# Patient Record
Sex: Male | Born: 1995 | Race: White | Hispanic: No | Marital: Single | Smoking: Former smoker
Health system: Southern US, Community
[De-identification: ages and names within clinical notes are randomized; demographics above are authoritative.]

## PROBLEM LIST (undated history)

## (undated) DIAGNOSIS — K219 Gastro-esophageal reflux disease without esophagitis: Secondary | ICD-10-CM

## (undated) DIAGNOSIS — F909 Attention-deficit hyperactivity disorder, unspecified type: Secondary | ICD-10-CM

## (undated) DIAGNOSIS — F32A Depression, unspecified: Secondary | ICD-10-CM

## (undated) DIAGNOSIS — F329 Major depressive disorder, single episode, unspecified: Secondary | ICD-10-CM

## (undated) HISTORY — DX: Gastro-esophageal reflux disease without esophagitis: K21.9

## (undated) HISTORY — PX: WISDOM TOOTH EXTRACTION: SHX21

## (undated) HISTORY — DX: Depression, unspecified: F32.A

## (undated) HISTORY — DX: Major depressive disorder, single episode, unspecified: F32.9

---
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
## 2014-02-17 ENCOUNTER — Encounter: Payer: Self-pay | Admitting: Pediatrics

## 2014-05-04 ENCOUNTER — Ambulatory Visit (INDEPENDENT_AMBULATORY_CARE_PROVIDER_SITE_OTHER): Payer: 59 | Admitting: Pediatrics

## 2014-05-04 VITALS — BP 108/70 | Ht 66.0 in | Wt 192.0 lb

## 2014-05-04 DIAGNOSIS — Z0000 Encounter for general adult medical examination without abnormal findings: Secondary | ICD-10-CM

## 2014-05-04 NOTE — Progress Notes (Signed)
   Subjective:    Patient ID: Jordan Christian, male    DOB: [birth year], 18 y.o.   MRN: [id number]  HPI nearly 18 year old male here to have forms filled out for getting his driver's license with history of substance abuse 34 years ago. He no longer is known any substance abuse medications or alcohol. He is followed by psychiatrist and is on ADHD medicine.    Review of Systems noncontributory     Objective:   Physical Exam  General:   alert and active  Skin:   no rash  Oral cavity:   moist mucous membranes, no lesion  Eyes:   sclerae white, no injected conjunctiva  Nose:  no discharge  Ears:   normal bilaterally TM  Neck:   no adenopathy  Lungs:  clear to auscultation bilaterally and no increased work of breathing  Heart:   regular rate and rhythm and no murmur           Neuro:  normal without focal findings            Assessment & Plan:  Exam for driver's license see history of present illness Plan: For spelled out for driver's license concerning physician's approval for any medical problems

## 2016-12-17 ENCOUNTER — Encounter (HOSPITAL_COMMUNITY): Payer: Self-pay | Admitting: Psychiatry

## 2016-12-17 ENCOUNTER — Ambulatory Visit (INDEPENDENT_AMBULATORY_CARE_PROVIDER_SITE_OTHER): Payer: 59 | Admitting: Psychiatry

## 2016-12-17 VITALS — BP 126/74 | HR 72 | Ht 66.0 in | Wt 212.4 lb

## 2016-12-17 DIAGNOSIS — F1290 Cannabis use, unspecified, uncomplicated: Secondary | ICD-10-CM | POA: Diagnosis not present

## 2016-12-17 DIAGNOSIS — Z818 Family history of other mental and behavioral disorders: Secondary | ICD-10-CM | POA: Diagnosis not present

## 2016-12-17 DIAGNOSIS — F39 Unspecified mood [affective] disorder: Secondary | ICD-10-CM

## 2016-12-17 DIAGNOSIS — F1099 Alcohol use, unspecified with unspecified alcohol-induced disorder: Secondary | ICD-10-CM

## 2016-12-17 DIAGNOSIS — Z79899 Other long term (current) drug therapy: Secondary | ICD-10-CM

## 2016-12-17 DIAGNOSIS — Z0000 Encounter for general adult medical examination without abnormal findings: Secondary | ICD-10-CM | POA: Diagnosis not present

## 2016-12-17 DIAGNOSIS — IMO0002 Reserved for concepts with insufficient information to code with codable children: Secondary | ICD-10-CM

## 2016-12-17 DIAGNOSIS — F17210 Nicotine dependence, cigarettes, uncomplicated: Secondary | ICD-10-CM

## 2016-12-17 DIAGNOSIS — Z811 Family history of alcohol abuse and dependence: Secondary | ICD-10-CM

## 2016-12-17 MED ORDER — NALTREXONE HCL 50 MG PO TABS: 25.0000 mg | ORAL_TABLET | Freq: Every day | ORAL | 2 refills | Status: DC

## 2016-12-17 MED ORDER — MIRTAZAPINE 15 MG PO TABS: 15.0000 mg | ORAL_TABLET | Freq: Every day | ORAL | 2 refills | Status: DC

## 2016-12-17 NOTE — Patient Instructions (Signed)
Take Naltrexone to reduce alcohol cravings EVERY MORNING  Take Remeron for sleep and mood EVERY NIGHT  Come back in about 6 weeks  Lets get you set up with a therapist intake assessment  Get the labs done in the next week or so

## 2016-12-17 NOTE — Progress Notes (Signed)
Psychiatric Initial Adult Assessment   Patient Identification: Jordan Christian MRN:  [id number] Date of Evaluation:  12/17/2016 Referral Source: self Chief Complaint:  anxiety, sleep problems Visit Diagnosis:    ICD-9-CM ICD-10-CM   1. Mood disorder (HCC) 296.90 F39 mirtazapine (REMERON) 15 MG tablet     Lipid Profile     Hemoglobin A1c     CBC     Comprehensive Metabolic Panel (CMET)     TSH  2. Alcohol use disorder (HCC) 305.00 F10.99 naltrexone (DEPADE) 50 MG tablet     Lipid Profile     Hemoglobin A1c     CBC     Comprehensive Metabolic Panel (CMET)     TSH  3. Well adult on routine health check V70.0 Z00.00 Ambulatory referral to Family Practice    History of Present Illness:  Jordan Christian is a 21 year old male with a past psychiatric history of ADHD, and multiple psychiatric hospitalizations while he was a child. He presents 40 minutes late for his appointment, and reports that he was waiting for his girlfriend to get out of her school course at [employer] because she would keep him honest.  He reports that he had been adopted at age 43, and struggled with behavioral acting out, cutting, irritability, and impulsive behaviors until he was around 35-19 years old. He had been on Seroquel as a child and gained 100 pounds within a year, so he discontinued it. He was managed on Concerta and clonidine up until about 2 years ago, but its been off of psychiatric medications for the past 2 years, as he felt like he can deal with things like his attention and focus on his own.  He presents today for worsening mood, irritability, sleep difficulties. This is in the context of being recently laid off, and having financial stressors. He lives with his girlfriend of 18 months, and they have 2 dogs, no children. She is present for the assessment, and provides collateral. He reports that after he got laid off, he started drinking heavily for the past 3 months, generally about a sixpack of beer a night. He  reports that he would also smoke marijuana daily. This helped with his sleep and anxiety. He reports that he has been clean for the past 2 weeks and has attended some groups at his church.  He is interested in both individual and group therapies in this clinic for alcohol use treatment and depression and anxiety treatment.    He reports that he continues to struggle with irritability, and being rude to his girlfriend. He sometimes throws things when he gets frustrated, but not at her. She reports that he is generally fine 90% of the time, but then he'll be irritable or cranky when it comes to work her financial stressors. He has trouble going to sleep due to rumination and worry. He is not as interested in the things he used to like such as exercising. He used to be quite physically fit and muscular, but has gained 20-30 pounds with drinking alcohol and sitting around the house more. He has low self worth given that he is not working and on unemployment. His concentration is fair, but he reports that he feels like he doesn't have anything to concentrate on, because he has nothing to do during the day. He denies any suicidal thoughts or attempts. He denies any homicidal thoughts or aggressive thoughts. He has no history of auditory visual hallucinations. Reviewing his psychiatric history, he has no history of mania  or bipolar-like episodes.  He reports that he has a biological family history of bipolar disorder and alcohol use disorder. He is interested in medications for his anxiety and sleep. We discussed the use of Remeron, the risks and benefits, and the expected effects. He agrees to initiate this medication. We also discussed the use of naltrexone for alcohol cessation and reduction of cravings. He reports that he would be very interested in starting this medication as well. We reviewed the risks and benefits of this, and expected side effects. He confirms that he is not taking any opiate her pain  medications.  He denies any marijuana use for the past 2-1/2 weeks. He reports that he does continue to have alcohol cravings over the past 2-3 weeks, and his girlfriend has had to stop him multiple times from starting to drink a beer.  Discussed therapy options in this clinic, and patient agrees to initiate individual therapy intake assessment. He is also interested in groups, but would like to start with individual therapy first. They agree to follow-up with this writer in 6 weeks.  Associated Signs/Symptoms: Depression Symptoms:  depressed mood, anhedonia, insomnia, fatigue, feelings of worthlessness/guilt, difficulty concentrating, anxiety, disturbed sleep, (Hypo) Manic Symptoms:  none Anxiety Symptoms:  Excessive Worry, Psychotic Symptoms:  none PTSD Symptoms: Negative  He denies any childhood sexual or physical trauma. He reports that his adoptive parents were always very kind to him. He reports that the only stressful. She had gone through was when his older sister, who is also adopted, but biologically his sister, attempted to kill herself  Past Psychiatric History: Childhood psychiatric history of hospitalizations and medication management for acting out behaviors and ADHD.  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  alcohol and marijuana  Consequences of Substance Abuse: Family Consequences:  Strife between him and his girlfriend Withdrawal Symptoms:   Anxiety, irritability, poor appetite  Past Medical History:  Past Medical History:  Diagnosis Date  . Depression    History reviewed. No pertinent surgical history.  Family Psychiatric History: Family psychiatric history of alcohol use disorder and bipolar disorder in mom  Family History:  Family History  Problem Relation Age of Onset  . Adopted:  Yes    Social History:   Social History   Social History  . Marital status: Single    Spouse name: N/A  . Number of children: N/A  . Years of education: N/A   Social History Main Topics  . Smoking status: Current Every Day Smoker    Types: E-cigarettes  . Smokeless tobacco: Never Used  . Alcohol use No  . Drug use: Yes    Types: Marijuana     Comment: patient no longer using  . Sexual activity: Yes    Birth control/ protection: Condom   Other Topics Concern  . None   Social History Narrative  . 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 loratadine (CLARITIN) 10 MG tablet Take 10 mg by mouth daily.    Marland Kitchen  mirtazapine (REMERON) 15 MG tablet Take 1 tablet (15 mg total) by mouth at bedtime. 30 tablet 2  . Multiple Vitamin (MULTIVITAMIN WITH MINERALS) TABS tablet Take 1 tablet by mouth daily.    . naltrexone (DEPADE) 50 MG tablet Take 0.5 tablets (25 mg total) by mouth daily. 30 tablet 2   No current facility-administered medications for this visit.     Neurologic: Headache: Negative Seizure: Negative Paresthesias:Negative  Musculoskeletal: Strength & Muscle Tone: within normal limits Gait & Station: normal Patient leans: N/A  Psychiatric Specialty Exam: Review of Systems  Constitutional: Negative.   HENT: Negative.   Respiratory: Negative.   Cardiovascular: Negative.   Gastrointestinal: Negative.   Musculoskeletal: Negative.   Neurological: Negative.  Negative for seizures.  Psychiatric/Behavioral: Positive for depression. The patient is nervous/anxious and has insomnia.     Blood  pressure 126/74, pulse 72, height  (1.676 m), weight 212 lb 6.4 oz (96.3 kg).Body mass index is 34.28 kg/m.  General Appearance: Casual, Fairly Groomed and Big beard  Eye Contact:  Good  Speech:  Clear and Coherent  Volume:  Normal  Mood:  Dysphoric  Affect:  Appropriate and Congruent  Thought Process:  Goal Directed  Orientation:  Full (Time, Place, and Person)  Thought Content:  Logical  Suicidal Thoughts:  No  Homicidal Thoughts:  No  Memory:  Immediate;   Good  Judgement:  Good  Insight:  Fair and Present  Psychomotor Activity:  Normal  Concentration:  Concentration: Fair and Attention Span: Fair  Recall:  NA  Fund of Knowledge:Good  Language: Good  Akathisia:  Negative  Handed:  Right  AIMS (if indicated):  na  Assets:  Communication Skills Desire for Improvement Financial Resources/Insurance Housing Intimacy Social Support Talents/Skills  ADL's:  Intact  Cognition: WNL  Sleep:  4-5 hours, insomnia, tossing and turning    Treatment Plan Summary: Jordan Christian is a 21 year old male with a psychiatric history of ADHD and acting out behaviors in childhood who presents today for psychiatric intake assessment. He has a recent history of alcohol use disorder, in early remission, and cannabis use disorder. In the context of significant financial stressors, recent job layoff, and cessation of alcohol and marijuana, the patient has had some insomnia, irritability, depressed mood. He denies any suicidal thoughts. No signs or symptoms of active withdrawal.  He does not have any history consistent with bipolar disorder, but reports a family history of bipolar disorder and alcohol use disorder and his biological mother.  I believe he would benefit from medication to reduce alcohol cravings, and treatment of his insomnia and mood symptoms as below, in addition to engagement in therapy.  I suspect that he would eventually benefit from initiation of a stimulant medication for ADHD,  when he returns to work. Alternatively we have also discussed potentially starting Wellbutrin for smoking cessation, attention, and mood, but at present insomnia and low mood appeared to be his biggest concerns.  1. Mood disorder (HCC)   2. Alcohol use disorder (HCC)   3. Well adult on routine health check    Initiate Remeron 15 mg nightly for sleep and mood symptoms Initiate naltrexone 25 mg daily, increase to 50 mg daily in 2 weeks as tolerated Referral for family medicine Referral for individual therapy in this clinic Encourage the patient to seek out a local AA groups to attend, and he agrees to do so Given his alcohol use and weight gain, and mood symptoms, we'll proceed with the labs as ordered  Orders Placed This Encounter  Procedures  . Lipid Profile  . Hemoglobin A1c  . CBC  . Comprehensive Metabolic Panel (CMET)  . TSH  . Ambulatory referral to Family Practice    Referral Priority:   Routine    Referral Type:   Consultation    Referral Reason:   Specialty Services Required    Requested Specialty:   Family Medicine    Number of Visits Requested:   1    Burnard Leigh, MD 4/11/20189:46 AM

## 2017-01-07 ENCOUNTER — Ambulatory Visit (HOSPITAL_COMMUNITY): Payer: Self-pay | Admitting: Licensed Clinical Social Worker

## 2017-01-28 ENCOUNTER — Encounter (HOSPITAL_COMMUNITY): Payer: Self-pay | Admitting: Psychiatry

## 2017-01-28 ENCOUNTER — Ambulatory Visit (INDEPENDENT_AMBULATORY_CARE_PROVIDER_SITE_OTHER): Payer: 59 | Admitting: Psychiatry

## 2017-01-28 DIAGNOSIS — IMO0002 Reserved for concepts with insufficient information to code with codable children: Secondary | ICD-10-CM

## 2017-01-28 DIAGNOSIS — F17210 Nicotine dependence, cigarettes, uncomplicated: Secondary | ICD-10-CM

## 2017-01-28 DIAGNOSIS — F39 Unspecified mood [affective] disorder: Secondary | ICD-10-CM

## 2017-01-28 DIAGNOSIS — F1099 Alcohol use, unspecified with unspecified alcohol-induced disorder: Secondary | ICD-10-CM

## 2017-01-28 DIAGNOSIS — F1290 Cannabis use, unspecified, uncomplicated: Secondary | ICD-10-CM | POA: Diagnosis not present

## 2017-01-28 MED ORDER — NALTREXONE HCL 50 MG PO TABS: 50.0000 mg | ORAL_TABLET | Freq: Every day | ORAL | 1 refills | Status: DC

## 2017-01-28 MED ORDER — BUPROPION HCL ER (XL) 150 MG PO TB24: 150.0000 mg | ORAL_TABLET | ORAL | 1 refills | Status: DC

## 2017-01-28 MED ORDER — MIRTAZAPINE 30 MG PO TABS: 30.0000 mg | ORAL_TABLET | Freq: Every day | ORAL | 1 refills | Status: DC

## 2017-01-28 NOTE — Progress Notes (Signed)
BH MD/PA/NP OP Progress Note  01/28/2017 10:14 AM Jordan Christian  MRN:  [id number]  Chief Complaint: adhd, some anxiety  Subjective:  Jordan Christian presents today for psychiatric follow-up. He reports that he has been sleeping much better with the Remeron, which she takes consistently at night. He continues to struggle with some poor frustration tolerance, but has not had any of the anger outbursts or throwing items. He reports that he and his girlfriend continue to bicker, particularly related to stressors of them trying to move to a new apartment or house. 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 He is proud that he is able to maintain steady employment, and his girlfriend has been very supportive of him. He had one episode of drinking 4 weeks ago, but reports that he is remained sober otherwise.  He shares that he has picked up smoking cigarettes, generally about half a pack a day. He reports that his girlfriend is pretty upset about this, and he has picked this habit up generally to deal with stress and poor focus. We spent time discussing Wellbutrin for smoking cessation, and to help with some of his ADHD symptoms. I reviewed the risks and benefits, and confirmed that he has never had any seizures. We also discussed increasing Remeron to 30 mg to further target some mood symptoms and anxiety, and he was agreeable to that. He continues on naltrexone 50 mg daily.  Visit Diagnosis:    ICD-9-CM ICD-10-CM   1. Mood disorder (HCC) 296.90 F39 mirtazapine (REMERON) 30 MG tablet  2. Alcohol use disorder (HCC) 305.00 F10.99 buPROPion (WELLBUTRIN XL) 150 MG 24 hr tablet     naltrexone (DEPADE) 50 MG tablet    Past Psychiatric History: See intake H&P for full details. Reviewed, with no updates at this time.   Past Medical History:  Past Medical History:  Diagnosis Date  . Depression    No past surgical history on  file.  Family Psychiatric History: See intake H&P for full details. Reviewed, with no updates at this time.   Family History:  Family History  Problem Relation Age of Onset  . Adopted: Yes    Social History:  Social History   Social History  . Marital status: Single    Spouse name: N/A  . Number of children: N/A  . Years of education: N/A   Social History Main Topics  . Smoking status: Current Every Day Smoker    Packs/day: 1.00    Years: 2.00    Types: Cigarettes  . Smokeless tobacco: Never Used  . Alcohol use No  . Drug use: Yes    Types: Marijuana     Comment: smokes daily  . Sexual activity: Yes    Partners: Female    Birth control/ protection: None   Other Topics Concern  . None   Social History Narrative  . None    Allergies: Not on File  Metabolic Disorder Labs: No results found for: HGBA1C, MPG No results found for: PROLACTIN No results found for: CHOL, TRIG, HDL, CHOLHDL, VLDL, LDLCALC   Current Medications: Current Outpatient Prescriptions  Medication Sig Dispense Refill  . buPROPion (WELLBUTRIN XL) 150 MG 24 hr tablet Take 1 tablet (150 mg total) by mouth every morning. 90 tablet 1  . loratadine (CLARITIN) 10 MG tablet Take 10 mg by mouth daily.    . mirtazapine (REMERON) 30 MG tablet Take 1 tablet (30 mg  total) by mouth at bedtime. 90 tablet 1  . Multiple Vitamin (MULTIVITAMIN WITH MINERALS) TABS tablet Take 1 tablet by mouth daily.    . naltrexone (DEPADE) 50 MG tablet Take 1 tablet (50 mg total) by mouth daily. 90 tablet 1   No current facility-administered medications for this visit.     Neurologic: Headache: Negative Seizure: Negative Paresthesias: Negative  Musculoskeletal: Strength & Muscle Tone: within normal limits Gait & Station: normal Patient leans: N/A  Psychiatric Specialty Exam: ROS  Blood pressure 114/68, pulse 92, height 5\' 5"  (1.651 m), weight 196 lb 9.6 oz (89.2 kg).Body mass index is 32.72 kg/m.  General  Appearance: Casual, Fairly Groomed and Wearing a shirt for work with multiple grease stains and holes  Eye Contact:  Good  Speech:  Clear and Coherent  Volume:  Normal  Mood:  Euthymic  Affect:  Congruent  Thought Process:  Coherent and Goal Directed  Orientation:  Full (Time, Place, and Person)  Thought Content: Logical   Suicidal Thoughts:  No  Homicidal Thoughts:  No  Memory:  Immediate;   Good  Judgement:  Good  Insight:  Fair  Psychomotor Activity:  Normal  Concentration:  Concentration: Fair  Recall:  Fair  Fund of Knowledge: Fair  Language: Good  Akathisia:  Negative  Handed:  Right  AIMS (if indicated):  0  Assets:  Communication Skills Desire for Improvement Financial Resources/Insurance Housing Intimacy Leisure Time Physical Health Resilience Social Support Talents/Skills Transportation Vocational/Educational  ADL's:  Intact  Cognition: WNL  Sleep:  7-9 hours   Treatment Plan Summary: Jordan FiddlerJoshua D Gibbon is a 21 year old male with a history of ADHD, depression unspecified, and a history of alcohol use disorder in early remission. He presents today for psychiatric medication management follow-up, and is in the process of establishing therapeutic care at this clinic. No acute safety issues at this time, and he appears to be making progress.  1. Mood disorder (HCC)   2. Alcohol use disorder (HCC)    Increase Remeron to 30 mg nightly to further target anxiety and mood symptoms Initiate Wellbutrin 150 mg XL for smoking cessation, ADHD symptoms, mood symptoms Continue naltrexone 50 mg daily for a reduction in alcohol cravings Return to clinic in 8 weeks Patient to schedule therapy intake with Dorann LodgeWes Swan in this office  Burnard LeighAlexander Arya Locklan Canoy, MD 01/28/2017, 10:14 AM

## 2017-02-03 ENCOUNTER — Ambulatory Visit (HOSPITAL_COMMUNITY): Payer: Self-pay | Admitting: Licensed Clinical Social Worker

## 2017-02-25 ENCOUNTER — Encounter (HOSPITAL_COMMUNITY): Payer: Self-pay | Admitting: Licensed Clinical Social Worker

## 2017-02-25 ENCOUNTER — Ambulatory Visit (INDEPENDENT_AMBULATORY_CARE_PROVIDER_SITE_OTHER): Payer: 59 | Admitting: Licensed Clinical Social Worker

## 2017-02-25 DIAGNOSIS — F1290 Cannabis use, unspecified, uncomplicated: Secondary | ICD-10-CM

## 2017-02-25 DIAGNOSIS — F330 Major depressive disorder, recurrent, mild: Secondary | ICD-10-CM

## 2017-02-25 DIAGNOSIS — F39 Unspecified mood [affective] disorder: Secondary | ICD-10-CM

## 2017-02-25 DIAGNOSIS — IMO0002 Reserved for concepts with insufficient information to code with codable children: Secondary | ICD-10-CM

## 2017-02-25 DIAGNOSIS — F1099 Alcohol use, unspecified with unspecified alcohol-induced disorder: Secondary | ICD-10-CM
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
## 2017-03-04 ENCOUNTER — Ambulatory Visit (INDEPENDENT_AMBULATORY_CARE_PROVIDER_SITE_OTHER): Payer: 59 | Admitting: Licensed Clinical Social Worker

## 2017-03-04 DIAGNOSIS — F1099 Alcohol use, unspecified with unspecified alcohol-induced disorder: Secondary | ICD-10-CM | POA: Diagnosis not present

## 2017-03-04 DIAGNOSIS — F330 Major depressive disorder, recurrent, mild: Secondary | ICD-10-CM

## 2017-03-04 DIAGNOSIS — F1290 Cannabis use, unspecified, uncomplicated: Secondary | ICD-10-CM | POA: Diagnosis not present

## 2017-03-04 DIAGNOSIS — IMO0002 Reserved for concepts with insufficient information to code with codable children: Secondary | ICD-10-CM

## 2017-03-05 ENCOUNTER — Encounter (HOSPITAL_COMMUNITY): Payer: Self-pay | Admitting: Licensed Clinical Social Worker

## 2017-03-05 NOTE — Progress Notes (Signed)
   THERAPIST PROGRESS NOTE  Session Time: 4-4:45pm  Participation Level: Active  Behavioral Response: Casual and NeatAlertEuthymic  Type of Therapy: Individual Therapy  Treatment Goals addressed: Diagnosis: AUD, MDD  Interventions: CBT and Motivational Interviewing  Summary: Jordan Christian is a 21 y.o. male who presents with agitation at home, frustration with his gf of 2 years, loss of interest in usual pleasures, and daily marijuana use. Pt reported he worked on his goal of reducing his alcohol consumption the past week to 2-3 beers on one night of the week when they went to a casino to celebrate his GF birthday. Pt reported the beers made him "feel sick to his stomach" and he believes he "won't have a problem not drinking anymore". Pt also reported he limited his marijuana use over the past week and did not use for 5 days in a row, though he plans to smoke tonight. Pt denied any cravings or withdrawal symptoms over the past week. He reported his GF is upset with him bc he talks on the phone w/ a male friend from high school. He admits he used to be romantically interested in the male but "her father wont let me date her". Counselor helped pt practice mindfulness with a [place] singing bowl to learn to focus his attention, gain awareness of his mood, and learn to predict angry and outburst behavior. Pt was open and compliant during session. He stated he was not taking his medications as px and frequently skipped his Welbutrin. Counselor offered feedback for linking medication-taking to another morning activity like showering/coffee. Pt stated he would try it.  Suicidal/Homicidal: Nowithout intent/plan  Therapist Response: Counselor used MI and Mindfulness based CBT to build pt awareness and motivation to sustain future changes. Pt identified strong reasons to change and appears motivated. He does lack insight into his behaviors such as talking a girl other than his GF to get his emotional  needs met. He does not "see it as a problem". Counselor pointed out GF might have reason to be concerned, and pt said "i see what you mean". Pt appears eager to put into practice the coping skills he is acquiring in tx.   Plan: Return again in 2 weeks.  Diagnosis:    ICD-10-CM   1. Alcohol use disorder ([place]) F10.99   2. Marijuana use, continuous F12.90   3. Mild episode of recurrent major depressive disorder [facility]) F33.0       Archie Balboa, LPCA 03/05/2017

## 2017-03-12 ENCOUNTER — Ambulatory Visit (INDEPENDENT_AMBULATORY_CARE_PROVIDER_SITE_OTHER): Payer: 59 | Admitting: Family Medicine

## 2017-03-12 ENCOUNTER — Encounter: Payer: Self-pay | Admitting: Gastroenterology

## 2017-03-12 ENCOUNTER — Encounter: Payer: Self-pay | Admitting: Family Medicine

## 2017-03-12 VITALS — BP 128/80 | HR 85 | Temp 98.7°F | Resp 16 | Ht 65.63 in | Wt 193.0 lb

## 2017-03-12 DIAGNOSIS — F17200 Nicotine dependence, unspecified, uncomplicated: Secondary | ICD-10-CM | POA: Diagnosis not present

## 2017-03-12 DIAGNOSIS — R1114 Bilious vomiting: Secondary | ICD-10-CM

## 2017-03-12 DIAGNOSIS — R1013 Epigastric pain: Secondary | ICD-10-CM | POA: Diagnosis not present

## 2017-03-12 DIAGNOSIS — K5900 Constipation, unspecified: Secondary | ICD-10-CM | POA: Diagnosis not present

## 2017-03-12 DIAGNOSIS — G8929 Other chronic pain: Secondary | ICD-10-CM

## 2017-03-12 NOTE — Patient Instructions (Addendum)
IF you received an x-ray today, you will receive an invoice from [facility]. Please contact [facility] at [phone] with questions or concerns regarding your invoice.   IF you received labwork today, you will receive an invoice from [facility]. Please contact [facility] at [phone] with questions or concerns regarding your invoice.   Our billing staff will not be able to assist you with questions regarding bills from these companies.  You will be contacted with the lab results as soon as they are available. The fastest way to get your results is to activate your My Chart account. Instructions are located on the last page of this paperwork. If you have not heard from [place] regarding the results in 2 weeks, please contact this office.     Helicobacter Pylori Infection Helicobacter pylori infection is an infection in the stomach that is caused by the Helicobacter pylori (H. pylori) bacteria. This type of bacteria often lives in the lining of the stomach. The infection can cause ulcers and irritation (gastritis) in some people. It is the most common cause of ulcers in the stomach (gastric ulcer) and in the upper part of the intestine (duodenal ulcer). Having this infection may also increase the risk of stomach cancer and a type of white blood cell cancer (lymphoma) that affects the stomach. What are the causes? H. pylori is a type of bacteria that is often found in the stomachs of healthy people. The bacteria may be passed from person to person through contact with stool or saliva. It is not known why some people develop ulcers, gastritis, or cancer from the infection. What increases the risk? This condition is more likely to develop in people who:  Have family members with the infection.  Live with many other people, such as in a dormitory.  Are of African, Hispanic, or Asian descent.  What are the signs or symptoms? Most people with this infection do not have  symptoms. If you do have symptoms, they may include:  Heartburn.  Stomach pain.  Nausea.  Vomiting.  Blood-tinged vomit.  Loss of appetite.  Bad breath.  How is this diagnosed? This condition may be diagnosed based on your symptoms, a physical exam, and various tests. Tests may include:  Blood tests or stool tests to check for the proteins (antibodies) that your body may produce in response to the bacteria. These tests are the best way to confirm the diagnosis.  A breath test to check for the type of gas that the H. pylori bacteria release after breaking down a substance called urea. For the test, you are asked to drink urea. This test is often done after treatment in order to find out if the treatment worked.  A procedure in which a thin, flexible tube with a tiny camera at the end is placed into your stomach and upper intestine (upper endoscopy). Your health care provider may also take tissue samples (biopsy) to test for H. pylori and cancer.  How is this treated? Treatment for this condition usually involves taking a combination of medicines (triple therapy) for a couple of weeks. Triple therapy includes one medicine to reduce the acid in your stomach and two types of antibiotic medicines. Many drug combinations have been approved for treatment. Treatment usually kills the H. pylori and reduces your risk of cancer. You may need to be tested for H. pylori again after treatment. In some cases, the treatment may need to be repeated. Follow these instructions at home:  Take over-the-counter  and prescription medicines only as told by your health care provider.  Take your antibiotics as told by your health care provider. Do not stop taking the antibiotics even if you start to feel better.  You can do all your usual activities and eat what you usually do.  Take steps to prevent future infections: ? Wash your hands often. ? Make sure the food you eat has been properly  prepared. ? Drink water only from clean sources.  Keep all follow-up visits as told by your health care provider. This is important. Contact a health care provider if:  Your symptoms do not get better.  Your symptoms return after treatment. This information is not intended to replace advice given to you by your health care provider. Make sure you discuss any questions you have with your health care provider. Document Released: 12/17/2015 Document Revised: 01/31/2016 Document Reviewed: 09/06/2014 Elsevier Interactive Patient Education  2018 [employer]    Constipation, Adult Constipation is when a person has fewer bowel movements in a week than normal, has difficulty having a bowel movement, or has stools that are dry, hard, or larger than normal. Constipation may be caused by an underlying condition. It may become worse with age if a person takes certain medicines and does not take in enough fluids. Follow these instructions at home: Eating and drinking   Eat foods that have a lot of fiber, such as fresh fruits and vegetables, whole grains, and beans.  Limit foods that are high in fat, low in fiber, or overly processed, such as french fries, hamburgers, cookies, candies, and soda.  Drink enough fluid to keep your urine clear or pale yellow. General instructions  Exercise regularly or as told by your health care provider.  Go to the restroom when you have the urge to go. Do not hold it in.  Take over-the-counter and prescription medicines only as told by your health care provider. These include any fiber supplements.  Practice pelvic floor retraining exercises, such as deep breathing while relaxing the lower abdomen and pelvic floor relaxation during bowel movements.  Watch your condition for any changes.  Keep all follow-up visits as told by your health care provider. This is important. Contact a health care provider if:  You have pain that gets worse.  You have a  fever.  You do not have a bowel movement after 4 days.  You vomit.  You are not hungry.  You lose weight.  You are bleeding from the anus.  You have thin, pencil-like stools. Get help right away if:  You have a fever and your symptoms suddenly get worse.  You leak stool or have blood in your stool.  Your abdomen is bloated.  You have severe pain in your abdomen.  You feel dizzy or you faint. This information is not intended to replace advice given to you by your health care provider. Make sure you discuss any questions you have with your health care provider. Document Released: 05/23/2004 Document Revised: 03/14/2016 Document Reviewed: 02/13/2016 Elsevier Interactive Patient Education  2017 [employer]

## 2017-03-12 NOTE — Progress Notes (Signed)
Chief Complaint  Patient presents with  . GI Problem    onset: 2 1/2 years, Feels like gas but doesn't go away, emesis and nausea sometimes in the mornings, emesis three times today, paine is sometimes concentreated in the top of the sotmach and other times it feels like it is at the ottom of the stomach.  Pain level 6/10 and constant ache.  Sometimes no episode for 2-3 weeks but when it comes back it is constant.    HPI   Pt reports that he has a chronic history of GI upset for about 2.5 years He reports that he gets flares of abdominal pain that is painful about 6/10 He reports that it feels like gas but doesn't go away He also gets emesis or dry heaves as soon as he wakes up before he has eaten. He reports that today he had 3 episodes of emesis He tried some antacids overnight He ate a small snack this morning at 8:30am that came down He is having reflux symptoms presently- burning in the epigastric area, bile taste He states that he smokes cigarettes about a pack a day for 3.5 years He also vapes  He smokes marijuana He also had heavy alcohol use until a month ago   Past Medical History:  Diagnosis Date  . Depression     Current Outpatient Prescriptions  Medication Sig Dispense Refill  . buPROPion (WELLBUTRIN XL) 150 MG 24 hr tablet Take 1 tablet (150 mg total) by mouth every morning. 90 tablet 1  . loratadine (CLARITIN) 10 MG tablet Take 10 mg by mouth daily.    . mirtazapine (REMERON) 30 MG tablet Take 1 tablet (30 mg total) by mouth at bedtime. 90 tablet 1  . Multiple Vitamin (MULTIVITAMIN WITH MINERALS) TABS tablet Take 1 tablet by mouth daily.     No current facility-administered medications for this visit.     Allergies: No Known Allergies  History reviewed. No pertinent surgical history.  Social History   Social History  . Marital status: Single    Spouse name: N/A  . Number of children: N/A  . Years of education: N/A   Social History Main Topics  .  Smoking status: Current Every Day Smoker    Packs/day: 1.00    Years: 3.50    Types: Cigarettes  . Smokeless tobacco: Never Used  . Alcohol use 1.8 oz/week    3 Cans of beer per week  . Drug use: Yes    Types: Marijuana     Comment: smokes daily  . Sexual activity: Yes    Partners: Female    Birth control/ protection: None   Other Topics Concern  . None   Social History Narrative  . None    ROS See hpi  Objective: Vitals:   03/12/17 1052  BP: 128/80  Pulse: 85  Resp: 16  Temp: 98.7 F (37.1 C)  TempSrc: Oral  SpO2: 98%  Weight: 193 lb (87.5 kg)  Height: 5' 5.63" (1.667 m)    Physical Exam  Constitutional: He is oriented to person, place, and time. He appears well-developed and well-nourished.  HENT:  Head: Normocephalic and atraumatic.  Nose: Nose normal.  Mouth/Throat: Oropharynx is clear and moist.  Cardiovascular: Normal rate, regular rhythm and normal heart sounds.   Pulmonary/Chest: Effort normal and breath sounds normal. No respiratory distress. He has no wheezes.  Abdominal: Soft. Bowel sounds are normal. He exhibits no distension.    Neurological: He is alert and oriented to person,  place, and time.    Assessment and Plan Jordan Christian was seen today for gi problem.  Diagnoses and all orders for this visit:  Abdominal pain, chronic, epigastric- likely GERD  Multifactorial Vomiting could lead to reflux gerd could cause nausea Smoking can be causing his gerd Marijuana use can cause vomiting -     Cancel: H. pylori breath test -     Cancel: CBC with Differential/Platelet -     Comprehensive metabolic panel; Future -     H. pylori breath test; Future -     CBC with Differential/Platelet; Future  Tobacco use disorder- pt enrolled in smoking cessation classes and is on wellbutrin Follow up with smoking cessation Discussed that vaping is as addictive  Bilious vomiting with nausea- could be due to marijuana use -     Comprehensive metabolic panel;  Future  Constipation, unspecified constipation type -  Discussed ways to improve his diet to decrease constipation Advised metamucil tid before meals and plenty of water after meals Also increase exercise   A total of 30 minutes were spent face-to-face with the patient during this encounter and over half of that time was spent on counseling and coordination of care.  Ellis Mehaffey A Ivett L[place]

## 2017-03-14 ENCOUNTER — Ambulatory Visit (INDEPENDENT_AMBULATORY_CARE_PROVIDER_SITE_OTHER): Payer: 59 | Admitting: Family Medicine

## 2017-03-14 DIAGNOSIS — R1013 Epigastric pain: Secondary | ICD-10-CM

## 2017-03-14 DIAGNOSIS — G8929 Other chronic pain: Secondary | ICD-10-CM

## 2017-03-14 DIAGNOSIS — R1114 Bilious vomiting: Secondary | ICD-10-CM

## 2017-03-14 NOTE — Progress Notes (Signed)
Pt not seen by provider.

## 2017-03-15 LAB — COMPREHENSIVE METABOLIC PANEL
ALT: 12 IU/L (ref 0–44)
AST: 15 IU/L (ref 0–40)
Albumin/Globulin Ratio: 2 (ref 1.2–2.2)
Albumin: 4.7 g/dL (ref 3.5–5.5)
Alkaline Phosphatase: 68 IU/L (ref 39–117)
BUN/Creatinine Ratio: 8 — ABNORMAL LOW (ref 9–20)
BUN: 7 mg/dL (ref 6–20)
Bilirubin Total: 0.2 mg/dL (ref 0.0–1.2)
CALCIUM: 9.3 mg/dL (ref 8.7–10.2)
CHLORIDE: 101 mmol/L (ref 96–106)
CO2: 26 mmol/L (ref 20–29)
Creatinine, Ser: 0.92 mg/dL (ref 0.76–1.27)
GFR, EST AFRICAN AMERICAN: 137 mL/min/{1.73_m2} (ref >59)
GFR, EST NON AFRICAN AMERICAN: 118 mL/min/{1.73_m2} (ref >59)
GLUCOSE: 92 mg/dL (ref 65–99)
Globulin, Total: 2.3 g/dL (ref 1.5–4.5)
Potassium: 5.3 mmol/L — ABNORMAL HIGH (ref 3.5–5.2)
Sodium: 141 mmol/L (ref 134–144)
TOTAL PROTEIN: 7 g/dL (ref 6.0–8.5)

## 2017-03-15 LAB — CBC WITH DIFFERENTIAL/PLATELET
BASOS: 0 %
Basophils Absolute: 0 10*3/uL (ref 0.0–0.2)
EOS (ABSOLUTE): 0.3 10*3/uL (ref 0.0–0.4)
Eos: 5 %
Hematocrit: 43.8 % (ref 37.5–51.0)
Hemoglobin: 14.4 g/dL (ref 13.0–17.7)
IMMATURE GRANS (ABS): 0 10*3/uL (ref 0.0–0.1)
IMMATURE GRANULOCYTES: 0 %
Lymphocytes Absolute: 1.6 10*3/uL (ref 0.7–3.1)
Lymphs: 27 %
MCH: 29.4 pg (ref 26.6–33.0)
MCHC: 32.9 g/dL (ref 31.5–35.7)
MCV: 90 fL (ref 79–97)
Monocytes Absolute: 0.5 10*3/uL (ref 0.1–0.9)
Monocytes: 8 %
NEUTROS PCT: 60 %
Neutrophils Absolute: 3.4 10*3/uL (ref 1.4–7.0)
PLATELETS: 190 10*3/uL (ref 150–379)
RBC: 4.89 x10E6/uL (ref 4.14–5.80)
RDW: 13.7 % (ref 12.3–15.4)
WBC: 5.7 10*3/uL (ref 3.4–10.8)

## 2017-03-16 LAB — H. PYLORI BREATH TEST: H pylori Breath Test: NEGATIVE

## 2017-03-16 MED ORDER — OMEPRAZOLE 20 MG PO CPDR: 20.0000 mg | DELAYED_RELEASE_CAPSULE | Freq: Every day | ORAL | 3 refills | Status: DC

## 2017-03-16 NOTE — Addendum Note (Signed)
Addended by: Collie SiadSTALLINGS, Boris Engelmann A on: 03/16/2017 12:06 PM   Modules accepted: Orders

## 2017-03-18 ENCOUNTER — Ambulatory Visit (INDEPENDENT_AMBULATORY_CARE_PROVIDER_SITE_OTHER): Payer: 59 | Admitting: Licensed Clinical Social Worker

## 2017-03-18 DIAGNOSIS — F330 Major depressive disorder, recurrent, mild: Secondary | ICD-10-CM

## 2017-03-18 DIAGNOSIS — F1099 Alcohol use, unspecified with unspecified alcohol-induced disorder: Secondary | ICD-10-CM | POA: Diagnosis not present

## 2017-03-18 DIAGNOSIS — IMO0002 Reserved for concepts with insufficient information to code with codable children: Secondary | ICD-10-CM

## 2017-03-18 DIAGNOSIS — F1290 Cannabis use, unspecified, uncomplicated: Secondary | ICD-10-CM | POA: Diagnosis not present

## 2017-03-19 ENCOUNTER — Encounter (HOSPITAL_COMMUNITY): Payer: Self-pay | Admitting: Licensed Clinical Social Worker

## 2017-03-19 NOTE — Progress Notes (Signed)
   THERAPIST PROGRESS NOTE  Session Time: 4:10-5pm  Participation Level: Active  Behavioral Response: Casual and dressed in work clothesAlertDepressed  Type of Therapy: Individual Therapy  Treatment Goals addressed: Diagnosis: AUD, MDD  Interventions: CBT and Motivational Interviewing  Summary: Jordan FiddlerJoshua D Christian is a 21 y.o. male who presents seeking help for his excessive drinking, depression, and lack of sleep. He reports he has a new job which requires him to wake up at 4:30am which he hates. He admits he often sleeps through his alarm and then is frustrated for the rest of the day. He reports little things are "setting him off" and causing him frustration. When questioned further he stated, "nearly anything" can put me in a bad mood. Pt appeared tired and somewhat despondent towards counselor. Pt focused on many external stressors that contribute to his anxiety and depression. Pt admitted he "still cannot remember to take his medications daily". Pt and counselor discussed strategies for becoming more flexible with his daily routine as a coping skills for managing unplanned stress.  Pt also discussed having significant stomach problems and reported he plans to have his stomach looked at soon.   Suicidal/Homicidal: Nowithout intent/plan  Therapist Response: Counselor used reflection and validation to help pt gain insight into his recent stressors. Counselor encouraged pt to work on the things he can control such as his use of time, ability to take his medications, feed himself, and take time for himself. Pt and counselor discussed times when pt could relax between drives, meditate by focusing on his breathing for 1-2 min, and by simplifying his dinner prep process so he can get to bed by 9pm daily. Counselor encouraged pt to put his medications in his tool bag so that he will remember to take them daily.   Plan: Return again in 1 weeks.  Diagnosis:    ICD-10-CM   1. Mild episode of recurrent  major depressive disorder (HCC) F33.0   2. Alcohol use disorder (HCC) F10.99   3. Marijuana use, continuous F12.90        Margo CommonWesley E Randee Huston, LPCA 03/19/2017

## 2017-03-25 ENCOUNTER — Ambulatory Visit (INDEPENDENT_AMBULATORY_CARE_PROVIDER_SITE_OTHER): Payer: 59 | Admitting: Licensed Clinical Social Worker

## 2017-03-25 DIAGNOSIS — F1099 Alcohol use, unspecified with unspecified alcohol-induced disorder: Secondary | ICD-10-CM

## 2017-03-25 DIAGNOSIS — F39 Unspecified mood [affective] disorder: Secondary | ICD-10-CM | POA: Diagnosis not present

## 2017-03-25 DIAGNOSIS — F330 Major depressive disorder, recurrent, mild: Secondary | ICD-10-CM | POA: Diagnosis not present

## 2017-03-25 DIAGNOSIS — IMO0002 Reserved for concepts with insufficient information to code with codable children: Secondary | ICD-10-CM

## 2017-03-25 DIAGNOSIS — F1290 Cannabis use, unspecified, uncomplicated: Secondary | ICD-10-CM

## 2017-03-30 ENCOUNTER — Encounter (HOSPITAL_COMMUNITY): Payer: Self-pay | Admitting: Licensed Clinical Social Worker

## 2017-03-30 NOTE — Progress Notes (Signed)
   THERAPIST PROGRESS NOTE  Session Time: 4-4:45  Participation Level: Active  Behavioral Response: Casual and work clothesAlertAnxious  Type of Therapy: Individual Therapy  Treatment Goals addressed: Anxiety and Coping  Interventions: CBT and Other: Mindfulness based CBT  Summary: Jordan FiddlerJoshua D Christian is a 21 y.o. male who presents seeking ongoing help for his anxiety, depression, and substance abuse. Pt reports he was triggered to want to buy a 1/5 of liquor over the weekend but did not do it. Pt states he continues to get into arguments w/ his GF over minor things. He presents today as very anxious, w/ infrequent eye contact, tense facial expression, and sarcastic verbalizations about "his shit day he's having". Pt reports he woke up late for work the past 2 days and has been angry all day since. He admits his GF turns off his alarm in the morning which causes him to be late. Counselor provided feedback on changing his routine in order to get up at the correct time. Pt agreed this was a good idea. Pt was also encouraged to continue taking his medications as px, which he denied he was doing due to "always being rushed out the door".    Suicidal/Homicidal: Nowithout intent/plan  Therapist Response: Counselor used MI and MbCBT to help pt learn coping skills for breathing through anxiety, changing his feelings, and increasing his distress tolerance. Pt was given frank feedback about how counseling could not work w/o pt's genuine attempt at putting interventions into action. Pt agreed and seemed angry and stressed at himself. Counselor reflected that perhaps pt's anger at "the world and his GF" was actually anger towards his own seeming inability to change. Pt appeared curious and said "he'd have to think on that one".   Plan: Return again in 1 week.  Diagnosis:    ICD-10-CM   1. Mild episode of recurrent major depressive disorder (HCC) F33.0   2. Alcohol use disorder (HCC) F10.99   3. Marijuana  use, continuous F12.90   4. Mood disorder [facility](HCC) F39        Jordan Christian, LPCA 03/30/2017

## 2017-04-01 ENCOUNTER — Ambulatory Visit (INDEPENDENT_AMBULATORY_CARE_PROVIDER_SITE_OTHER): Payer: 59 | Admitting: Licensed Clinical Social Worker

## 2017-04-01 ENCOUNTER — Encounter (HOSPITAL_COMMUNITY): Payer: Self-pay | Admitting: Licensed Clinical Social Worker

## 2017-04-01 DIAGNOSIS — F330 Major depressive disorder, recurrent, mild: Secondary | ICD-10-CM

## 2017-04-01 DIAGNOSIS — F1290 Cannabis use, unspecified, uncomplicated: Secondary | ICD-10-CM

## 2017-04-01 DIAGNOSIS — IMO0002 Reserved for concepts with insufficient information to code with codable children: Secondary | ICD-10-CM

## 2017-04-01 DIAGNOSIS — F1099 Alcohol use, unspecified with unspecified alcohol-induced disorder: Secondary | ICD-10-CM

## 2017-04-01 NOTE — Progress Notes (Signed)
   THERAPIST PROGRESS NOTE  Session Time: 4-4:45  Participation Level: Active  Behavioral Response: CasualAlertEuthymic  Type of Therapy: Individual Therapy  Treatment Goals addressed: Anxiety and Diagnosis: MDD  Interventions: CBT and Motivational Interviewing  Summary: Melina FiddlerJoshua D Jutras is a 21 y.o. male who presents seeking help with his stress, anxiety, and worry about his relationship with his GF of 2 yrs and his job as an Personnel officerelectrician. Pt states he has had a good week w/o any significant anger outbursts, anxiety attacks, or crying spells. He states he is taking his medication each morning during the weekdays but forgets about it on weekends. He was happy to report he has not been late for work since our last session. Counselor and pt discuss strategies for improving communication and "fair fighting rules".   Suicidal/Homicidal: Nowithout intent/plan  Therapist Response: Counselor used open questions, genuine positive regard, and reflection. Counselor discussed pt's experience of his anger and stress. Counselor worked to help pt identify his emotions towards his GF and coworkers. Pt was provided handout on communication and "fair fighting rules".   Plan: Return again in 1 weeks.  Diagnosis:    ICD-10-CM   1. Mild episode of recurrent major depressive disorder (HCC) F33.0   2. Alcohol use disorder (HCC) F10.99   3. Marijuana use, continuous F12.90       Margo CommonWesley E Earnest Thalman, LPCA 04/01/2017

## 2017-04-02 ENCOUNTER — Ambulatory Visit (INDEPENDENT_AMBULATORY_CARE_PROVIDER_SITE_OTHER): Payer: 59 | Admitting: Psychiatry

## 2017-04-02 ENCOUNTER — Encounter (HOSPITAL_COMMUNITY): Payer: Self-pay | Admitting: Psychiatry

## 2017-04-02 VITALS — BP 118/70 | HR 74 | Ht 65.5 in | Wt 188.4 lb

## 2017-04-02 DIAGNOSIS — F330 Major depressive disorder, recurrent, mild: Secondary | ICD-10-CM

## 2017-04-02 DIAGNOSIS — F1290 Cannabis use, unspecified, uncomplicated: Secondary | ICD-10-CM

## 2017-04-02 DIAGNOSIS — F39 Unspecified mood [affective] disorder: Secondary | ICD-10-CM | POA: Diagnosis not present

## 2017-04-02 DIAGNOSIS — F1099 Alcohol use, unspecified with unspecified alcohol-induced disorder: Secondary | ICD-10-CM

## 2017-04-02 DIAGNOSIS — IMO0002 Reserved for concepts with insufficient information to code with codable children: Secondary | ICD-10-CM

## 2017-04-02 DIAGNOSIS — F17210 Nicotine dependence, cigarettes, uncomplicated: Secondary | ICD-10-CM

## 2017-04-02 MED ORDER — NALTREXONE HCL 50 MG PO TABS: 50.0000 mg | ORAL_TABLET | Freq: Every day | ORAL | 1 refills | Status: DC

## 2017-04-02 MED ORDER — MIRTAZAPINE 45 MG PO TABS: 45.0000 mg | ORAL_TABLET | Freq: Every day | ORAL | 1 refills | Status: DC

## 2017-04-02 MED ORDER — BUPROPION HCL ER (XL) 300 MG PO TB24: 300.0000 mg | ORAL_TABLET | ORAL | 1 refills | Status: DC

## 2017-04-02 NOTE — Progress Notes (Signed)
BH MD/PA/NP OP Progress Note  04/02/2017 4:02 PM Melina FiddlerJoshua D Albergo  MRN:  [id number]  Chief Complaint: med follow-up Subjective: Melina FiddlerJoshua D Langsam has continued to work with his individual therapist in conflict and anxiety management. He presents today for med management follow-up after we recently started Wellbutrin.  He reports that the Wellbutrin has been well-tolerated, he is learning to remember to take it more consistently. He does not feel like it gives him any nausea, anxiety, upset stomach or tremulousness. Believe that it helps with his focus and energy during the day, and helps his mood. He reports that he is much better taking Remeron 30 mg nightly on a consistent basis. He reports that things with him and his girlfriend have been pretty good, except for some conflict with her part of the family.  He has been able to get along with his colleagues at work fairly well with this new position. The most significant challenge for him has been being able to get up and get to work on time for 6 AM start time. He is working on setting an alarm and being more consistent at not hitting the snooze button.    To maximize his antidepressant dose and effect from Remeron, we agreed to increase to 45 mg nightly. We also agreed to increase Wellbutrin to 300 mg nightly. He reports that he is drinking alcohol about one to 2 nights per week, and it tends to be just 1-2 beers. He reports that if he starts to have cravings due to stress or anxiety, he takes the naltrexone and it helps reduce his cravings. He reports that he is still smoking marijuana approximately every night, and knows that he needs to start working on tapering this down.  Visit Diagnosis:    ICD-10-CM   1. Mild episode of recurrent major depressive disorder (HCC) F33.0   2. Alcohol use disorder (HCC) F10.99 buPROPion (WELLBUTRIN XL) 300 MG 24 hr tablet  3. Marijuana use, continuous F12.90   4. Mood disorder (HCC) F39 mirtazapine (REMERON) 45 MG  tablet    Past Psychiatric History: See intake H&P for full details. Reviewed, with no updates at this time.  Past Medical History:  Past Medical History:  Diagnosis Date  . Depression    History reviewed. No pertinent surgical history.  Family Psychiatric History: See intake H&P for full details. Reviewed, with no updates at this time.   Family History:  Family History  Problem Relation Age of Onset  . Adopted: Yes    Social History:  Social History   Social History  . Marital status: Single    Spouse name: N/A  . Number of children: N/A  . Years of education: N/A   Social History Main Topics  . Smoking status: Current Some Day Smoker    Packs/day: 0.25    Years: 3.50    Types: Cigarettes  . Smokeless tobacco: Never Used  . Alcohol use 0.6 - 1.2 oz/week    1 - 2 Cans of beer per week     Comment: weekly  . Drug use: Yes    Types: Marijuana     Comment: smokes daily  . Sexual activity: Yes    Partners: Female    Birth control/ protection: None   Other Topics Concern  . None   Social History Narrative  . None    Allergies: No Known Allergies  Metabolic Disorder Labs: No results found for: HGBA1C, MPG No results found for: PROLACTIN No results found for: CHOL,  TRIG, HDL, CHOLHDL, VLDL, LDLCALC   Current Medications: Current Outpatient Prescriptions  Medication Sig Dispense Refill  . buPROPion (WELLBUTRIN XL) 300 MG 24 hr tablet Take 1 tablet (300 mg total) by mouth every morning. 90 tablet 1  . loratadine (CLARITIN) 10 MG tablet Take 10 mg by mouth daily.    . mirtazapine (REMERON) 45 MG tablet Take 1 tablet (45 mg total) by mouth at bedtime. 90 tablet 1  . Multiple Vitamin (MULTIVITAMIN WITH MINERALS) TABS tablet Take 1 tablet by mouth daily.    . naltrexone (DEPADE) 50 MG tablet Take 50 mg by mouth daily.  1  . omeprazole (PRILOSEC) 20 MG capsule Take 1 capsule (20 mg total) by mouth daily. 30 capsule 3   No current facility-administered  medications for this visit.     Neurologic: Headache: Negative Seizure: Negative Paresthesias: Negative  Musculoskeletal: Strength & Muscle Tone: within normal limits Gait & Station: normal Patient leans: N/A  Psychiatric Specialty Exam: ROS  Blood pressure 118/70, pulse 74, height 5' 5.5" (1.664 m), weight 188 lb 6.4 oz (85.5 kg).Body mass index is 30.87 kg/m.  General Appearance: Casual and Well Groomed  Eye Contact:  Good  Speech:  Clear and Coherent  Volume:  Normal  Mood:  Euthymic  Affect:  Appropriate and Congruent  Thought Process:  Coherent and Goal Directed  Orientation:  Full (Time, Place, and Person)  Thought Content: Logical   Suicidal Thoughts:  No  Homicidal Thoughts:  No  Memory:  Immediate;   Good  Judgement:  Fair  Insight:  Fair  Psychomotor Activity:  Normal  Concentration:  Attention Span: Good  Recall:  Good  Fund of Knowledge: Good  Language: Good  Akathisia:  Negative  Handed:  Right  AIMS (if indicated):  0  Assets:  Communication Skills Desire for Improvement Financial Resources/Insurance Housing Intimacy Leisure Time Physical Health Resilience Social Support Talents/Skills Transportation Vocational/Educational  ADL's:  Intact  Cognition: WNL  Sleep:  7-8 hours     Treatment Plan Summary: Melina FiddlerJoshua D Halt is a 21 year old male with a history of major depressive disorder, alcohol use disorder, cannabis use disorder, and childhood ADHD who presents today for med management follow-up. He is making progress in significant reduction of alcohol use, and working well with his therapist in this clinic. He continues to have ongoing marijuana use which we continue to discuss. We'll proceed as below and follow up in 3 months.  1. Mild episode of recurrent major depressive disorder (HCC)   2. Alcohol use disorder (HCC)   3. Marijuana use, continuous   4. Mood disorder (HCC)    Increase Wellbutrin to 300 mg XL daily Increase Remeron to 45 mg  nightly Continue to encourage alcohol abstinence and marijuana reduction and abstinence Continue naltrexone 50 mg daily for alcohol cravings Return to clinic in 3 months Continue in therapy with Leontine LocketWes  Keya Wynes Arya Trannie Bardales, MD 04/02/2017, 4:02 PM

## 2017-04-08 ENCOUNTER — Ambulatory Visit: Payer: 59 | Admitting: Family Medicine

## 2017-04-08 ENCOUNTER — Encounter (HOSPITAL_COMMUNITY): Payer: Self-pay | Admitting: Licensed Clinical Social Worker

## 2017-04-08 ENCOUNTER — Ambulatory Visit (INDEPENDENT_AMBULATORY_CARE_PROVIDER_SITE_OTHER): Payer: 59 | Admitting: Licensed Clinical Social Worker

## 2017-04-08 DIAGNOSIS — F1290 Cannabis use, unspecified, uncomplicated: Secondary | ICD-10-CM | POA: Diagnosis not present

## 2017-04-08 DIAGNOSIS — F1099 Alcohol use, unspecified with unspecified alcohol-induced disorder: Secondary | ICD-10-CM

## 2017-04-08 DIAGNOSIS — F330 Major depressive disorder, recurrent, mild: Secondary | ICD-10-CM

## 2017-04-08 DIAGNOSIS — F39 Unspecified mood [affective] disorder: Secondary | ICD-10-CM | POA: Diagnosis not present

## 2017-04-08 DIAGNOSIS — IMO0002 Reserved for concepts with insufficient information to code with codable children: Secondary | ICD-10-CM

## 2017-04-08 NOTE — Progress Notes (Signed)
   THERAPIST PROGRESS NOTE  Session Time: 4-4:45pm  Participation Level: Active  Behavioral Response: Casual and Fairly GroomedAlertEuthymic  Type of Therapy: Individual Therapy  Treatment Goals addressed: Diagnosis: MDD  Interventions: CBT, Strength-based, Supportive and Reframing  Summary: Jordan FiddlerJoshua D Christian is a 21 y.o. male who presents seeking coping skills for his MDD. Pt reports he had a positive week attending a concert, taking time off from work, and having a "family meeting" w/ his GF's family. Pt reported there were stressors such as his refrigerator breaking and the ongoing financial difficulties he has, but overall he as positive and upbeat. He reports he is working on utilizing the CBT based commuincation skills the counselor has been teaching in previous sessions. Pt admitted his anger is much less intense and he is better able to get through anger w/o "blowing up or yelling at his GF". Pt stated his medicines appear to be working well and he is sleeping better.   Suicidal/Homicidal: Nowithout intent/plan  Therapist Response: Pt reported he enjoys session, counselor's style, and the emphasis on processing his feelings and learning new coping skills. Pt was encouraged by counselor to inquire w/ his GF about changes she has noticed in him and future goals they could work together on.   Plan: Return again in 1 weeks.  Diagnosis:    ICD-10-CM   1. Mild episode of recurrent major depressive disorder (HCC) F33.0   2. Alcohol use disorder (HCC) F10.99   3. Marijuana use, continuous F12.90   4. Mood disorder [facility](HCC) F39        Margo CommonWesley E Bj Morlock 04/08/2017

## 2017-04-15 ENCOUNTER — Ambulatory Visit (INDEPENDENT_AMBULATORY_CARE_PROVIDER_SITE_OTHER): Payer: 59 | Admitting: Licensed Clinical Social Worker

## 2017-04-15 DIAGNOSIS — F1290 Cannabis use, unspecified, uncomplicated: Secondary | ICD-10-CM | POA: Diagnosis not present

## 2017-04-15 DIAGNOSIS — F330 Major depressive disorder, recurrent, mild: Secondary | ICD-10-CM

## 2017-04-16 ENCOUNTER — Encounter (HOSPITAL_COMMUNITY): Payer: Self-pay | Admitting: Licensed Clinical Social Worker

## 2017-04-16 NOTE — Progress Notes (Signed)
   THERAPIST PROGRESS NOTE  Session Time: 4-5pm  Participation Level: Active  Behavioral Response: CasualAlertEuthymic  Type of Therapy: Individual Therapy  Treatment Goals addressed: Coping  Interventions: CBT, Strength-based, Supportive and Reframing  Summary: Jordan FiddlerJoshua D Christian is a 21 y.o. male who presents with hx of anger outbursts when he is at home and directed towards his longtime GF. Pt presents as active, engaged, and more soft spoken than previous sessions. He reports his GF has been out of town for the past week and he has asked his brother to come over and been spending time w/ his parents to avoid loneliness. Pt denies drinking any alcohol but he does endorse some marijuana smoking "to help him sleep". Counselor encourages pt to talk about his anger, what reasons he has to be angry, and how his anger makes him feel. Pt and counselor continue to explore pt's anger using EFIT based interventions including reflection, and directing pt towards his bodily experience of emotions. Pt identifies a "pit in his stomach" when he thinks about being adopted and when he thinks about how his sister was removed from the foster home he grew up in due to her bad behavior. Pt and counselor agree that pt will return in 2 week intervals starting next session since he is feeling better. Pt expresses interest in a couples session in the near future.  Suicidal/Homicidal: Nowithout intent/plan  Therapist Response: Counselor encourages pt w/ supportive, open questions which allow pt to explore his inner feelings of anger. Pt is directed to spend 1 min in silence to explore how his anger "feels inside his body". Pt is compliant and becomes markedly more engaged in session after completion of this exercise.   Plan: Return again in 2 weeks.  Diagnosis:    ICD-10-CM   1. Mild episode of recurrent major depressive disorder (HCC) F33.0   2. Marijuana use, continuous F12.90        Margo CommonWesley E  Jullia Mulligan 04/16/2017

## 2017-04-27 ENCOUNTER — Encounter: Payer: Self-pay | Admitting: Gastroenterology

## 2017-04-27 ENCOUNTER — Ambulatory Visit (INDEPENDENT_AMBULATORY_CARE_PROVIDER_SITE_OTHER): Payer: 59 | Admitting: Gastroenterology

## 2017-04-27 VITALS — BP 100/52 | HR 66 | Ht 66.0 in | Wt 185.0 lb

## 2017-04-27 DIAGNOSIS — K219 Gastro-esophageal reflux disease without esophagitis: Secondary | ICD-10-CM | POA: Diagnosis not present

## 2017-04-27 DIAGNOSIS — R112 Nausea with vomiting, unspecified: Secondary | ICD-10-CM

## 2017-04-27 DIAGNOSIS — R1084 Generalized abdominal pain: Secondary | ICD-10-CM | POA: Diagnosis not present

## 2017-04-27 DIAGNOSIS — K5900 Constipation, unspecified: Secondary | ICD-10-CM

## 2017-04-27 MED ORDER — OMEPRAZOLE 40 MG PO CPDR: 40.0000 mg | DELAYED_RELEASE_CAPSULE | Freq: Every day | ORAL | 11 refills | Status: DC

## 2017-04-27 MED ORDER — GLYCOPYRROLATE 1 MG PO TABS: 1.0000 mg | ORAL_TABLET | Freq: Two times a day (BID) | ORAL | 5 refills | Status: DC

## 2017-04-27 NOTE — Patient Instructions (Addendum)
You have been scheduled for an abdominal ultrasound at [facility]iology (1st floor of hospital) on 05-01-17 at 9:30am. Please arrive 15 minutes prior to your appointment for registration. Make certain not to have anything to eat or drink 8 hours prior to your appointment. Should you need to reschedule your appointment, please contact radiology at [phone]. This test typically takes about 30 minutes to perform.  We have sent the following medications to your pharmacy for you to pick up at your convenience: Robinul  Omeprazole  Continue taking Benfiber and Stool softener daily for constipation.  We have given you handouts today regarding Anti Reflux meaures and a low gas diet.  We will call you for a follow up appointment in 1 month.  Thank you for choosing me and [facility].  Venita Lick. Pleas Koch., MD., Clementeen Graham

## 2017-04-27 NOTE — Progress Notes (Signed)
History of Present Illness: This is a 21 year old male referred by Norberto Sorenson, MD for the evaluation of N/V mainly in mornings, constipation, abdominal pain. H. pylori breath test in 03/2017 was negative. CMP, CBC in 03/2017 unremarkable. Pt uses marijuana regularly. Former cigarette smoker, now vapes. Heavy alcohol use until 2 months ago. He related frequent cramp-like abdominal pain that he describes as severe. It is not necessarily related to meals or bowel movements but generally happens several times a week when he is constipation. His bowel pattern alternates between normal and constipated. He does feel more problems with gas when he is constipated. He notes morning nausea and vomiting are much better when he takes an over-the-counter antacid at bedtime. Denies weight loss, diarrhea, change in stool caliber, melena, hematochezia, dysphagia, chest pain.  No Known Allergies Outpatient Medications Prior to Visit  Medication Sig Dispense Refill  . buPROPion (WELLBUTRIN XL) 300 MG 24 hr tablet Take 1 tablet (300 mg total) by mouth every morning. 90 tablet 1  . loratadine (CLARITIN) 10 MG tablet Take 10 mg by mouth daily.    . mirtazapine (REMERON) 45 MG tablet Take 1 tablet (45 mg total) by mouth at bedtime. 90 tablet 1  . Multiple Vitamin (MULTIVITAMIN WITH MINERALS) TABS tablet Take 1 tablet by mouth daily.    . naltrexone (DEPADE) 50 MG tablet Take 1 tablet (50 mg total) by mouth daily. 90 tablet 1  . omeprazole (PRILOSEC) 20 MG capsule Take 1 capsule (20 mg total) by mouth daily. 30 capsule 3   No facility-administered medications prior to visit.    Past Medical History:  Diagnosis Date  . Depression    History reviewed. No pertinent surgical history. Social History   Social History  . Marital status: Single    Spouse name: N/A  . Number of children: N/A  . Years of education: N/A   Occupational History  . Electrician    Social History Main Topics  . Smoking status: Current  Every Day Smoker    Years: 3.50    Types: Cigarettes, E-cigarettes  . Smokeless tobacco: Never Used  . Alcohol use 0.6 - 1.2 oz/week    1 - 2 Cans of beer per week     Comment: weekly  . Drug use: Yes    Types: Marijuana     Comment: smokes daily  . Sexual activity: Yes    Partners: Female    Birth control/ protection: None   Other Topics Concern  . None   Social History Narrative  . None   Family History  Problem Relation Age of Onset  . Adopted: Yes      Review of Systems: Pertinent positive and negative review of systems were noted in the above HPI section. All other review of systems were otherwise negative.   Physical Exam: General: Well developed, well nourished, no acute distress Head: Normocephalic and atraumatic Eyes:  sclerae anicteric, EOMI Ears: Normal auditory acuity Mouth: No deformity or lesions Neck: Supple, no masses or thyromegaly Lungs: Clear throughout to auscultation Heart: Regular rate and rhythm; no murmurs, rubs or bruits Abdomen: Soft, non tender and non distended. No masses, hepatosplenomegaly or hernias noted. Normal Bowel sounds Musculoskeletal: Symmetrical with no gross deformities  Skin: No lesions on visible extremities Pulses:  Normal pulses noted Extremities: No clubbing, cyanosis, edema or deformities noted Neurological: Alert oriented x 4, grossly nonfocal Cervical Nodes:  No significant cervical adenopathy Inguinal Nodes: No significant inguinal adenopathy Psychological:  Alert and  cooperative. Normal mood and affect  Assessment and Recommendations:  1. GERD. Follow standard antireflux measures. Begin omeprazole 40 mg every morning. If symptoms not improved consider EGD.  2. Nausea/vomiting. Possibly related to GERD or chronic marijuana use. Advised to discontinue marijuana. If symptoms not improve consider EGD. REV in about 6 weeks.  3. Abdominal pain, gas. Schedule abd [place]. 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 low gas diet. Gas-X 4 times a day  when necessary. Glycopyrrolate 1 mg by mouth twice a day.  4. Constipation. Stool softener and Benefiber daily. Adequate daily water intake.   cc: Norberto Sorenson, MD

## 2017-04-29 ENCOUNTER — Ambulatory Visit (HOSPITAL_COMMUNITY): Payer: Self-pay | Admitting: Licensed Clinical Social Worker

## 2017-05-01 ENCOUNTER — Ambulatory Visit (HOSPITAL_COMMUNITY): Admission: RE | Admit: 2017-05-01 | Payer: 59 | Source: Ambulatory Visit

## 2017-05-04 ENCOUNTER — Ambulatory Visit (HOSPITAL_COMMUNITY)
Admission: RE | Admit: 2017-05-04 | Discharge: 2017-05-04 | Disposition: A | Discharge status: Home / Self Care | Payer: 59 | Source: Ambulatory Visit | Attending: Gastroenterology | Admitting: Gastroenterology

## 2017-05-04 DIAGNOSIS — K5900 Constipation, unspecified: Secondary | ICD-10-CM | POA: Diagnosis not present

## 2017-05-04 DIAGNOSIS — R112 Nausea with vomiting, unspecified: Secondary | ICD-10-CM | POA: Diagnosis not present

## 2017-05-04 DIAGNOSIS — R1084 Generalized abdominal pain: Secondary | ICD-10-CM | POA: Diagnosis not present

## 2017-05-04 DIAGNOSIS — R161 Splenomegaly, not elsewhere classified: Secondary | ICD-10-CM | POA: Diagnosis not present

## 2017-05-28 ENCOUNTER — Ambulatory Visit (INDEPENDENT_AMBULATORY_CARE_PROVIDER_SITE_OTHER): Payer: 59 | Admitting: Physician Assistant

## 2017-05-28 ENCOUNTER — Encounter (INDEPENDENT_AMBULATORY_CARE_PROVIDER_SITE_OTHER): Payer: Self-pay

## 2017-05-28 ENCOUNTER — Encounter: Payer: Self-pay | Admitting: Physician Assistant

## 2017-05-28 DIAGNOSIS — F32A Depression, unspecified: Secondary | ICD-10-CM

## 2017-05-28 DIAGNOSIS — K219 Gastro-esophageal reflux disease without esophagitis: Secondary | ICD-10-CM

## 2017-05-28 DIAGNOSIS — Z87898 Personal history of other specified conditions: Secondary | ICD-10-CM

## 2017-05-28 DIAGNOSIS — R1084 Generalized abdominal pain: Secondary | ICD-10-CM | POA: Diagnosis not present

## 2017-05-28 DIAGNOSIS — F329 Major depressive disorder, single episode, unspecified: Secondary | ICD-10-CM

## 2017-05-28 DIAGNOSIS — F1011 Alcohol abuse, in remission: Secondary | ICD-10-CM | POA: Insufficient documentation

## 2017-05-28 MED ORDER — GLYCOPYRROLATE 1 MG PO TABS: 1.0000 mg | ORAL_TABLET | Freq: Two times a day (BID) | ORAL | 6 refills | Status: DC

## 2017-05-28 MED ORDER — OMEPRAZOLE 40 MG PO CPDR: DELAYED_RELEASE_CAPSULE | ORAL | 6 refills | Status: DC

## 2017-05-28 NOTE — Patient Instructions (Signed)
We have sent the following medications to your pharmacy for you to pick up at your convenience: [facility]. 1. Omeprazole 40 mg 2. Robinul Forte 2 mg  Take Benefiber daily. Nicolette Bang has the [employer] brand.  Take Gas X as needed.  We have provided you with a Reflux handout.   Follow up with Dr. Russella Dar or Mike Gip PA as needed.

## 2017-05-28 NOTE — Progress Notes (Signed)
Subjective:    Patient ID: Jordan Christian, male    DOB: [birth year], 21 y.o.   MRN: [id number]  HPI Jordan Christian is a 21 year old white male, recently established with Dr. Russella Dar when he was seen on 04/27/2017 with complaints of early morning nausea vomiting and intermittent abdominal pain. Labs in July 2018 were unremarkable with CBC and CMET. H. pylori breath testing negative prior to being referred here. He had been using alcohol heavily until June 2018. He is being actively followed by behavioral health for substance abuse and mood disorder. He is currently on Wellbutrin and Remeron. He was started on omeprazole 40 mg by mouth every morning, Gas-X as needed, low gas diet and glycopyrrolate 1 mg twice daily. He comes in today for follow-up. He states that he is feeling better. He is no longer having any nighttime reflux symptoms and is generally not waking up in the morning with nausea and vomiting. He says that he's never been able to eat during the day for many years and generally just eats in the evenings between 7 and 9 PM. He has maintained his weight. No current complaints of dysphagia Jordan Christian. His bowels are moving fairly well. He says he still having intermittent spasms" in his abdomen which she describes as a crampy knot-like feeling but says overall happening less frequently and not as intense as it was previously. He says is doing much better regarding his alcohol use, and is "not drinking much at all". I did not ask about marijuana usage today. He says initially he felt better with Wellbutrin and Remeron but wants to talk to his psych M.D. regarding changing his meds as he feels like he has been backsliding some recently. He does have an appointment with his counselor next week.  Review of Systems Pertinent positive and negative review of systems were noted in the above HPI section.  All other review of systems was otherwise negative.  Outpatient Encounter Prescriptions as of 05/28/2017    Medication Sig  . buPROPion (WELLBUTRIN XL) 300 MG 24 hr tablet Take 1 tablet (300 mg total) by mouth every morning.  Marland Kitchen glycopyrrolate (ROBINUL) 1 MG tablet Take 1 tablet (1 mg total) by mouth 2 (two) times daily.  Marland Kitchen loratadine (CLARITIN) 10 MG tablet Take 10 mg by mouth daily.  . mirtazapine (REMERON) 45 MG tablet Take 1 tablet (45 mg total) by mouth at bedtime.  . Multiple Vitamin (MULTIVITAMIN WITH MINERALS) TABS tablet Take 1 tablet by mouth daily.  . naltrexone (DEPADE) 50 MG tablet Take 1 tablet (50 mg total) by mouth daily.  Marland Kitchen omeprazole (PRILOSEC) 40 MG capsule Take 1 capsule by mouth every morning.  . [DISCONTINUED] glycopyrrolate (ROBINUL) 1 MG tablet Take 1 tablet (1 mg total) by mouth 2 (two) times daily.  . [DISCONTINUED] omeprazole (PRILOSEC) 40 MG capsule Take 1 capsule (40 mg total) by mouth daily.   No facility-administered encounter medications on file as of 05/28/2017.    No Known Allergies There are no active problems to display for this patient.  Social History   Social History  . Marital status: Single    Spouse name: N/A  . Number of children: N/A  . Years of education: N/A   Occupational History  . Electrician    Social History Main Topics  . Smoking status: Former Smoker    Years: 3.50    Types: Cigarettes, E-cigarettes  . Smokeless tobacco: Never Used  . Alcohol use No     Comment: occ  .  Drug use: Yes    Types: Marijuana     Comment: smokes daily  . Sexual activity: Yes    Partners: Female    Birth control/ protection: None   Other Topics Concern  . Not on file   Social History Narrative  . No narrative on file    Mr. Jordan Christian family history is not on file. He was adopted.      Objective:    Vitals:   05/28/17 1514  BP: 126/76  Pulse: 75    Physical Exam  ;21-year-old young white male in no acute distress, a pressure 126/76 pulse 75, height 5 foot 4, weight 183, BMI 31.4. HEENT ;nontraumatic normocephalic EOMI PERRLA sclera  anicteric, Cardiovascular ;regular rate and rhythm with S1-S2 no murmur or gallop, Pulmonary ;clear bilaterally, Abdomen; soft, nontender nondistended bowel sounds are active there is no palpable mass or hepatosplenomegaly, Rectal; exam not done, Ext; no clubbing cyanosis or edema skin warm and dry, Neuropsych; mood and affect appropriate       Assessment & Plan:   #67 21 year old white male with GERD which had primarily been manifested with nocturnal symptoms-improved on omeprazole 40 mg daily #2 intermittent abdominal cramping-most consistent with functional bowel disease/IBS-improved on glycopyrrolate #3 substance abuse-EtOH and marijuana #4 depression  Plan; Patient is stable from a GI standpoint and improved symptomatically. I don't feel EGD is indicated at this time as not likely to change our management He is to continue an antireflux regimen, also lower gas foods. Use Gas-X as needed Continue omeprazole 40 mg by mouth every morning Continue glycopyrrolate 1 mg by mouth twice a day refills sent for both meds He will follow-up with Dr. Russella Dar or myself on an as-needed basis. He knows to call should he have any worsening or exacerbation of his symptoms.  Jordan Christian S Jordan Perazzo PA-C 05/28/2017   Cc: Shade Flood, MD

## 2017-05-29 NOTE — Progress Notes (Signed)
Reviewed and agree with initial management plan.  Kaylene Dawn T. Benson Porcaro, MD FACG 

## 2017-06-08 ENCOUNTER — Encounter (HOSPITAL_COMMUNITY): Payer: Self-pay | Admitting: Licensed Clinical Social Worker

## 2017-06-08 ENCOUNTER — Ambulatory Visit (INDEPENDENT_AMBULATORY_CARE_PROVIDER_SITE_OTHER): Payer: 59 | Admitting: Licensed Clinical Social Worker

## 2017-06-08 DIAGNOSIS — F330 Major depressive disorder, recurrent, mild: Secondary | ICD-10-CM

## 2017-06-08 DIAGNOSIS — F39 Unspecified mood [affective] disorder: Secondary | ICD-10-CM | POA: Diagnosis not present

## 2017-06-08 DIAGNOSIS — F1290 Cannabis use, unspecified, uncomplicated: Secondary | ICD-10-CM | POA: Diagnosis not present

## 2017-06-09 ENCOUNTER — Encounter (HOSPITAL_COMMUNITY): Payer: Self-pay | Admitting: Licensed Clinical Social Worker

## 2017-06-09 NOTE — Progress Notes (Signed)
   THERAPIST PROGRESS NOTE  Session Time: 9-10  Participation Level: Active  Behavioral Response: Neat and Well GroomedAlertEuthymic  Type of Therapy: Individual Therapy  Treatment Goals addressed: Diagnosis: MDD, Marijuana Abuse  Interventions: CBT, Strength-based, Supportive and Reframing  Summary: Jordan Christian is a 21 y.o. male who presents with disturbance of mood and anger outbursts at s/o. He reports he has been feeling more down lately and has been buying alcohol again for the first time in a few weeks. He states he is continueing to take Naltrexone which makes him feel sick when he does drink. Pt reports he is having "frequent ups and downs" and irritability. Pt expressed interest in possibly changing his medications. Pt was encouraged to cont meds as px for now, until his next meeting w/ Dr. Rene Kocher. Pt was reminded that he has seen an increase in depression and anxiety since he has not come to therapy in 4 weeks.   Counselor explored a significant conflict the pt reported on w/ his future grandfather-in-law, which occurred last week at a family gathering. Pt was mocked, verbally harassed, and told he was "dumb" bc he did not understand the rules to a lawn game. Counselor discussed pt's reaction to event including thought process, triggered feelings, and behavioral response. Pt admitted he felt "upset" and left the gathering.  Pt discussed his "quick to anger" attitude when he is shown "disrepsect". Counselor provided handout on anger management and discussed mindfulness techniques for becoming aware of irritability early before pt is compelled to turn to physical violence.   Suicidal/Homicidal: Nowithout intent/plan  Therapist Response: Counselor used CBT and an ABC model to help pt gain insight into triggering response to emotion. Pt appeared to gain insight into his feeling inferior when he is "disrepected".   Plan: Return again in 1 weeks.  Diagnosis:    ICD-10-CM   1. Mild  episode of recurrent major depressive disorder (HCC) F33.0   2. Marijuana use, continuous F12.90   3. Mood disorder [facility]) F39        Margo Common, LCAS-A 06/09/2017

## 2017-06-19 ENCOUNTER — Ambulatory Visit (HOSPITAL_COMMUNITY): Payer: Self-pay | Admitting: Licensed Clinical Social Worker

## 2017-06-26 ENCOUNTER — Ambulatory Visit (INDEPENDENT_AMBULATORY_CARE_PROVIDER_SITE_OTHER): Payer: 59 | Admitting: Licensed Clinical Social Worker

## 2017-06-26 ENCOUNTER — Encounter (HOSPITAL_COMMUNITY): Payer: Self-pay | Admitting: Licensed Clinical Social Worker

## 2017-06-26 DIAGNOSIS — F39 Unspecified mood [affective] disorder: Secondary | ICD-10-CM | POA: Diagnosis not present

## 2017-06-26 DIAGNOSIS — F330 Major depressive disorder, recurrent, mild: Secondary | ICD-10-CM

## 2017-06-26 DIAGNOSIS — F1290 Cannabis use, unspecified, uncomplicated: Secondary | ICD-10-CM | POA: Diagnosis not present

## 2017-06-26 NOTE — Progress Notes (Signed)
   THERAPIST PROGRESS NOTE  Session Time: 8-9am  Participation Level: Active  Behavioral Response: Casual and Fairly GroomedAlertEuthymic  Type of Therapy: Individual Therapy  Treatment Goals addressed: Anxiety  Interventions: CBT, Strength-based and Supportive  Summary: Jordan Christian is a 21 y.o. male who presents with mild anxiety and spontaneous tearfulness. He is active and engaged in session though somewhat softspoken and mild mannered.  He states his depression has been overall "getting better" though he did recently attend the funeral of his grandfather which is causing him some normal and healthy grief. Pt described feeling "overwhelmed when he feels out of control". 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 Counselor and pt discussed options for pursuing more schooling in his future to become an Art gallery managerengineer.  Pt was happy to report he received a raise of $4/hr and is "starting to look up" for himself and his relationship. Pt denies any issues w/ excessive drinking and reports he is no longer taking naltrexone, unless he anticipates "wanting to drink more than he should". Pt states he may need to change his medicine since he still feels "a little depressed".  Suicidal/Homicidal: Nowithout intent/plan  Therapist Response: COunlseor used open questions and reflection to help pt explore his insights into his own depression and anxiety. Counselor explored pt's spiritiuality and belief system in order to gain more insight into pt's experience of grief. Pt admits he is worried about losing his father unexpected due to his morbid obesity. Counselor and pt discussed pt's sense of responsibility and desire to learn more from his father before his father dies.   Plan: Return again in 2  weeks.  Diagnosis:    ICD-10-CM   1. Mild episode of recurrent major depressive disorder (HCC) F33.0   2. Marijuana use, continuous F12.90   3. Mood disorder [facility](HCC) F39        Jordan Christian, LCAS-A 06/26/2017

## 2017-07-06 ENCOUNTER — Ambulatory Visit (INDEPENDENT_AMBULATORY_CARE_PROVIDER_SITE_OTHER): Payer: 59 | Admitting: Psychiatry

## 2017-07-06 VITALS — BP 140/98 | HR 74 | Ht 65.0 in | Wt 185.0 lb

## 2017-07-06 DIAGNOSIS — R202 Paresthesia of skin: Secondary | ICD-10-CM

## 2017-07-06 DIAGNOSIS — F330 Major depressive disorder, recurrent, mild: Secondary | ICD-10-CM

## 2017-07-06 DIAGNOSIS — J3089 Other allergic rhinitis: Secondary | ICD-10-CM

## 2017-07-06 DIAGNOSIS — Z87891 Personal history of nicotine dependence: Secondary | ICD-10-CM

## 2017-07-06 DIAGNOSIS — F39 Unspecified mood [affective] disorder: Secondary | ICD-10-CM

## 2017-07-06 MED ORDER — CITALOPRAM HYDROBROMIDE 20 MG PO TABS: 20.0000 mg | ORAL_TABLET | Freq: Every day | ORAL | 2 refills | Status: DC

## 2017-07-06 MED ORDER — BUPROPION HCL ER (XL) 150 MG PO TB24: 150.0000 mg | ORAL_TABLET | ORAL | 0 refills | Status: DC

## 2017-07-06 MED ORDER — MIRTAZAPINE 45 MG PO TABS: 45.0000 mg | ORAL_TABLET | Freq: Every day | ORAL | 1 refills | Status: DC

## 2017-07-06 NOTE — Progress Notes (Signed)
BH MD/PA/NP OP Progress Note  07/06/2017 4:19 PM Jordan FiddlerJoshua D Surita  MRN:  [id number]  Chief Complaint: Some irritability HPI: Jordan Christian has multiple medical complaints today, namely difficulty with sleeping at night due to snoring, he is using an Anefrin consistently, and I cautioned him against this, recommended ENT referral, he was agreeable to this.  He reports that he is also been having bilateral tingling in his hands and arms from his elbow down.  This tends to start when he is doing things like driving, or at work.  He works with his hands quite a lot, discussed possible carpel tunnel, b12, DM, agreed to get labs.  Regarding mood symptoms, he reports that he is been a bit more irritable and he tends to feel wired.  He agrees that perhaps the dose of Wellbutrin is too much for him, and the 150 mg dose did not really do anything.  We agreed to taper and discontinue Wellbutrin, and initiate Celexa.  Reviewed the risks and benefits of SSRI, and expected response in the coming 6-10 weeks.  He continues to use naltrexone periodically.  He takes Remeron every night for sleep with good effect.  He reports that he drinks about 3-4 alcoholic beverages per week.  He continues to work with [facility] and individual therapy and reports that this is a significant benefit for his anxiety and mood.  I spent time with the patient sharing my grief for his recent losses, as his father and grandfather had passed away.  He reports that he is doing okay and coping with this, he was not particularly close with either of them, but recognizes that this is still a grief and loss.  Visit Diagnosis:    ICD-10-CM   1. Seasonal allergic rhinitis due to other allergic trigger J30.89 Ambulatory referral to ENT  2. Tingling in extremities R20.2 Ambulatory referral to Neurology  3. Mild episode of recurrent major depressive disorder (HCC) F33.0 buPROPion (WELLBUTRIN XL) 150 MG 24 hr tablet    citalopram (CELEXA) 20 MG  tablet  4. Mood disorder (HCC) F39 mirtazapine (REMERON) 45 MG tablet    Past Psychiatric History: See intake H&P for full details. Reviewed, with no updates at this time.   Past Medical History:  Past Medical History:  Diagnosis Date  . Depression   . GERD (gastroesophageal reflux disease)     Past Surgical History:  Procedure Laterality Date  . WISDOM TOOTH EXTRACTION      Family Psychiatric History: See intake H&P for full details. Reviewed, with no updates at this time.   Family History:  Family History  Problem Relation Age of Onset  . Adopted: Yes    Social History:  Social History   Social History  . Marital status: Single    Spouse name: N/A  . Number of children: N/A  . Years of education: N/A   Occupational History  . Electrician    Social History Main Topics  . Smoking status: Former Smoker    Years: 3.50    Types: Cigarettes, E-cigarettes  . Smokeless tobacco: Never Used  . Alcohol use No     Comment: occ  . Drug use: Yes    Types: Marijuana     Comment: smokes daily  . Sexual activity: Yes    Partners: Female    Birth control/ protection: None   Other Topics Concern  . Not on file   Social History Narrative  . No narrative on file    Allergies: No  Known Allergies  Metabolic Disorder Labs: No results found for: HGBA1C, MPG No results found for: PROLACTIN No results found for: CHOL, TRIG, HDL, CHOLHDL, VLDL, LDLCALC Lab Results  Component Value Date   TSH 2.093 06/08/2010    Therapeutic Level Labs: No results found for: LITHIUM No results found for: VALPROATE No components found for:  CBMZ  Current Medications: Current Outpatient Prescriptions  Medication Sig Dispense Refill  . buPROPion (WELLBUTRIN XL) 150 MG 24 hr tablet Take 1 tablet (150 mg total) by mouth every morning. Take 150 mg daily for 1 month, then stop 30 tablet 0  . citalopram (CELEXA) 20 MG tablet Take 1 tablet (20 mg total) by mouth daily. Take 1/2 tablet for 1  week, the increase to whole tablet 30 tablet 2  . glycopyrrolate (ROBINUL) 1 MG tablet Take 1 tablet (1 mg total) by mouth 2 (two) times daily. 60 tablet 6  . loratadine (CLARITIN) 10 MG tablet Take 10 mg by mouth daily.    . mirtazapine (REMERON) 45 MG tablet Take 1 tablet (45 mg total) by mouth at bedtime. 90 tablet 1  . Multiple Vitamin (MULTIVITAMIN WITH MINERALS) TABS tablet Take 1 tablet by mouth daily.    . naltrexone (DEPADE) 50 MG tablet     . omeprazole (PRILOSEC) 40 MG capsule Take 1 capsule by mouth every morning. 30 capsule 6   No current facility-administered medications for this visit.      Musculoskeletal: Strength & Muscle Tone: within normal limits Gait & Station: normal Patient leans: N/A  Psychiatric Specialty Exam: ROS  Blood pressure (!) 140/98, pulse 74, height 5\' 5"  (1.651 m), weight 185 lb (83.9 kg).Body mass index is 30.79 kg/m.  General Appearance: Casual and Fairly Groomed  Eye Contact:  Fair  Speech:  Clear and Coherent  Volume:  Normal  Mood:  Anxious  Affect:  Appropriate and Congruent  Thought Process:  Goal Directed and Descriptions of Associations: Intact  Orientation:  Full (Time, Place, and Person)  Thought Content: Logical   Suicidal Thoughts:  No  Homicidal Thoughts:  No  Memory:  Recent;   Good  Judgement:  Fair  Insight:  Fair  Psychomotor Activity:  Normal  Concentration:  Concentration: Good  Recall:  Good  Fund of Knowledge: Good  Language: Good  Akathisia:  Negative  Handed:  Right  AIMS (if indicated): not done  Assets:  Communication Skills Desire for Improvement Financial Resources/Insurance Housing Intimacy Social Support Transportation Vocational/Educational  ADL's:  Intact  Cognition: WNL  Sleep:  Good   Screenings: PHQ2-9     Office Visit from 03/12/2017 in [facility] at [facility]  PHQ-2 Total Score  0       Assessment and Plan: Jordan Christian is a 21 year old male with mild alcohol use disorder, currently  continuing to drink approximately 3-4 beers per week, not at a level where it is interfering with his life and relationships.  He struggles with depression, unspecified mood disorder and difficulties with attention, had multiple diagnoses as a child.  He presents today with some increase in irritability , complicated by the increased dose of Wellbutrin, and the recent death of his father and grandfather.  I believe he would benefit from low-dose mild SSRI, and continued engagement in therapy.  No acute safety issues at this time.  I would like to obtain laboratory studies as below and we will make referral for him to ENT and neurology given his physical complaints as discussed above.  1. Seasonal  allergic rhinitis due to other allergic trigger   2. Tingling in extremities   3. Mild episode of recurrent major depressive disorder (HCC)   4. Mood disorder (HCC)     Status of current problems: slight increase in irritability, grief  Labs Ordered: Orders Placed This Encounter  Procedures  . Ambulatory referral to ENT    Referral Priority:   Routine    Referral Type:   Consultation    Referral Reason:   Specialty Services Required    Requested Specialty:   Otolaryngology    Number of Visits Requested:   1  . Ambulatory referral to Neurology    Referral Priority:   Routine    Referral Type:   Consultation    Referral Reason:   Specialty Services Required    Requested Specialty:   Neurology    Number of Visits Requested:   1    Labs Reviewed: Obtaining labs today  Collateral Obtained/Records Reviewed: N/A  Plan:  Taper and discontinue Wellbutrin Initiate Celexa 10 mg, increase to 20 mg in 1 week Continue Remeron 45 mg nightly Continue naltrexone 50 mg daily for alcohol cessation and craving reduction  I spent 30 minutes with the patient in direct face-to-face clinical care.  Greater than 50% of this time was spent in counseling and coordination of care with the  patient.    Burnard Leigh, MD 07/06/2017, 4:19 PM

## 2017-07-07 ENCOUNTER — Encounter: Payer: Self-pay | Admitting: Neurology

## 2017-07-07 LAB — CBC WITH DIFFERENTIAL/PLATELET
BASOS ABS: 0 10*3/uL (ref 0.0–0.2)
BASOS: 0 %
EOS (ABSOLUTE): 0.2 10*3/uL (ref 0.0–0.4)
Eos: 3 %
HEMOGLOBIN: 13.8 g/dL (ref 13.0–17.7)
Hematocrit: 40.3 % (ref 37.5–51.0)
Immature Grans (Abs): 0 10*3/uL (ref 0.0–0.1)
Immature Granulocytes: 0 %
LYMPHS ABS: 1.8 10*3/uL (ref 0.7–3.1)
Lymphs: 34 %
MCH: 30.3 pg (ref 26.6–33.0)
MCHC: 34.2 g/dL (ref 31.5–35.7)
MCV: 88 fL (ref 79–97)
Monocytes Absolute: 0.4 10*3/uL (ref 0.1–0.9)
Monocytes: 8 %
NEUTROS ABS: 2.8 10*3/uL (ref 1.4–7.0)
Neutrophils: 55 %
Platelets: 224 10*3/uL (ref 150–379)
RBC: 4.56 x10E6/uL (ref 4.14–5.80)
RDW: 13.5 % (ref 12.3–15.4)
WBC: 5.2 10*3/uL (ref 3.4–10.8)

## 2017-07-07 LAB — HEMOGLOBIN A1C
Est. average glucose Bld gHb Est-mCnc: 111 mg/dL
HEMOGLOBIN A1C: 5.5 % (ref 4.8–5.6)

## 2017-07-07 LAB — VITAMIN B12: VITAMIN B 12: 606 pg/mL (ref 232–1245)

## 2017-07-21 ENCOUNTER — Encounter (HOSPITAL_COMMUNITY): Payer: Self-pay | Admitting: Licensed Clinical Social Worker

## 2017-07-21 ENCOUNTER — Ambulatory Visit (INDEPENDENT_AMBULATORY_CARE_PROVIDER_SITE_OTHER): Payer: 59 | Admitting: Licensed Clinical Social Worker

## 2017-07-21 DIAGNOSIS — F39 Unspecified mood [affective] disorder: Secondary | ICD-10-CM | POA: Diagnosis not present

## 2017-07-21 DIAGNOSIS — F330 Major depressive disorder, recurrent, mild: Secondary | ICD-10-CM

## 2017-07-21 DIAGNOSIS — F1290 Cannabis use, unspecified, uncomplicated: Secondary | ICD-10-CM

## 2017-07-21 DIAGNOSIS — F1010 Alcohol abuse, uncomplicated: Secondary | ICD-10-CM | POA: Diagnosis not present

## 2017-07-21 NOTE — Progress Notes (Signed)
   THERAPIST PROGRESS NOTE  Session Time: 10-11  Participation Level: Active  Behavioral Response: CasualAlertEuthymic  Type of Therapy: Individual Therapy  Treatment Goals addressed: Anxiety and Diagnosis: MDD, SUD  Interventions: CBT  Summary: Jordan Christian is a 21 y.o. male who presents with Irritability, worry, and frustration with self-admitted alcohol use disorder mild. He states that his weeks have been similar to previous weeks and that his irritability is not improving. Pt states he is in the works to get into tx with an ENT doctor, as per Dr. Rene KocherEksir, concerning his sleep disturbances of waking up periodically bc "he can't breathe".   Pt reports he continues to experience a pattern of "loosing stuff in his house" and becoming frustrated when he cannot find his proper clothes, medications, headphones, etc.... Pt and counselor explore pt's negative cognitions and feelings at work when others are not working "at his standard of excellence". Counselor and pt go over a CBT-based chart showing pt's pattern of Event-Belief-Feeling-Behavior. Pt spends majority of intervention defending his position that "other people should do better".  Pt states in the final minute of therapy that he has been drinking around 2-3 drinks nightly for the past week "bc it helps to relax him". Pt admits he does not want to stop but he wants to monitor this "so it does not become a worse problem". Pt states "mg S/O hasn't mentioned it yet", indicating an external motivation to change this problematic bx, which, in his father, became a severe alcohol use disorder.    Suicidal/Homicidal: Nowithout intent/plan  Therapist Response: Pt appears to be concerned with his secondary symptoms of irritability and frustration and shows a high degree of external locus of control, with many "shoulds" for the people around him. 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 Pt appears to be in denial as to the extent to which his own inability affects his view of self, and self confidence.   Counselor used CBT based chart to introduce idea of "styles of thinking that keep pt stuck in negativity". Pt did not respond to intervention as predicted and instead chose to further confirm his "need for others to change their bx".   Plan: Return again in 2 weeks.  Diagnosis:    ICD-10-CM   1. Mild episode of recurrent major depressive disorder (HCC) F33.0   2. Alcohol use disorder, mild, abuse F10.10   3. Mood disorder (HCC) F39   4. Marijuana use, continuous F12.90       Margo CommonWesley E Betzayda Braxton, LCAS-A 07/21/2017

## 2017-07-23 ENCOUNTER — Ambulatory Visit (INDEPENDENT_AMBULATORY_CARE_PROVIDER_SITE_OTHER): Payer: 59 | Admitting: Otolaryngology

## 2017-07-23 DIAGNOSIS — J310 Chronic rhinitis: Secondary | ICD-10-CM

## 2017-07-23 DIAGNOSIS — J343 Hypertrophy of nasal turbinates: Secondary | ICD-10-CM

## 2017-07-23 DIAGNOSIS — H9041 Sensorineural hearing loss, unilateral, right ear, with unrestricted hearing on the contralateral side: Secondary | ICD-10-CM | POA: Diagnosis not present

## 2017-08-17 ENCOUNTER — Ambulatory Visit (HOSPITAL_COMMUNITY): Payer: Self-pay | Admitting: Licensed Clinical Social Worker

## 2017-08-20 ENCOUNTER — Ambulatory Visit (INDEPENDENT_AMBULATORY_CARE_PROVIDER_SITE_OTHER): Payer: 59 | Admitting: Otolaryngology

## 2017-08-20 DIAGNOSIS — J310 Chronic rhinitis: Secondary | ICD-10-CM | POA: Diagnosis not present

## 2017-08-20 DIAGNOSIS — J342 Deviated nasal septum: Secondary | ICD-10-CM | POA: Diagnosis not present

## 2017-08-20 DIAGNOSIS — J343 Hypertrophy of nasal turbinates: Secondary | ICD-10-CM

## 2017-09-03 ENCOUNTER — Encounter (HOSPITAL_COMMUNITY): Payer: Self-pay | Admitting: Licensed Clinical Social Worker

## 2017-09-03 ENCOUNTER — Telehealth (HOSPITAL_COMMUNITY): Payer: Self-pay | Admitting: Licensed Clinical Social Worker

## 2017-09-03 ENCOUNTER — Ambulatory Visit (INDEPENDENT_AMBULATORY_CARE_PROVIDER_SITE_OTHER): Payer: 59 | Admitting: Licensed Clinical Social Worker

## 2017-09-03 DIAGNOSIS — F330 Major depressive disorder, recurrent, mild: Secondary | ICD-10-CM

## 2017-09-03 NOTE — Progress Notes (Signed)
   THERAPIST PROGRESS NOTE  Session Time: 9-10  Participation Level: Active  Behavioral Response: Casual and Fairly GroomedAlertEuthymic  Type of Therapy: Individual Therapy  Treatment Goals addressed: Coping  Interventions: CBT, Strength-based and Supportive  Summary: Jordan FiddlerJoshua D Christian is a 21 y.o. male who presents with recurrent moderate depressive symptoms. He reports he continues to feel "more positive" about himself and his outlook on life. He is feeling stable in his employment and making more money than he ever has. He is goal-oriented in session. 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 Pt is asked about his most enjoyable days and he responds "the ones where I accomplish the most".   Pt admits his marijuana smoking is near daily and he hopes to cut back on his consumption of THC in 2019. He has support from his gf for this. Pt states he plans to "buy less and only smoke on weekends". Pt states he has recently started keeping a work journal that helps him stay on task, remember his progress, and keep his work informed of the progress he makes on the job site.  Pt states he hopes to develop new hobbies w/ his reduction in smoking marijuana, such as leather or wood working.    Suicidal/Homicidal: Nowithout intent/plan  Therapist Response: Counselor used socratic questioning to help pt identify his goals for 2019. Counselor assessed pt level of functioning and progress towards his goals. Pt is drinking less and now wants to work on smoking less weed. Pt appears interested in tx and active in his application of learned skills, changes in bx, and engagement of tx.  Plan: Return again in 2 weeks.  Diagnosis:    ICD-10-CM   1. Mild episode of recurrent major depressive disorder [facility](HCC) F33.0        Margo CommonWesley E Nellie Pester, LCAS-A 09/03/2017

## 2017-09-04 ENCOUNTER — Other Ambulatory Visit: Payer: Self-pay | Admitting: Otolaryngology

## 2017-09-07 DIAGNOSIS — J342 Deviated nasal septum: Secondary | ICD-10-CM | POA: Diagnosis not present

## 2017-09-14 ENCOUNTER — Telehealth (HOSPITAL_COMMUNITY): Payer: Self-pay | Admitting: Licensed Clinical Social Worker

## 2017-09-14 NOTE — Telephone Encounter (Signed)
Called pt to discuss recent episode of panic and "feeling like I was dying for no reason". Counselor talked pt through what was likely a panic attack. Pt stated it had never been that serious before. Pt was advised to continue medications as px and call if another episode happens before his appointment w/ this writer on Friday 09/18/17.

## 2017-09-14 NOTE — Telephone Encounter (Signed)
PT called to inquire about a recent anxiety attack he had. Requested call back before Thursday appointment.

## 2017-09-18 ENCOUNTER — Ambulatory Visit (INDEPENDENT_AMBULATORY_CARE_PROVIDER_SITE_OTHER): Payer: 59 | Admitting: Licensed Clinical Social Worker

## 2017-09-18 ENCOUNTER — Encounter (HOSPITAL_COMMUNITY): Payer: Self-pay | Admitting: Licensed Clinical Social Worker

## 2017-09-18 DIAGNOSIS — F1290 Cannabis use, unspecified, uncomplicated: Secondary | ICD-10-CM

## 2017-09-18 DIAGNOSIS — F330 Major depressive disorder, recurrent, mild: Secondary | ICD-10-CM | POA: Diagnosis not present

## 2017-09-18 NOTE — Progress Notes (Signed)
   THERAPIST PROGRESS NOTE  Session Time: 9-10  Participation Level: Active  Behavioral Response: Neat and Well GroomedAlertEuthymic  Type of Therapy: Individual Therapy  Treatment Goals addressed: Anxiety and Diagnosis: MDD  Interventions: CBT, Strength-based and Supportive  Summary: Jordan FiddlerJoshua D Speece is a 22 y.o. male who presents with symptoms of panic attack, depression, and stress from unmet expectations of self and world. He reports he had an "episode of feeling like I was going to die" earlier this week. When asked for details, pt revealed he felt sx of disassociation and feeling "not all there". Counselor validated pt's experience of what sounds like panic. Pt admitted he had an episode of this feeling a few years ago but it was not this bad.    Counselor and pt discuss pt's ongoing and excessive use of marijuana near daily. Though pt admits he has reduced his drinking to 3-4 times week/2 drinks, he admits he smokes marijuana before and after work. He is trying to smoke less by buying less often and in smaller quantities. Counselor spends time discussing the researched effects of marijuana on anxiety and sleep disturbances. Pt agrees he wants to reconsider his relationship to marijauna and admits he would be "very hard to quit for 2 weeks" since I would get very irritable.   Suicidal/Homicidal: Nowithout intent/plan  Therapist Response: Counselor used MI to help resolve ambivalence about pt's marijuana abuse. Counselor used person centered listening, evoking pt's change talk, and reflection of desire to get healthy.   Plan: Return again in 2 weeks.  Diagnosis:    ICD-10-CM   1. Mild episode of recurrent major depressive disorder (HCC) F33.0   2. Marijuana use, continuous F12.90        Margo CommonWesley E Asaiah Hunnicutt, LCAS-A 09/18/2017

## 2017-09-28 ENCOUNTER — Encounter (HOSPITAL_BASED_OUTPATIENT_CLINIC_OR_DEPARTMENT_OTHER): Payer: Self-pay

## 2017-09-28 ENCOUNTER — Ambulatory Visit (HOSPITAL_BASED_OUTPATIENT_CLINIC_OR_DEPARTMENT_OTHER): Admit: 2017-09-28 | Payer: 59 | Admitting: Otolaryngology

## 2017-09-28 SURGERY — SEPTOPLASTY, NOSE, WITH NASAL TURBINATE REDUCTION
Anesthesia: General | Laterality: Bilateral

## 2017-09-29 ENCOUNTER — Encounter (HOSPITAL_COMMUNITY): Payer: Self-pay | Admitting: Psychiatry

## 2017-09-29 ENCOUNTER — Ambulatory Visit (HOSPITAL_COMMUNITY): Payer: 59 | Admitting: Psychiatry

## 2017-09-29 DIAGNOSIS — Z79899 Other long term (current) drug therapy: Secondary | ICD-10-CM

## 2017-09-29 DIAGNOSIS — F1290 Cannabis use, unspecified, uncomplicated: Secondary | ICD-10-CM

## 2017-09-29 DIAGNOSIS — F39 Unspecified mood [affective] disorder: Secondary | ICD-10-CM | POA: Diagnosis not present

## 2017-09-29 DIAGNOSIS — F1010 Alcohol abuse, uncomplicated: Secondary | ICD-10-CM | POA: Diagnosis not present

## 2017-09-29 DIAGNOSIS — F330 Major depressive disorder, recurrent, mild: Secondary | ICD-10-CM | POA: Diagnosis not present

## 2017-09-29 DIAGNOSIS — Z87891 Personal history of nicotine dependence: Secondary | ICD-10-CM | POA: Diagnosis not present

## 2017-09-29 MED ORDER — CITALOPRAM HYDROBROMIDE 40 MG PO TABS: 40.0000 mg | ORAL_TABLET | Freq: Every day | ORAL | 1 refills | Status: DC

## 2017-09-29 MED ORDER — MIRTAZAPINE 45 MG PO TABS: 45.0000 mg | ORAL_TABLET | Freq: Every day | ORAL | 1 refills | Status: DC

## 2017-09-29 NOTE — Progress Notes (Signed)
BH MD/PA/NP OP Progress Note  09/29/2017 3:36 PM Jordan Christian  MRN:  [id number]  Chief Complaint: med management  HPI: Jordan Christian reports that his anxiety and mood been less irritable and calmer with Celexa 20 mg, and his girlfriend gave him that feedback.  He still continues to have some periods of irritability and anxiety, particularly at the end of the day.  He has decreased his marijuana use, but continues to drink 1-2 beers per night.  He reports that the beers help him to wind down and get rid of some of the anxiety and worry about the day.  Spent time discussing that this can be quite maladaptive and he was agreeable.  We agreed to increase Celexa to 40 mg for further targeting of anxiety and depressive symptoms.  He continues on Remeron at night.  We discontinued naltrexone and Wellbutrin given no longer using and lack of benefit.  I suggested we reconsider a trial of stimulant after we see how the Celexa does for him over the next couple months.  He was agreeable to this.  We discussed this recent episode of panic, in the context of him being at work, no particular trigger, he will be seeing neurology next week for his tingling in his hands, and discussed his concerns with them regarding possible seizure episode.  My suspicion of seizures quite low and I suspect it was breakthrough anxiety resulting in a panic episode.  Visit Diagnosis:    ICD-10-CM   1. Marijuana use, continuous F12.90   2. Mild episode of recurrent major depressive disorder (HCC) F33.0 citalopram (CELEXA) 40 MG tablet  3. Mood disorder (HCC) F39 mirtazapine (REMERON) 45 MG tablet  4. Alcohol use disorder, mild, abuse F10.10     Past Psychiatric History: See intake H&P for full details. Reviewed, with no updates at this time.   Past Medical History:  Past Medical History:  Diagnosis Date  . Depression   . GERD (gastroesophageal reflux disease)     Past Surgical History:  Procedure Laterality Date  . WISDOM  TOOTH EXTRACTION      Family Psychiatric History: See intake H&P for full details. Reviewed, with no updates at this time.   Family History:  Family History  Adopted: Yes    Social History:  Social History   Socioeconomic History  . Marital status: Single    Spouse name: Not on file  . Number of children: Not on file  . Years of education: Not on file  . Highest education level: Not on file  Social Needs  . Financial resource strain: Not on file  . Food insecurity - worry: Not on file  . Food insecurity - inability: Not on file  . Transportation needs - medical: Not on file  . Transportation needs - non-medical: Not on file  Occupational History  . Occupation: Personnel officerlectrician  Tobacco Use  . Smoking status: Former Smoker    Years: 3.50    Types: Cigarettes, E-cigarettes  . Smokeless tobacco: Never Used  Substance and Sexual Activity  . Alcohol use: No    Alcohol/week: 0.6 - 1.2 oz    Types: 1 - 2 Cans of beer per week    Comment: occ  . Drug use: Yes    Types: Marijuana    Comment: smokes daily  . Sexual activity: Yes    Partners: Female    Birth control/protection: None  Other Topics Concern  . Not on file  Social History Narrative  . Not  on file    Allergies: No Known Allergies  Metabolic Disorder Labs: Lab Results  Component Value Date   HGBA1C 5.5 07/06/2017   No results found for: PROLACTIN No results found for: CHOL, TRIG, HDL, CHOLHDL, VLDL, LDLCALC Lab Results  Component Value Date   TSH 2.093 06/08/2010    Therapeutic Level Labs: No results found for: LITHIUM No results found for: VALPROATE No components found for:  CBMZ  Current Medications: Current Outpatient Medications  Medication Sig Dispense Refill  . citalopram (CELEXA) 40 MG tablet Take 1 tablet (40 mg total) by mouth daily. 90 tablet 1  . glycopyrrolate (ROBINUL) 1 MG tablet Take 1 tablet (1 mg total) by mouth 2 (two) times daily. 60 tablet 6  . loratadine (CLARITIN) 10 MG tablet  Take 10 mg by mouth daily.    . mirtazapine (REMERON) 45 MG tablet Take 1 tablet (45 mg total) by mouth at bedtime. 90 tablet 1  . Multiple Vitamin (MULTIVITAMIN WITH MINERALS) TABS tablet Take 1 tablet by mouth daily.    Marland Kitchen omeprazole (PRILOSEC) 40 MG capsule Take 1 capsule by mouth every morning. 30 capsule 6   No current facility-administered medications for this visit.      Musculoskeletal: Strength & Muscle Tone: within normal limits Gait & Station: normal Patient leans: N/A  Psychiatric Specialty Exam: ROS  There were no vitals taken for this visit.There is no height or weight on file to calculate BMI.  General Appearance: Casual and Fairly Groomed  Eye Contact:  Fair  Speech:  Clear and Coherent  Volume:  Normal  Mood:  Euthymic  Affect:  Appropriate and Congruent  Thought Process:  Goal Directed and Descriptions of Associations: Intact  Orientation:  Full (Time, Place, and Person)  Thought Content: Logical   Suicidal Thoughts:  No  Homicidal Thoughts:  No  Memory:  Immediate;   Good  Judgement:  Good  Insight:  Good  Psychomotor Activity:  Normal  Concentration:  Concentration: Good  Recall:  Good  Fund of Knowledge: Good  Language: Good  Akathisia:  Negative  Handed:  Right  AIMS (if indicated): not done  Assets:  Communication Skills Desire for Improvement Financial Resources/Insurance Housing Intimacy Social Support Transportation Vocational/Educational  ADL's:  Intact  Cognition: WNL  Sleep:  Good   Screenings: PHQ2-9     [facility] Visit from 03/12/2017 in [facility] at [facility]  PHQ-2 Total Score  0       Assessment and Plan:  Jordan Christian reports that Celexa has been well tolerated and has provided some benefit in anxiety and irritability.  He continues to struggle with some breakthrough mood symptoms, and had an episode of panic approximately 3 weeks ago.  We agreed to increase Celexa as below, and continue to emphasize the importance of  reduction in cannabis and alcohol use.  He continues to work with his individual therapist, and has made steps to substantially reduce his cannabis use on an evening basis.  No acute safety issues, we will follow-up in 8-10 weeks.  1. Marijuana use, continuous   2. Mild episode of recurrent major depressive disorder (HCC)   3. Mood disorder (HCC)   4. Alcohol use disorder, mild, abuse     Status of current problems: stable  Labs Ordered: No orders of the defined types were placed in this encounter.   Labs Reviewed: Reviewed prior labs, CBC, CMP, B12, folate  Collateral Obtained/Records Reviewed: N/A  Plan:  Increase Celexa to 40 mg Continue Remeron  45 mg nightly Patient has neurology consultation scheduled for paresthesia and panic episode with aura   I spent 20 minutes with the patient in direct face-to-face clinical care.  Greater than 50% of this time was spent in counseling and coordination of care with the patient.    Burnard Leigh, MD 09/29/2017, 3:36 PM

## 2017-10-02 ENCOUNTER — Ambulatory Visit (HOSPITAL_COMMUNITY): Payer: 59 | Admitting: Licensed Clinical Social Worker

## 2017-10-02 DIAGNOSIS — F1290 Cannabis use, unspecified, uncomplicated: Secondary | ICD-10-CM | POA: Diagnosis not present

## 2017-10-02 DIAGNOSIS — F330 Major depressive disorder, recurrent, mild: Secondary | ICD-10-CM

## 2017-10-02 DIAGNOSIS — F1010 Alcohol abuse, uncomplicated: Secondary | ICD-10-CM | POA: Diagnosis not present

## 2017-10-05 ENCOUNTER — Encounter (HOSPITAL_COMMUNITY): Payer: Self-pay | Admitting: Licensed Clinical Social Worker

## 2017-10-05 NOTE — Progress Notes (Signed)
   THERAPIST PROGRESS NOTE  Session Time: 9:30-10  Participation Level: Active  Behavioral Response: Casual and NeatAlertEuthymic  Type of Therapy: Individual Therapy  Treatment Goals addressed: Anxiety and Diagnosis: SUD  Interventions: CBT  Summary: Jordan FiddlerJoshua D Christian is a 22 y.o. male who presents with excessive alcohol use, depression, and irritability. Pt arrives late to session and apologizes for "needing more sleep" since he has recently been fighting off a cold. Pt reports he is feeling secure at work and w/ his pay but his GF, Yvonna AlanisKaitlyn is "expressing concern again about his near daily drinking of beer and occasionally liquor". Pt states he believes he "is an alcoholic" but that "it's not gotten that bad yet".  Pt was asked why he keeps alcohol in the house if he wants to limit his drinking to which pt responded "he needs a little something every now and then". Pt reports he has tried to buy less and is only purchasing a 12pack of beer weekly. Pt was encouraged to continue this pattern. Counselor and pt discussed ways that pt can learn to "blow off steam, relax, or just have fun" w/o using substances. Pt continues to smoke weed but states his intake has been cut by 1/2 since Jan 2019. Pt discussed wanting to get into woodworking, Administratorleather working, and building a deck.  Suicidal/Homicidal: Nowithout intent/plan  Therapist Response: Pt was open and honest and appeared to express guilt for "not talking enough about alcohol in these sessions, since that is why he is here". Pt states his alcohol intake is a problem and he wants to limit himself and stick to not drinking daily. Counselor encouraged pt to continue limiting his buying of beer and talk openly w/ his GF about when he is feeling stressed or "in need of more drinks".  Plan: Return again in 2 weeks.  Diagnosis:    ICD-10-CM   1. Mild episode of recurrent major depressive disorder (HCC) F33.0   2. Marijuana use, continuous F12.90   3.  Alcohol use disorder, mild, abuse F10.10       Margo CommonWesley E Adeana Grilliot, LCAS-A 10/05/2017

## 2017-10-09 ENCOUNTER — Ambulatory Visit: Payer: Self-pay | Admitting: Neurology

## 2017-10-09 NOTE — Progress Notes (Deleted)
[facility] Clinic Note - Initial Visit   Date: 10/09/17  Jordan Christian MRN: [id number] DOB: [birth year]   Dear Dr Marland KitchenNeva Seat, Asencion Partridge, MD:  Thank you for your kind referral of Melina Fiddler for consultation of ***. Although *** history is well known to you, please allow [place] to reiterate it for the purpose of our medical record. The patient was accompanied to the clinic by *** who also provides collateral information.     History of Present Illness: Jordan Christian is a 22 y.o. ***-handed Caucasian/*** male with severe depression, *** presenting for evaluation of generalized tingling of the arms and legs.    Out-side paper records, electronic medical record, and images have been reviewed where available and summarized as: *** Lab Results  Component Value Date   TSH 2.093 06/08/2010   Lab Results  Component Value Date   VITAMINB12 606 07/06/2017   Lab Results  Component Value Date   HGBA1C 5.5 07/06/2017     Past Medical History:  Diagnosis Date  . Depression   . GERD (gastroesophageal reflux disease)     Past Surgical History:  Procedure Laterality Date  . WISDOM TOOTH EXTRACTION       Medications:  Outpatient Encounter Medications as of 10/09/2017  Medication Sig Note  . citalopram (CELEXA) 40 MG tablet Take 1 tablet (40 mg total) by mouth daily.   Marland Kitchen glycopyrrolate (ROBINUL) 1 MG tablet Take 1 tablet (1 mg total) by mouth 2 (two) times daily.   Marland Kitchen loratadine (CLARITIN) 10 MG tablet Take 10 mg by mouth daily. 03/12/2017: As needed.  . mirtazapine (REMERON) 45 MG tablet Take 1 tablet (45 mg total) by mouth at bedtime.   . Multiple Vitamin (MULTIVITAMIN WITH MINERALS) TABS tablet Take 1 tablet by mouth daily.   Marland Kitchen omeprazole (PRILOSEC) 40 MG capsule Take 1 capsule by mouth every morning.    No facility-administered encounter medications on file as of 10/09/2017.      Allergies: No Known Allergies  Family History: Family History  Adopted:  Yes    Social History: Social History   Tobacco Use  . Smoking status: Former Smoker    Years: 3.50    Types: Cigarettes, E-cigarettes  . Smokeless tobacco: Never Used  Substance Use Topics  . Alcohol use: No    Alcohol/week: 0.6 - 1.2 oz    Types: 1 - 2 Cans of beer per week    Comment: occ  . Drug use: Yes    Types: Marijuana    Comment: smokes daily   Social History   Social History Narrative  . Not on file    Review of Systems:  CONSTITUTIONAL: No fevers, chills, night sweats, or weight loss.  *** EYES: No visual changes or eye pain ENT: No hearing changes.  No history of nose bleeds.   RESPIRATORY: No cough, wheezing and shortness of breath.   CARDIOVASCULAR: Negative for chest pain, and palpitations.   GI: Negative for abdominal discomfort, blood in stools or black stools.  No recent change in bowel habits.   GU:  No history of incontinence.   MUSCLOSKELETAL: No history of joint pain or swelling.  No myalgias.   SKIN: Negative for lesions, rash, and itching.   HEMATOLOGY/ONCOLOGY: Negative for prolonged bleeding, bruising easily, and swollen nodes.  No history of cancer.   ENDOCRINE: Negative for cold or heat intolerance, polydipsia or goiter.   PSYCH:  ***depression or anxiety symptoms.   NEURO: As Above.  Vital Signs:  There were no vitals taken for this visit. Pain Scale: *** on a scale of 0-10   General Medical Exam:  *** General:  Well appearing, comfortable.   Eyes/ENT: see cranial nerve examination.   Neck: No masses appreciated.  Full range of motion without tenderness.  No carotid bruits. Respiratory:  Clear to auscultation, good air entry bilaterally.   Cardiac:  Regular rate and rhythm, no murmur.   Extremities:  No deformities, edema, or skin discoloration.  Skin:  No rashes or lesions.  Neurological Exam: MENTAL STATUS including orientation to time, place, person, recent and remote memory, attention span and concentration, language, and fund  of knowledge is ***normal.  Speech is not dysarthric.  CRANIAL NERVES: II:  No visual field defects.  Unremarkable fundi.   III-IV-VI: Pupils equal round and reactive to light.  Normal conjugate, extra-ocular eye movements in all directions of gaze.  No nystagmus.  No ptosis***.   V:  Normal facial sensation.  Jaw jerk is ***.   VII:  Normal facial symmetry and movements.  No pathologic facial reflexes.  VIII:  Normal hearing and vestibular function.   IX-X:  Normal palatal movement.   XI:  Normal shoulder shrug and head rotation.   XII:  Normal tongue strength and range of motion, no deviation or fasciculation.  MOTOR:  No atrophy, fasciculations or abnormal movements.  No pronator drift.  Tone is normal.    Right Upper Extremity:    Left Upper Extremity:    Deltoid  5/5   Deltoid  5/5   Biceps  5/5   Biceps  5/5   Triceps  5/5   Triceps  5/5   Wrist extensors  5/5   Wrist extensors  5/5   Wrist flexors  5/5   Wrist flexors  5/5   Finger extensors  5/5   Finger extensors  5/5   Finger flexors  5/5   Finger flexors  5/5   Dorsal interossei  5/5   Dorsal interossei  5/5   Abductor pollicis  5/5   Abductor pollicis  5/5   Tone (Ashworth scale)  0  Tone (Ashworth scale)  0   Right Lower Extremity:    Left Lower Extremity:    Hip flexors  5/5   Hip flexors  5/5   Hip extensors  5/5   Hip extensors  5/5   Knee flexors  5/5   Knee flexors  5/5   Knee extensors  5/5   Knee extensors  5/5   Dorsiflexors  5/5   Dorsiflexors  5/5   Plantarflexors  5/5   Plantarflexors  5/5   Toe extensors  5/5   Toe extensors  5/5   Toe flexors  5/5   Toe flexors  5/5   Tone (Ashworth scale)  0  Tone (Ashworth scale)  0   MSRs:  Right                                                                 Left brachioradialis 2+  brachioradialis 2+  biceps 2+  biceps 2+  triceps 2+  triceps 2+  patellar 2+  patellar 2+  ankle jerk 2+  ankle jerk 2+  Hoffman no  Hoffman no  plantar response  down  plantar  response down   SENSORY:  Normal and symmetric perception of light touch, pinprick, vibration, and proprioception.  Romberg's sign absent.   COORDINATION/GAIT: Normal finger-to- nose-finger and heel-to-shin.  Intact rapid alternating movements bilaterally.  Able to rise from a chair without using arms.  Gait narrow based and stable. Tandem and stressed gait intact.    IMPRESSION: ***  PLAN/RECOMMENDATIONS:  *** Return to clinic in *** months.   The duration of this appointment visit was *** minutes of face-to-face time with the patient.  Greater than 50% of this time was spent in counseling, explanation of diagnosis, planning of further management, and coordination of care.   Thank you for allowing me to participate in patient's care.  If I can answer any additional questions, I would be pleased to do so.    Sincerely,    Assad Harbeson K. Allena KatzPatel, DO

## 2017-10-16 ENCOUNTER — Ambulatory Visit (HOSPITAL_COMMUNITY): Payer: Self-pay | Admitting: Licensed Clinical Social Worker

## 2017-10-30 ENCOUNTER — Ambulatory Visit (INDEPENDENT_AMBULATORY_CARE_PROVIDER_SITE_OTHER): Payer: 59 | Admitting: Licensed Clinical Social Worker

## 2017-10-30 DIAGNOSIS — F330 Major depressive disorder, recurrent, mild: Secondary | ICD-10-CM

## 2017-10-30 DIAGNOSIS — F1290 Cannabis use, unspecified, uncomplicated: Secondary | ICD-10-CM

## 2017-10-30 DIAGNOSIS — F1010 Alcohol abuse, uncomplicated: Secondary | ICD-10-CM | POA: Diagnosis not present

## 2017-11-03 ENCOUNTER — Encounter (HOSPITAL_COMMUNITY): Payer: Self-pay | Admitting: Licensed Clinical Social Worker

## 2017-11-03 NOTE — Progress Notes (Signed)
   THERAPIST PROGRESS NOTE  Session Time: 9-10  Participation Level: Active  Behavioral Response: Casual and NeatAlertEuthymic  Type of Therapy: Individual Therapy  Treatment Goals addressed: Diagnosis: MDD, SUD  Interventions: CBT, Motivational Interviewing and Supportive  Summary: Jordan FiddlerJoshua D Coiner is a 22 y.o. male who presents with Alcohol Use Disorder, Mild, and Moderate Major Depression. He reports his past 2 weeks have been "ok". He continues to feel "overwhelmed at work" and reports he has implemented some steps in reducing his alcohol intake. He is now purchasing less alcohol per week and denies sneak drinking (as he has done in the past). Counselor and pt discuss anger, distress tolerance, and managing negative emotion states.   Counselor introduces "anger/mood swing thermometer" worksheet and does brief psychoed on gaining insight into the variability of anger "from 0-10". Pt is instructed to complete portion for his hw exercise w/ help of his girlfriend.  Suicidal/Homicidal: Nowithout intent/plan  Therapist Response: Counselor used CBT, information gathering, motivation enhancement techniques, MI, and challenging negative thinking. Counselor used brief psychoeducation to teach pt importance of "noticing small variations in emotions". Counselor will work to increase mind/body awareness techniques in further sessions.   Plan: Return again in 2 weeks.  Diagnosis:    ICD-10-CM   1. Mild episode of recurrent major depressive disorder (HCC) F33.0   2. Marijuana use, continuous F12.90   3. Alcohol use disorder, mild, abuse F10.10        Margo CommonWesley E Karalynn Cottone, LCAS-A 11/03/2017

## 2017-11-04 ENCOUNTER — Telehealth: Payer: Self-pay

## 2017-11-04 DIAGNOSIS — R161 Splenomegaly, not elsewhere classified: Secondary | ICD-10-CM

## 2017-11-04 NOTE — Telephone Encounter (Signed)
[facility] am.  He is asked to arrive at 6:45 and be NPO after midnight

## 2017-11-04 NOTE — Telephone Encounter (Signed)
-----   Message from Annett FabianSheri L Jones, RN sent at 05/04/2017  4:37 PM EDT ----- Needs US for abnormal spleen- stark

## 2017-11-13 ENCOUNTER — Ambulatory Visit (HOSPITAL_COMMUNITY): Payer: 59 | Attending: Gastroenterology

## 2017-11-13 ENCOUNTER — Ambulatory Visit (HOSPITAL_COMMUNITY): Payer: Self-pay | Admitting: Licensed Clinical Social Worker

## 2017-11-27 ENCOUNTER — Ambulatory Visit (INDEPENDENT_AMBULATORY_CARE_PROVIDER_SITE_OTHER): Payer: 59 | Admitting: Licensed Clinical Social Worker

## 2017-11-27 ENCOUNTER — Encounter (HOSPITAL_COMMUNITY): Payer: Self-pay | Admitting: Licensed Clinical Social Worker

## 2017-11-27 DIAGNOSIS — F1290 Cannabis use, unspecified, uncomplicated: Secondary | ICD-10-CM

## 2017-11-27 DIAGNOSIS — F330 Major depressive disorder, recurrent, mild: Secondary | ICD-10-CM | POA: Diagnosis not present

## 2017-11-27 NOTE — Progress Notes (Signed)
   THERAPIST PROGRESS NOTE  Session Time: 10-11  Participation Level: Active  Behavioral Response: CasualAlertEuthymic  Type of Therapy: Individual Therapy  Treatment Goals addressed: Diagnosis: MDD  Interventions: CBT and Motivational Interviewing  Summary: Jordan FiddlerJoshua D Christian is a 22 y.o. male who presents with depression and marijuana abuse mild. He states not much has changed. He continues to have mood swings which he describes as "getting frustrated at himself when he makes a mistake". He continues to drink alcohol and states he feels it is managed and he never drinks more than 12 standard drinks in a week. Smoking weed daily but about half of what he was doing, "spending around $60/week on weed w/ his gf". 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  "I struggle to find balance w/ my work and leisure".   "Things wouldn't be so hard for me if I had some friends to hang out w/". "[place] hanging out w/ old friends who were bad for me".   "I really don't have any friends or hobbies other than smoking and relaxing at home".   Discussed opportunities for getting hobbies such as Administratorleather work, joining a Librarian, academiccoworking space such as "[employer]". Pt plans on doing a small vegetable garden this summer.  Counselor spent time emphasizing importance of maintaining work/life balance, leisure opportunities, and nurturing adult forms of fun. Pt states he feels he is "getting better but needs to work on "doing enjoyable things for himself on a weekly basis."  Suicidal/Homicidal: Nowithout intent/plan  Therapist Response: Counselor used open questions and conjecture to imply that "pt was probably frustrated w/ counseling" since things are not really changing. Counselor reframed much of pt's pessimism into highlighting how pt is changing since he reported many ways he is changing. Counselor asked pt to set agenda for session and come up w/ goals for  between sessions.  Plan: Return again in 2  weeks.  Diagnosis:    ICD-10-CM   1. Mild episode of recurrent major depressive disorder (HCC) F33.0   2. Marijuana use, continuous F12.90      Margo CommonWesley E Matasha Smigelski, LCAS-A 11/27/2017

## 2017-12-07 DIAGNOSIS — S61002A Unspecified open wound of left thumb without damage to nail, initial encounter: Secondary | ICD-10-CM | POA: Diagnosis not present

## 2017-12-09 ENCOUNTER — Encounter (HOSPITAL_COMMUNITY): Payer: Self-pay | Admitting: Psychiatry

## 2017-12-09 ENCOUNTER — Ambulatory Visit (INDEPENDENT_AMBULATORY_CARE_PROVIDER_SITE_OTHER): Payer: 59 | Admitting: Psychiatry

## 2017-12-09 DIAGNOSIS — F39 Unspecified mood [affective] disorder: Secondary | ICD-10-CM

## 2017-12-09 DIAGNOSIS — F330 Major depressive disorder, recurrent, mild: Secondary | ICD-10-CM

## 2017-12-09 DIAGNOSIS — Z87891 Personal history of nicotine dependence: Secondary | ICD-10-CM | POA: Diagnosis not present

## 2017-12-09 DIAGNOSIS — F1290 Cannabis use, unspecified, uncomplicated: Secondary | ICD-10-CM | POA: Diagnosis not present

## 2017-12-09 DIAGNOSIS — F902 Attention-deficit hyperactivity disorder, combined type: Secondary | ICD-10-CM

## 2017-12-09 MED ORDER — METHYLPHENIDATE HCL 10 MG PO TABS: 10.0000 mg | ORAL_TABLET | Freq: Two times a day (BID) | ORAL | 0 refills | Status: DC

## 2017-12-09 MED ORDER — QUETIAPINE FUMARATE 25 MG PO TABS: ORAL_TABLET | ORAL | 0 refills | Status: DC

## 2017-12-09 MED ORDER — MIRTAZAPINE 45 MG PO TABS: 45.0000 mg | ORAL_TABLET | Freq: Every day | ORAL | 1 refills | Status: DC

## 2017-12-09 MED ORDER — CITALOPRAM HYDROBROMIDE 40 MG PO TABS: 40.0000 mg | ORAL_TABLET | Freq: Every day | ORAL | 1 refills | Status: DC

## 2017-12-09 NOTE — Progress Notes (Signed)
BH MD/PA/NP OP Progress Note  12/09/2017 3:29 PM Jordan Christian  MRN:  [id number]  Chief Complaint: attention, focus issues  HPI: XADRIAN CRAIGHEAD reports that he has decreased his marijuana use by 50%.  He is pretty proud of this, and does feel like his anxiety and irritability have been better controlled by Celexa.  He reports that his girlfriend told him to let me know that his attention and ability to get tasks done at home continues to be a problem.  Part of this is that he is quite exhausted by a long days work.  He continues to have some difficulty with attention at work and wants to make sure his focus is at a good place.  I spent time with him expressing concern about ongoing marijuana affecting his attention.  He would like to work on a plan to decrease and discontinue marijuana, but reports that when he does this after 2-3 days of not smoking, he has anxiety and nausea and irritability.  I suggested we use Seroquel for any breakthrough symptoms of marijuana withdrawal, and he was agreeable to this.  We agreed to continue Celexa and Remeron at the current doses.  And we also agreed to initiate Ritalin immediate release twice a day to help with his attention, focus, and some impulsive behaviors.   He reports that he is trying to grow up and get rid of some of his bad habits.  He drinks alcohol about 3 times a week and reports that he makes sure to keep track of his drinking so that he does not go over 12 drinks per week, and he is usually drinking less than that.  He continues to follow up with Wes, and makes a comment "probably could be doing a better job of seeing Wes more often."  I spent time expressing how impressed I am that he is thinking of things in such a mature manner, and I want to continue to support him in this.  Visit Diagnosis:    ICD-10-CM   1. Attention deficit hyperactivity disorder (ADHD), combined type F90.2 methylphenidate (RITALIN) 10 MG tablet    methylphenidate  (RITALIN) 10 MG tablet  2. Mild episode of recurrent major depressive disorder (HCC) F33.0 citalopram (CELEXA) 40 MG tablet    QUEtiapine (SEROQUEL) 25 MG tablet  3. Mood disorder (HCC) F39 mirtazapine (REMERON) 45 MG tablet    QUEtiapine (SEROQUEL) 25 MG tablet  4. Marijuana use, continuous F12.90 QUEtiapine (SEROQUEL) 25 MG tablet    Past Psychiatric History: See intake H&P for full details. Reviewed, with no updates at this time.   Past Medical History:  Past Medical History:  Diagnosis Date  . Depression   . GERD (gastroesophageal reflux disease)     Past Surgical History:  Procedure Laterality Date  . WISDOM TOOTH EXTRACTION      Family Psychiatric History: See intake H&P for full details. Reviewed, with no updates at this time.   Family History:  Family History  Adopted: Yes    Social History:  Social History   Socioeconomic History  . Marital status: Single    Spouse name: Not on file  . Number of children: Not on file  . Years of education: Not on file  . Highest education level: Not on file  Occupational History  . Occupation: Engineer, site  . Financial resource strain: Not on file  . Food insecurity:    Worry: Not on file    Inability: Not on file  .  Transportation needs:    Medical: Not on file    Non-medical: Not on file  Tobacco Use  . Smoking status: Former Smoker    Years: 3.50    Types: Cigarettes, E-cigarettes  . Smokeless tobacco: Never Used  Substance and Sexual Activity  . Alcohol use: No    Alcohol/week: 0.6 - 1.2 oz    Types: 1 - 2 Cans of beer per week    Comment: occ  . Drug use: Yes    Types: Marijuana    Comment: smokes daily  . Sexual activity: Yes    Partners: Female    Birth control/protection: None  Lifestyle  . Physical activity:    Days per week: Not on file    Minutes per session: Not on file  . Stress: Not on file  Relationships  . Social connections:    Talks on phone: Not on file    Gets together:  Not on file    Attends religious service: Not on file    Active member of club or organization: Not on file    Attends meetings of clubs or organizations: Not on file    Relationship status: Not on file  Other Topics Concern  . Not on file  Social History Narrative  . Not on file    Allergies: No Known Allergies  Metabolic Disorder Labs: Lab Results  Component Value Date   HGBA1C 5.5 07/06/2017   No results found for: PROLACTIN No results found for: CHOL, TRIG, HDL, CHOLHDL, VLDL, LDLCALC Lab Results  Component Value Date   TSH 2.093 06/08/2010    Therapeutic Level Labs: No results found for: LITHIUM No results found for: VALPROATE No components found for:  CBMZ  Current Medications: Current Outpatient Medications  Medication Sig Dispense Refill  . citalopram (CELEXA) 40 MG tablet Take 1 tablet (40 mg total) by mouth daily. 90 tablet 1  . glycopyrrolate (ROBINUL) 1 MG tablet Take 1 tablet (1 mg total) by mouth 2 (two) times daily. 60 tablet 6  . loratadine (CLARITIN) 10 MG tablet Take 10 mg by mouth daily.    . methylphenidate (RITALIN) 10 MG tablet Take 1 tablet (10 mg total) by mouth 2 (two) times daily with breakfast and lunch. 60 tablet 0  . [START ON 01/08/2018] methylphenidate (RITALIN) 10 MG tablet Take 1 tablet (10 mg total) by mouth 2 (two) times daily with breakfast and lunch. 60 tablet 0  . mirtazapine (REMERON) 45 MG tablet Take 1 tablet (45 mg total) by mouth at bedtime. 90 tablet 1  . Multiple Vitamin (MULTIVITAMIN WITH MINERALS) TABS tablet Take 1 tablet by mouth daily.    Marland Kitchen. omeprazole (PRILOSEC) 40 MG capsule Take 1 capsule by mouth every morning. 30 capsule 6  . QUEtiapine (SEROQUEL) 25 MG tablet Take up to 3 tablets daily for breakthrough anxiety, nausea 90 tablet 0   No current facility-administered medications for this visit.      Musculoskeletal: Strength & Muscle Tone: within normal limits Gait & Station: normal Patient leans: N/A  Psychiatric  Specialty Exam: ROS  There were no vitals taken for this visit.There is no height or weight on file to calculate BMI.  General Appearance: Casual and Fairly Groomed  Eye Contact:  Good  Speech:  Clear and Coherent and Normal Rate  Volume:  Normal  Mood:  Euthymic  Affect:  Appropriate and Congruent  Thought Process:  Coherent, Goal Directed and Descriptions of Associations: Intact  Orientation:  Full (Time, Place, and Person)  Thought Content: Logical   Suicidal Thoughts:  No  Homicidal Thoughts:  No  Memory:  Immediate;   Good  Judgement:  Good  Insight:  Good  Psychomotor Activity:  Normal  Concentration:  Concentration: Poor  Recall:  Good  Fund of Knowledge: Good  Language: Good  Akathisia:  Negative  Handed:  Right  AIMS (if indicated): not done  Assets:  Communication Skills Desire for Improvement Financial Resources/Insurance Housing Intimacy Social Support Transportation Vocational/Educational  ADL's:  Intact  Cognition: WNL  Sleep:  Good   Screenings: GAD-7     Counselor from 09/18/2017 in [facility]PAT[facility]  Total GAD-7 Score  16    PHQ2-9     Counselor from 09/18/2017 in BEHAVIORAL HEALTH OUTPATIENT THERAPY  Office Visit from 03/12/2017 in [facility] at [facility] Total Score  2  0  PHQ-9 Total Score  11  -       Assessment and Plan:  KRAVEN CALK presents for medication management follow-up.  He has made some strides in terms of his anger and irritability, and Celexa appears to have provided substantial benefit in improving his mood symptoms.  He continues on Remeron for sleep and appetite support.  We agreed to continue the current regimen with the addition of Ritalin 10 mg twice daily for attention.  He has previously been on Concerta with good results, but was frustrated that his appetite would become quite suppressed throughout the day.  The immediate release formulation will allow him to at least have some  reconstitution of his appetite in the middle of the day so he eats lunch.  He is trying to quit smoking marijuana altogether, but has had some difficulty when he is 2-3 days out from his last use, and he tends to struggle with anxiety, nausea, irritability.  We discussed the use of Seroquel as an augmenting agent over the next few months to help with those symptoms as he works towards quitting marijuana.  Continues in individual therapy and does not present any acute safety issues at this time.  1. Attention deficit hyperactivity disorder (ADHD), combined type   2. Mild episode of recurrent major depressive disorder (HCC)   3. Mood disorder (HCC)   4. Marijuana use, continuous     Status of current problems: gradually improving  Labs Ordered: No orders of the defined types were placed in this encounter.   Labs Reviewed: N/A  Collateral Obtained/Records Reviewed: N/A  Plan:  Continue Remeron 45 mg and Celexa 40 mg Initiate Ritalin 10 mg twice a day for long-standing ADHD Continue to encourage cannabis reduction and cessation Seroquel 25 mg 3 times daily as needed for breakthrough anxiety and irritability in the context of reducing marijuana use  I spent 15 minutes with the patient in direct face-to-face clinical care.  Greater than 50% of this time was spent in counseling and coordination of care with the patient.    Burnard Leigh, MD 12/09/2017, 3:29 PM

## 2017-12-16 ENCOUNTER — Ambulatory Visit (INDEPENDENT_AMBULATORY_CARE_PROVIDER_SITE_OTHER): Payer: 59 | Admitting: Licensed Clinical Social Worker

## 2017-12-16 DIAGNOSIS — F330 Major depressive disorder, recurrent, mild: Secondary | ICD-10-CM | POA: Diagnosis not present

## 2017-12-16 DIAGNOSIS — F1220 Cannabis dependence, uncomplicated: Secondary | ICD-10-CM

## 2017-12-16 DIAGNOSIS — F902 Attention-deficit hyperactivity disorder, combined type: Secondary | ICD-10-CM

## 2017-12-24 ENCOUNTER — Encounter (HOSPITAL_COMMUNITY): Payer: Self-pay | Admitting: Licensed Clinical Social Worker

## 2017-12-24 NOTE — Progress Notes (Signed)
   THERAPIST PROGRESS NOTE  Session Time: 2-3  Participation Level: Active  Behavioral Response: Fairly GroomedAlertEuthymic  Type of Therapy: Family Therapy  Treatment Goals addressed: Diagnosis: SUD  Interventions: CBT and Motivational Interviewing  Summary: Jordan FiddlerJoshua D Christian is a 22 y.o. male who presents with anxiety and cannabis use disorder, moderate. He appears sweaty, and tired. He is present w/ his longtime girlfriend in session. He is active and engaged in session and reports that he has been "feeling good and lots of energy lately since starting stimulant for ADHD". Pt then states he recently got pulled over for a minor traffic offense and the police officer smelled marijuana and searched the car. This resulted in pt getting charged w/ multiple misdemeanor possession charges since he had cannabis and paraphernalia in the car. Pt reports he is angry since he knows that "in another state this would not have been an issue". Counselor spends time pointing out all the consequences of pt's ongoing substance use. Pt states he has been let go of 2 jobs for failing UDS screenings. Pt reports he worries about getting into more accidents at work and being drug tested which causes him stress and anxiety. Pt reports his father was an alcoholic and pt verbalizes understanding that he has the genetic predisposition for chemical addiction. Pt states he and his girlfriend "do very little besides smoke weed in the morning and at night". Girlfriend confirms this dynamic and states she smokes often too and has considered trying to get a higher paying job but fears being drug tested and chooses to stay in her current position. Counselor spends time recommending sobriety, attending NA meetings, and meeting more people who have problems w/ substances. Counselor points out that harm reduction is not working since he is replacing his alcohol consumption w/ more weed consumption. Pt continues to drink alcohol near  daily and recently started stimulant therapy for his ADHD.   Pt states he believes "sobriety would be too hard" and is interested in limiting his mariajuana intake to "just on weekends". Counselor reflected that this is unlikely to achieve the desired results given pt's hx. Pt verbalizes understanding and chooses to pursue more harm reduction rather than sobriety.  Pt agrees to not smoke in the mornings before work and pt and girlfriend will attempt to do more meal prepping throughout the week to save money.  Suicidal/Homicidal: Nowithout intent/plan  Therapist Response: Counselor used open questions, MI to evoke change talk, emphasized pt's motivations to change his addictive bx, pointing out ways that his substance use has cause clinically significant problems. Counselor asked pt's girlfriend for her input on "possible changes she thinks they could make to their daily schedule". Counselor assessed pt's level of insight and attempted to work w/ pt at the "contemplation" level of "stages of change". Counselor had pt set limits and expectations for his continued substance use to review in f/u session.  Plan: Return again in 2 weeks.  Diagnosis:    ICD-10-CM   1. Mild episode of recurrent major depressive disorder (HCC) F33.0   2. Cannabis use disorder, moderate, dependence (HCC) F12.20   3. Attention deficit hyperactivity disorder (ADHD), combined type F90.2       Jordan Christian, LCAS-A 12/24/2017

## 2017-12-31 ENCOUNTER — Ambulatory Visit (HOSPITAL_COMMUNITY): Payer: Self-pay | Admitting: Licensed Clinical Social Worker

## 2018-01-01 ENCOUNTER — Ambulatory Visit (HOSPITAL_COMMUNITY): Payer: Self-pay | Admitting: Licensed Clinical Social Worker

## 2018-01-15 ENCOUNTER — Ambulatory Visit (HOSPITAL_COMMUNITY): Payer: Self-pay | Admitting: Licensed Clinical Social Worker

## 2018-01-18 ENCOUNTER — Ambulatory Visit (INDEPENDENT_AMBULATORY_CARE_PROVIDER_SITE_OTHER): Payer: 59 | Admitting: Licensed Clinical Social Worker

## 2018-01-18 DIAGNOSIS — F1220 Cannabis dependence, uncomplicated: Secondary | ICD-10-CM

## 2018-01-18 DIAGNOSIS — F330 Major depressive disorder, recurrent, mild: Secondary | ICD-10-CM

## 2018-01-21 ENCOUNTER — Encounter (HOSPITAL_COMMUNITY): Payer: Self-pay | Admitting: Licensed Clinical Social Worker

## 2018-01-21 NOTE — Progress Notes (Signed)
   THERAPIST PROGRESS NOTE  Session Time: 9-10  Participation Level: Active  Behavioral Response: Casual and Fairly GroomedAlertAnxious and Dysphoric  Type of Therapy: Individual Therapy  Treatment Goals addressed: Anxiety and Diagnosis: SUD  Interventions: CBT and Motivational Interviewing  Summary: Jordan Christian is a 22 y.o. male who presents with marijuana use disorder and MDD. He is active and engaged in session. Pt reports his mood is more stable and he is getting less irritated at work and at home. Pt continues to report use of increasing his frustration tolerance and viewing his anger "on a spectrum" while keeping anger from growing into rage. Pt reported he is noticing more when he feels "overwhelmed by noises and stress". Counselor discussed methods for grounding and introducing mindfulness into daily life. Counselor used MI OARS skills to work to resolve ambivalence towards stopping his daily marijuana use.  Suicidal/Homicidal: Nowithout intent/plan  Therapist Response: Counselor used open questions, validation, MI, challenging of negative thinking, supportive feedback, pointing out consequences of continued drug use.  Plan: Return again in 2 weeks.  Diagnosis:    ICD-10-CM   1. Cannabis use disorder, moderate, dependence (HCC) F12.20   2. Mild episode of recurrent major depressive disorder [facility]) F33.0      Margo Common, LCAS-A 01/21/2018

## 2018-02-04 ENCOUNTER — Ambulatory Visit (INDEPENDENT_AMBULATORY_CARE_PROVIDER_SITE_OTHER): Payer: 59 | Admitting: Licensed Clinical Social Worker

## 2018-02-04 DIAGNOSIS — F1220 Cannabis dependence, uncomplicated: Secondary | ICD-10-CM

## 2018-02-11 ENCOUNTER — Encounter (HOSPITAL_COMMUNITY): Payer: Self-pay | Admitting: Licensed Clinical Social Worker

## 2018-02-11 NOTE — Progress Notes (Signed)
   THERAPIST PROGRESS NOTE  Session Time: 11-12  Participation Level: Active  Behavioral Response: Casual and Well GroomedAlertEuthymic  Type of Therapy: Individual Therapy  Treatment Goals addressed: Diagnosis: Cannabis Use Disorder, Moderate  Interventions: CBT and Motivational Interviewing  Summary: Jordan FiddlerJoshua D Bann is a 22 y.o. male who presents with long hx of daily cannabis abuse, low mood, low frustration tolerance, and irritability. He states he has decided to stop smoking weed totally and is no longer buying weed and neither is his girlfriend. Pt states he now realizes he is addicted to it and it causes him to have low motivation, loss of interest, and keeps him from working towards a promotion for fear of being drug tested. I spent time congratulating pt on making a difficult decision and praising him for recognizing his desire to change his bx. I recommended pt discuss relapse prevention skills. Pt agreed this was a good idea. I provided psychoed on "anatomy of relapse" by describing the "trigger-though-craving-use" pattern common to relapse prevention. Pt appeared open and interested by conversation. He verbalized understanding that a "thought to use is a natural part of recovery". Counselor encouraged pt to identify at least 2 replacement bxs for smoking in the evenings.  Suicidal/Homicidal: Nowithout intent/plan  Therapist Response: Counselor used open questions, encouragement, and validation. Counselor used MI to highlight change talk while minimizing sustain talk. Counselor provided brief psychoed on relapse prevention w/ handout.  Plan: Return again in 2 weeks.  Diagnosis:    ICD-10-CM   1. Cannabis use disorder, moderate, dependence (HCC) F12.20        Margo CommonWesley E Alon Mazor, LCAS-A 02/11/2018

## 2018-02-17 ENCOUNTER — Other Ambulatory Visit (HOSPITAL_COMMUNITY): Payer: Self-pay | Admitting: Psychiatry

## 2018-02-17 ENCOUNTER — Encounter (HOSPITAL_COMMUNITY): Payer: Self-pay | Admitting: Psychiatry

## 2018-02-17 ENCOUNTER — Ambulatory Visit (INDEPENDENT_AMBULATORY_CARE_PROVIDER_SITE_OTHER): Payer: 59 | Admitting: Psychiatry

## 2018-02-17 DIAGNOSIS — F330 Major depressive disorder, recurrent, mild: Secondary | ICD-10-CM

## 2018-02-17 DIAGNOSIS — F902 Attention-deficit hyperactivity disorder, combined type: Secondary | ICD-10-CM

## 2018-02-17 DIAGNOSIS — F39 Unspecified mood [affective] disorder: Secondary | ICD-10-CM | POA: Diagnosis not present

## 2018-02-17 MED ORDER — METHYLPHENIDATE HCL 20 MG PO TABS: 20.0000 mg | ORAL_TABLET | Freq: Every day | ORAL | 0 refills | Status: DC

## 2018-02-17 MED ORDER — MIRTAZAPINE 45 MG PO TABS: 45.0000 mg | ORAL_TABLET | Freq: Every day | ORAL | 1 refills | Status: DC

## 2018-02-17 MED ORDER — CITALOPRAM HYDROBROMIDE 40 MG PO TABS: 40.0000 mg | ORAL_TABLET | Freq: Every day | ORAL | 1 refills | Status: DC

## 2018-02-17 MED ORDER — GUANFACINE HCL 1 MG PO TABS: 1.0000 mg | ORAL_TABLET | Freq: Two times a day (BID) | ORAL | 0 refills | Status: DC

## 2018-02-17 NOTE — Progress Notes (Signed)
Received error message, resending scripts

## 2018-02-17 NOTE — Progress Notes (Signed)
BH MD/PA/NP OP Progress Note  02/17/2018 9:22 AM Jordan Christian  MRN:  [id number]  Chief Complaint: med management  HPI: Jordan Christian reports he has had some legal issues as he was caught with possession of marijuana.  He reports that he was caught with possession of marijuana twice in a row, and reports that he has been having to work with a Clinical research associate regarding these misdemeanor charges.  He is now on 6 months unsupervised probation.  He reports that his marijuana use is still substantially less than it had been in the past, and during our last visit he reports that his marijuana use has not increased from our prior visit.  He still continues to use a few times per week, and he had been caught once with possession after he was pulled over for having expired license plates, and once while he and his colleagues were smoking marijuana after work and a group of them got in trouble for this.  He reports that his mood is holding up and he has been able to handle things okay, no acute safety concerns.  Reports that his sleep is been okay, but he is realizing more and more that he needs to put these behaviors behind him.  We discussed introducing Tenex 1 mg twice a day to help with anxiety and restlessness, given that he tends to use marijuana for this effect.  He tried the Seroquel but felt like it did the opposite of of the Ritalin, so it made the stimulant sort of useless.  We agreed to continue Remeron and Celexa as prescribed and follow-up in 8-10 weeks.  Disclosed to patient that this Clinical research associate is leaving this practice at the end of August 2019, and patients always has the right to choose their provider. Reassured patient that office will work to provide smooth transition of care whether they wish to remain at this office, or to continue with this provider, or seek alternative care options in community.  They expressed understanding.   Visit Diagnosis:    ICD-10-CM   1. Attention deficit hyperactivity  disorder (ADHD), combined type F90.2 methylphenidate (RITALIN) 20 MG tablet    methylphenidate (RITALIN) 20 MG tablet    methylphenidate (RITALIN) 20 MG tablet    guanFACINE (TENEX) 1 MG tablet    DISCONTINUED: guanFACINE (TENEX) 1 MG tablet    DISCONTINUED: methylphenidate (RITALIN) 20 MG tablet    DISCONTINUED: methylphenidate (RITALIN) 20 MG tablet    DISCONTINUED: methylphenidate (RITALIN) 20 MG tablet  2. Mood disorder (HCC) F39 mirtazapine (REMERON) 45 MG tablet    DISCONTINUED: mirtazapine (REMERON) 45 MG tablet  3. Mild episode of recurrent major depressive disorder (HCC) F33.0 citalopram (CELEXA) 40 MG tablet    DISCONTINUED: citalopram (CELEXA) 40 MG tablet    Past Psychiatric History: See intake H&P for full details. Reviewed, with no updates at this time.   Past Medical History:  Past Medical History:  Diagnosis Date  . Depression   . GERD (gastroesophageal reflux disease)     Past Surgical History:  Procedure Laterality Date  . WISDOM TOOTH EXTRACTION      Family Psychiatric History: See intake H&P for full details. Reviewed, with no updates at this time.   Family History:  Family History  Adopted: Yes    Social History:  Social History   Socioeconomic History  . Marital status: Single    Spouse name: Not on file  . Number of children: Not on file  . Years of education: Not  on file  . Highest education level: Not on file  Occupational History  . Occupation: Engineer, site  . Financial resource strain: Not on file  . Food insecurity:    Worry: Not on file    Inability: Not on file  . Transportation needs:    Medical: Not on file    Non-medical: Not on file  Tobacco Use  . Smoking status: Former Smoker    Years: 3.50    Types: Cigarettes, E-cigarettes  . Smokeless tobacco: Never Used  Substance and Sexual Activity  . Alcohol use: No    Alcohol/week: 0.6 - 1.2 oz    Types: 1 - 2 Cans of beer per week    Comment: occ  . Drug use:  Yes    Types: Marijuana    Comment: smokes daily  . Sexual activity: Yes    Partners: Female    Birth control/protection: None  Lifestyle  . Physical activity:    Days per week: Not on file    Minutes per session: Not on file  . Stress: Not on file  Relationships  . Social connections:    Talks on phone: Not on file    Gets together: Not on file    Attends religious service: Not on file    Active member of club or organization: Not on file    Attends meetings of clubs or organizations: Not on file    Relationship status: Not on file  Other Topics Concern  . Not on file  Social History Narrative  . Not on file    Allergies: No Known Allergies  Metabolic Disorder Labs: Lab Results  Component Value Date   HGBA1C 5.5 07/06/2017   No results found for: PROLACTIN No results found for: CHOL, TRIG, HDL, CHOLHDL, VLDL, LDLCALC Lab Results  Component Value Date   TSH 2.093 06/08/2010    Therapeutic Level Labs: No results found for: LITHIUM No results found for: VALPROATE No components found for:  CBMZ  Current Medications: Current Outpatient Medications  Medication Sig Dispense Refill  . citalopram (CELEXA) 40 MG tablet Take 1 tablet (40 mg total) by mouth daily. 90 tablet 1  . glycopyrrolate (ROBINUL) 1 MG tablet Take 1 tablet (1 mg total) by mouth 2 (two) times daily. 60 tablet 6  . guanFACINE (TENEX) 1 MG tablet Take 1 tablet (1 mg total) by mouth 2 (two) times daily. 180 tablet 0  . loratadine (CLARITIN) 10 MG tablet Take 10 mg by mouth daily.    . methylphenidate (RITALIN) 20 MG tablet Take 1 tablet (20 mg total) by mouth daily before breakfast. 30 tablet 0  . [START ON 04/20/2018] methylphenidate (RITALIN) 20 MG tablet Take 1 tablet (20 mg total) by mouth daily before breakfast. 30 tablet 0  . [START ON 03/20/2018] methylphenidate (RITALIN) 20 MG tablet Take 1 tablet (20 mg total) by mouth daily before breakfast. 30 tablet 0  . mirtazapine (REMERON) 45 MG tablet Take 1  tablet (45 mg total) by mouth at bedtime. 90 tablet 1  . Multiple Vitamin (MULTIVITAMIN WITH MINERALS) TABS tablet Take 1 tablet by mouth daily.    Marland Kitchen omeprazole (PRILOSEC) 40 MG capsule Take 1 capsule by mouth every morning. 30 capsule 6   No current facility-administered medications for this visit.      Musculoskeletal: Strength & Muscle Tone: within normal limits Gait & Station: normal Patient leans: N/A  Psychiatric Specialty Exam: ROS  There were no vitals taken for this visit.There is no  height or weight on file to calculate BMI.  General Appearance: Casual and Fairly Groomed  Eye Contact:  Good  Speech:  Clear and Coherent and Normal Rate  Volume:  Normal  Mood:  Euthymic  Affect:  Appropriate and Congruent  Thought Process:  Coherent, Goal Directed and Descriptions of Associations: Intact  Orientation:  Full (Time, Place, and Person)  Thought Content: Logical   Suicidal Thoughts:  No  Homicidal Thoughts:  No  Memory:  Immediate;   Good  Judgement:  Good  Insight:  Good  Psychomotor Activity:  Normal  Concentration:  Concentration: Poor  Recall:  Good  Fund of Knowledge: Good  Language: Good  Akathisia:  Negative  Handed:  Right  AIMS (if indicated): not done  Assets:  Communication Skills Desire for Improvement Financial Resources/Insurance Housing Intimacy Social Support Transportation Vocational/Educational  ADL's:  Intact  Cognition: WNL  Sleep:  Good   Screenings: GAD-7     Counselor from 09/18/2017 in BE[facility]PAT[facility]ERAP[facility] [place]  Total GAD-7 Score  16    PHQ2-9     Counselor from 09/18/2017 in BEHAVIORAL HEALTH OUTPATIENT THERAPY [place] Office Visit from 03/12/2017 in Primary Care at [place]  PHQ-2 Total Score  2  0  PHQ-9 Total Score  11  -       Assessment and Plan:  Jordan FiddlerJoshua D Christian presents with relatively stable mood and anxiety, particularly given that he has had 2 encounters with the law for possession of marijuana,  and is now having to face court dates and working with a Clinical research associatelawyer on this.  He reports that his marijuana use is substantially less than it has been over the past few years, so he feels frustrated in a way, but also recognizes that marijuana is illegal and it was inevitable that he might get caught with this.  His goal is to use marijuana perhaps 2-3 times per year on rare occasion rather than a few times per week like he is using now.  We agreed to initiate Tenex 1 mg twice a day to augment the Ritalin and provide some calming benefit, with the goal of him being able to better reduce his habit of using marijuana to calm his nervousness.  He has tolerated Ritalin well, with only a mild complaint of sexual side effects.  He has not had any negative palpitations, anxiety or agitation with the Ritalin.  Continue Celexa and Remeron as prescribed.  1. Attention deficit hyperactivity disorder (ADHD), combined type   2. Mood disorder (HCC)   3. Mild episode of recurrent major depressive disorder (HCC)     Status of current problems: gradually improving  Labs Ordered: No orders of the defined types were placed in this encounter.   Labs Reviewed: N/A  Collateral Obtained/Records Reviewed: N/A  Plan:  Continue Remeron 45 mg and Celexa 40 mg Ritalin 20 mg in the morning for ADHD Tenex 1 mg twice a day Continue to encourage cannabis reduction and cessation Patient will follow-up with writer in 8 weeks and transfer to another provider in office thereafter   Burnard LeighAlexander Arya Rogan Wigley, MD 02/17/2018, 9:22 AM

## 2018-02-17 NOTE — Addendum Note (Signed)
Addended by: Angus PalmsEKSIR, Angelee Bahr A on: 02/17/2018 09:28 AM   Modules accepted: Orders

## 2018-02-19 ENCOUNTER — Ambulatory Visit (INDEPENDENT_AMBULATORY_CARE_PROVIDER_SITE_OTHER): Payer: 59 | Admitting: Licensed Clinical Social Worker

## 2018-02-19 DIAGNOSIS — F1220 Cannabis dependence, uncomplicated: Secondary | ICD-10-CM

## 2018-02-19 DIAGNOSIS — F330 Major depressive disorder, recurrent, mild: Secondary | ICD-10-CM

## 2018-02-19 DIAGNOSIS — F1010 Alcohol abuse, uncomplicated: Secondary | ICD-10-CM

## 2018-02-19 NOTE — Progress Notes (Signed)
Pt did not show for session.  Dorann LodgeWes Swan, LPCA

## 2018-03-01 ENCOUNTER — Ambulatory Visit (INDEPENDENT_AMBULATORY_CARE_PROVIDER_SITE_OTHER): Payer: Self-pay | Admitting: Otolaryngology

## 2018-03-05 ENCOUNTER — Ambulatory Visit (INDEPENDENT_AMBULATORY_CARE_PROVIDER_SITE_OTHER): Payer: 59 | Admitting: Licensed Clinical Social Worker

## 2018-03-05 DIAGNOSIS — F330 Major depressive disorder, recurrent, mild: Secondary | ICD-10-CM

## 2018-03-05 DIAGNOSIS — F1220 Cannabis dependence, uncomplicated: Secondary | ICD-10-CM

## 2018-03-09 NOTE — Progress Notes (Signed)
Pt did not show for appointment.  Jordan Christian. LPCA LCASA 

## 2018-03-19 ENCOUNTER — Ambulatory Visit (INDEPENDENT_AMBULATORY_CARE_PROVIDER_SITE_OTHER): Payer: 59 | Admitting: Licensed Clinical Social Worker

## 2018-03-19 DIAGNOSIS — F330 Major depressive disorder, recurrent, mild: Secondary | ICD-10-CM

## 2018-03-19 DIAGNOSIS — F1220 Cannabis dependence, uncomplicated: Secondary | ICD-10-CM

## 2018-03-23 NOTE — Progress Notes (Signed)
Pt did not show for appointment. Counselor called and received VM. Counselor left message asking for pt to contact him w/ further instructions for continued service. Pt's future appointments will be removed since no contact has been made in over 2 weeks and pt no showed 2/2 last appointments.

## 2018-04-02 ENCOUNTER — Ambulatory Visit (HOSPITAL_COMMUNITY): Payer: Self-pay | Admitting: Licensed Clinical Social Worker

## 2018-04-08 ENCOUNTER — Encounter (HOSPITAL_COMMUNITY): Payer: Self-pay | Admitting: Licensed Clinical Social Worker

## 2018-04-08 ENCOUNTER — Telehealth (HOSPITAL_COMMUNITY): Payer: Self-pay | Admitting: Licensed Clinical Social Worker

## 2018-04-08 NOTE — Telephone Encounter (Signed)
Mailed discharge letter..tg

## 2018-04-15 ENCOUNTER — Ambulatory Visit (HOSPITAL_COMMUNITY): Payer: Self-pay | Admitting: Licensed Clinical Social Worker

## 2018-04-15 ENCOUNTER — Emergency Department (HOSPITAL_COMMUNITY): Admission: EM | Admit: 2018-04-15 | Discharge: 2018-04-15 | Discharge status: Absent without leave | Payer: Self-pay

## 2018-04-19 ENCOUNTER — Other Ambulatory Visit (HOSPITAL_COMMUNITY): Payer: Self-pay | Admitting: Psychiatry

## 2018-04-19 DIAGNOSIS — F330 Major depressive disorder, recurrent, mild: Secondary | ICD-10-CM

## 2018-04-19 DIAGNOSIS — F39 Unspecified mood [affective] disorder: Secondary | ICD-10-CM

## 2018-04-19 DIAGNOSIS — F1290 Cannabis use, unspecified, uncomplicated: Secondary | ICD-10-CM

## 2018-04-21 ENCOUNTER — Encounter (HOSPITAL_COMMUNITY): Payer: Self-pay | Admitting: Psychiatry

## 2018-04-21 ENCOUNTER — Ambulatory Visit (HOSPITAL_COMMUNITY): Payer: 59 | Admitting: Psychiatry

## 2018-04-21 DIAGNOSIS — F39 Unspecified mood [affective] disorder: Secondary | ICD-10-CM

## 2018-04-21 DIAGNOSIS — F330 Major depressive disorder, recurrent, mild: Secondary | ICD-10-CM

## 2018-04-21 DIAGNOSIS — F902 Attention-deficit hyperactivity disorder, combined type: Secondary | ICD-10-CM | POA: Diagnosis not present

## 2018-04-21 MED ORDER — MIRTAZAPINE 45 MG PO TABS: 45.0000 mg | ORAL_TABLET | Freq: Every day | ORAL | 0 refills | Status: DC

## 2018-04-21 MED ORDER — NALTREXONE HCL 50 MG PO TABS: 25.0000 mg | ORAL_TABLET | Freq: Every day | ORAL | 0 refills | Status: DC

## 2018-04-21 MED ORDER — GUANFACINE HCL 1 MG PO TABS: 1.0000 mg | ORAL_TABLET | Freq: Two times a day (BID) | ORAL | 0 refills | Status: DC

## 2018-04-21 MED ORDER — CITALOPRAM HYDROBROMIDE 40 MG PO TABS: 40.0000 mg | ORAL_TABLET | Freq: Every day | ORAL | 0 refills | Status: DC

## 2018-04-21 NOTE — Progress Notes (Signed)
BH MD/PA/NP OP Progress Note  04/21/2018 8:59 AM Jordan FiddlerJoshua D Christian  MRN:  [id number]  Chief Complaint: ready to change  HPI: Jordan Christian presents with his wife for a follow-up visit.  He reports that he got a DUI a couple weeks ago, he was taking Xanax.  He reports that he realizes that substances are running his life and he needs to stop.  He is going to be [facility], and his wife is supportive of this.  He denies any intentions or thoughts to harm himself, and wife agrees she is not worried about any acute safety issues.  He reports that this has been a wake-up call for him and he realizes he needs to grow up, and stop using any substances whatsoever.  We agreed to reintroduce naltrexone 25 mg to help with cravings which has been helpful for him in the past.  I also suggested that he discontinue the Ritalin given that his substance use has increased recently, and he was agreeable to this.  We agreed to continue Celexa, Remeron, and Intuniv and he will call this Clinical research associatewriter when he is discharged [facility] so that he can schedule follow-up visit.  I spent time with him discussing some of the role of grief and substance treatment, and being able to change some of his habits.  He reports that he is artery started to realize some of this and shares some of the habit replacements he has been working on.  He has not used any substances in the past week.  I offered him referral to the [facility], but he reports that he would [facility].  Visit Diagnosis:    ICD-10-CM   1. Attention deficit hyperactivity disorder (ADHD), combined type F90.2 guanFACINE (TENEX) 1 MG tablet    naltrexone (DEPADE) 50 MG tablet  2. Mild episode of recurrent major depressive disorder (HCC) F33.0 citalopram (CELEXA) 40 MG tablet    naltrexone (DEPADE) 50 MG tablet  3. Mood disorder (HCC) F39 mirtazapine (REMERON) 45 MG tablet    naltrexone  (DEPADE) 50 MG tablet    Past Psychiatric History: See intake H&P for full details. Reviewed, with no updates at this time.   Past Medical History:  Past Medical History:  Diagnosis Date  . Depression   . GERD (gastroesophageal reflux disease)     Past Surgical History:  Procedure Laterality Date  . WISDOM TOOTH EXTRACTION      Family Psychiatric History: See intake H&P for full details. Reviewed, with no updates at this time.   Family History:  Family History  Adopted: Yes    Social History:  Social History   Socioeconomic History  . Marital status: Single    Spouse name: Not on file  . Number of children: Not on file  . Years of education: Not on file  . Highest education level: Not on file  Occupational History  . Occupation: Engineer, sitelectrician  Social Needs  . Financial resource strain: Not on file  . Food insecurity:    Worry: Not on file    Inability: Not on file  . Transportation needs:    Medical: Not on file    Non-medical: Not on file  Tobacco Use  . Smoking status: Former Smoker    Years: 3.50    Types: Cigarettes, E-cigarettes  . Smokeless tobacco: Never Used  Substance and Sexual Activity  . Alcohol use: No    Alcohol/week: 1.0 -  2.0 standard drinks    Types: 1 - 2 Cans of beer per week    Comment: occ  . Drug use: Yes    Types: Marijuana    Comment: smokes daily  . Sexual activity: Yes    Partners: Female    Birth control/protection: None  Lifestyle  . Physical activity:    Days per week: Not on file    Minutes per session: Not on file  . Stress: Not on file  Relationships  . Social connections:    Talks on phone: Not on file    Gets together: Not on file    Attends religious service: Not on file    Active member of club or organization: Not on file    Attends meetings of clubs or organizations: Not on file    Relationship status: Not on file  Other Topics Concern  . Not on file  Social History Narrative  . Not on file     Allergies: No Known Allergies  Metabolic Disorder Labs: Lab Results  Component Value Date   HGBA1C 5.5 07/06/2017   No results found for: PROLACTIN No results found for: CHOL, TRIG, HDL, CHOLHDL, VLDL, LDLCALC Lab Results  Component Value Date   TSH 2.093 06/08/2010    Therapeutic Level Labs: No results found for: LITHIUM No results found for: VALPROATE No components found for:  CBMZ  Current Medications: Current Outpatient Medications  Medication Sig Dispense Refill  . citalopram (CELEXA) 40 MG tablet Take 1 tablet (40 mg total) by mouth daily. 90 tablet 0  . glycopyrrolate (ROBINUL) 1 MG tablet Take 1 tablet (1 mg total) by mouth 2 (two) times daily. 60 tablet 6  . guanFACINE (TENEX) 1 MG tablet Take 1 tablet (1 mg total) by mouth 2 (two) times daily. 180 tablet 0  . loratadine (CLARITIN) 10 MG tablet Take 10 mg by mouth daily.    . methylphenidate (RITALIN) 20 MG tablet Take 1 tablet (20 mg total) by mouth daily before breakfast. 30 tablet 0  . methylphenidate (RITALIN) 20 MG tablet Take 1 tablet (20 mg total) by mouth daily before breakfast. 30 tablet 0  . methylphenidate (RITALIN) 20 MG tablet Take 1 tablet (20 mg total) by mouth daily before breakfast. 30 tablet 0  . mirtazapine (REMERON) 45 MG tablet Take 1 tablet (45 mg total) by mouth at bedtime. 90 tablet 0  . Multiple Vitamin (MULTIVITAMIN WITH MINERALS) TABS tablet Take 1 tablet by mouth daily.    . naltrexone (DEPADE) 50 MG tablet Take 0.5 tablets (25 mg total) by mouth daily. 90 tablet 0  . omeprazole (PRILOSEC) 40 MG capsule Take 1 capsule by mouth every morning. 30 capsule 6   No current facility-administered medications for this visit.      Musculoskeletal: Strength & Muscle Tone: within normal limits Gait & Station: normal Patient leans: N/A  Psychiatric Specialty Exam: ROS  There were no vitals taken for this visit.There is no height or weight on file to calculate BMI.  General Appearance: Casual  and Fairly Groomed  Eye Contact:  Good  Speech:  Clear and Coherent and Normal Rate  Volume:  Normal  Mood:  Euthymic  Affect:  Appropriate and Congruent  Thought Process:  Coherent, Goal Directed and Descriptions of Associations: Intact  Orientation:  Full (Time, Place, and Person)  Thought Content: Logical   Suicidal Thoughts:  No  Homicidal Thoughts:  No  Memory:  Immediate;   Good  Judgement:  Good  Insight:  Good  Psychomotor Activity:  Normal  Concentration:  Concentration: Poor  Recall:  Good  Fund of Knowledge: Good  Language: Good  Akathisia:  Negative  Handed:  Right  AIMS (if indicated): not done  Assets:  Communication Skills Desire for Improvement Financial Resources/Insurance Housing Intimacy Social Support Transportation Vocational/Educational  ADL's:  Intact  Cognition: WNL  Sleep:  Good   Screenings: GAD-7     Counselor from 09/18/2017 in BEHAVIORAL HEALTH OUTPATIENT THERAPY [place]  Total GAD-7 Score  16    PHQ2-9     Counselor from 09/18/2017 in BEHAVIORAL HEALTH OUTPATIENT THERAPY [place] Office Visit from 03/12/2017 in Primary Care at [facility] Total Score  2  0  PHQ-9 Total Score  11  -       Assessment and Plan:  Jordan Christian presents with relatively stable mood and anxiety, particularly given the multiple legal issues he has had in the context of substance abuse.  He has been able to continue thriving at work and feels like the stimulant helps with his attention and focus, and he has never engaged in any abuse of stimulant medications.  Unfortunately given his ongoing alcohol and benzodiazepine abuse, we have agreed to discontinue the stimulant and he is going to be applying for admission to [facility] for voluntary rehab.  He recently got a DWI and is working with a Clinical research associate.  He expresses grief and shame, and wants to turn his behaviors around so that he is no longer getting into trouble, so that he can have a consistent job and be  there for his wife.  1. Attention deficit hyperactivity disorder (ADHD), combined type   2. Mild episode of recurrent major depressive disorder (HCC)   3. Mood disorder (HCC)     Status of current problems: gradually improving  Labs Ordered: No orders of the defined types were placed in this encounter.   Labs Reviewed: N/A  Collateral Obtained/Records Reviewed: wife present and provides collateral as above  Plan:  Continue Remeron 45 mg and Celexa 40 mg Tenex 1 mg twice a day Patient is applying for rehab admission to [facility] and will follow-up with this Clinical research associate after discharge   Burnard Leigh, MD 04/21/2018, 8:59 AM

## 2018-04-21 NOTE — Patient Instructions (Signed)
[email or web address] [address], [address] [place], [place] Phone: [phone] Fax: [phone]

## 2018-04-29 ENCOUNTER — Ambulatory Visit (HOSPITAL_COMMUNITY): Payer: Self-pay | Admitting: Licensed Clinical Social Worker

## 2018-05-06 ENCOUNTER — Telehealth (HOSPITAL_COMMUNITY): Payer: Self-pay | Admitting: Licensed Clinical Social Worker

## 2018-05-06 NOTE — Telephone Encounter (Signed)
Discharge Letter mailed due to repeated No Shows for Jordan Christian.

## 2018-09-19 ENCOUNTER — Other Ambulatory Visit: Payer: Self-pay | Admitting: Physician Assistant

## 2020-03-15 ENCOUNTER — Ambulatory Visit
Admission: EM | Admit: 2020-03-15 | Discharge: 2020-03-15 | Disposition: A | Discharge status: Home / Self Care | Payer: BC Managed Care – PPO | Attending: Emergency Medicine | Admitting: Emergency Medicine

## 2020-03-15 ENCOUNTER — Other Ambulatory Visit: Payer: Self-pay

## 2020-03-15 ENCOUNTER — Encounter: Payer: Self-pay | Admitting: Emergency Medicine

## 2020-03-15 DIAGNOSIS — H60392 Other infective otitis externa, left ear: Secondary | ICD-10-CM | POA: Diagnosis not present

## 2020-03-15 HISTORY — DX: Attention-deficit hyperactivity disorder, unspecified type: F90.9

## 2020-03-15 MED ORDER — CIPROFLOXACIN-DEXAMETHASONE 0.3-0.1 % OT SUSP: 4.0000 [drp] | Freq: Two times a day (BID) | OTIC | 0 refills | Status: DC

## 2020-03-15 MED ORDER — FLUTICASONE PROPIONATE 50 MCG/ACT NA SUSP: 1.0000 | Freq: Every day | NASAL | 0 refills | Status: DC

## 2020-03-15 NOTE — ED Provider Notes (Signed)
[facility]   [id number] 03/15/20 Arrival Time: 1914  Chief Complaint  Patient presents with   Otalgia     SUBJECTIVE: History from: patient.  Jordan Christian is a 24 y.o. male who presented to the urgent care with a complaint of left ear pain for the past 1 week.  Denies a precipitating event, such as swimming or wearing ear plugs.  Patient states the pain is constant and achy in character.  Patient has tried OTC medication without relief.  Symptoms are made worse with lying down. Denies similar symptoms in the past.  Denies fever, chills, fatigue, sinus pain, rhinorrhea, ear discharge, sore throat, SOB, wheezing, chest pain, nausea, changes in bowel or bladder habits.    ROS: As per HPI.  All other pertinent ROS negative.     Past Medical History:  Diagnosis Date   ADHD    Depression    GERD (gastroesophageal reflux disease)    Past Surgical History:  Procedure Laterality Date   WISDOM TOOTH EXTRACTION     No Known Allergies No current facility-administered medications on file prior to encounter.   Current Outpatient Medications on File Prior to Encounter  Medication Sig Dispense Refill   citalopram (CELEXA) 40 MG tablet Take 1 tablet (40 mg total) by mouth daily. 90 tablet 0   glycopyrrolate (ROBINUL) 1 MG tablet Take 1 tablet (1 mg total) by mouth 2 (two) times daily. 60 tablet 6   guanFACINE (TENEX) 1 MG tablet Take 1 tablet (1 mg total) by mouth 2 (two) times daily. 180 tablet 0   loratadine (CLARITIN) 10 MG tablet Take 10 mg by mouth daily.     methylphenidate (RITALIN) 20 MG tablet Take 1 tablet (20 mg total) by mouth daily before breakfast. 30 tablet 0   methylphenidate (RITALIN) 20 MG tablet Take 1 tablet (20 mg total) by mouth daily before breakfast. 30 tablet 0   methylphenidate (RITALIN) 20 MG tablet Take 1 tablet (20 mg total) by mouth daily before breakfast. 30 tablet 0   mirtazapine (REMERON) 45 MG tablet Take 1 tablet (45 mg total) by  mouth at bedtime. 90 tablet 0   Multiple Vitamin (MULTIVITAMIN WITH MINERALS) TABS tablet Take 1 tablet by mouth daily.     naltrexone (DEPADE) 50 MG tablet Take 0.5 tablets (25 mg total) by mouth daily. 90 tablet 0   omeprazole (PRILOSEC) 40 MG capsule TAKE 1 CAPSULE BY MOUTH ONCE DAILY IN THE MORNING 30 capsule 1   Social History   Socioeconomic History   Marital status: Single    Spouse name: Not on file   Number of children: Not on file   Years of education: Not on file   Highest education level: Not on file  Occupational History   Occupation: Personnel officer  Tobacco Use   Smoking status: Former Smoker    Years: 3.50    Types: Cigarettes, E-cigarettes   Smokeless tobacco: Never Used  Building services engineer Use: Every day  Substance and Sexual Activity   Alcohol use: No    Alcohol/week: 1.0 - 2.0 standard drink    Types: 1 - 2 Cans of beer per week    Comment: occ   Drug use: Yes    Types: Marijuana    Comment: occ   Sexual activity: Yes    Partners: Female    Birth control/protection: None  Other Topics Concern   Not on file  Social History Narrative   Not on file   Social Determinants  of Health   Financial Resource Strain:    Difficulty of Paying Living Expenses:   Food Insecurity:    Worried About Programme researcher, broadcasting/film/video in the Last Year:    Barista in the Last Year:   Transportation Needs:    Freight forwarder (Medical):    Lack of Transportation (Non-Medical):   Physical Activity:    Days of Exercise per Week:    Minutes of Exercise per Session:   Stress:    Feeling of Stress :   Social Connections:    Frequency of Communication with Friends and Family:    Frequency of Social Gatherings with Friends and Family:    Attends Religious Services:    Active Member of Clubs or Organizations:    Attends Engineer, structural:    Marital Status:   Intimate Partner Violence:    Fear of Current or Ex-Partner:     Emotionally Abused:    Physically Abused:    Sexually Abused:    Family History  Adopted: Yes    OBJECTIVE:  Vitals:   03/15/20 1925 03/15/20 1927  BP:  128/75  Pulse:  96  Resp:  18  Temp:  98.6 F (37 C)  TempSrc:  Oral  SpO2:  96%  Weight: 195 lb (88.5 kg)   Height: 5\' 5"  (1.651 m)      Physical Exam Vitals and nursing note reviewed.  Constitutional:      General: He is not in acute distress.    Appearance: Normal appearance. He is normal weight. He is not ill-appearing, toxic-appearing or diaphoretic.  HENT:     Head: Normocephalic.     Right Ear: Tympanic membrane, ear canal and external ear normal. There is no impacted cerumen.     Left Ear: Tympanic membrane, ear canal and external ear normal. Tenderness present. There is no impacted cerumen.     Ears:     Comments: Left ear :swelling ear canal    Nose: Nose normal. No congestion.     Mouth/Throat:     Mouth: Mucous membranes are moist.     Pharynx: No oropharyngeal exudate.  Cardiovascular:     Rate and Rhythm: Normal rate and regular rhythm.     Pulses: Normal pulses.     Heart sounds: Normal heart sounds. No murmur heard.  No friction rub. No gallop.   Pulmonary:     Effort: Pulmonary effort is normal. No respiratory distress.     Breath sounds: Normal breath sounds. No stridor. No wheezing, rhonchi or rales.  Chest:     Chest wall: No tenderness.  Neurological:     Mental Status: He is alert.      Imaging: Abdomen Complete  Result Date: 05/04/2017 CLINICAL DATA:  Generalized abdominal pain. EXAM: ABDOMEN ULTRASOUND COMPLETE COMPARISON:  No prior. FINDINGS: Gallbladder: No gallstones or wall thickening visualized. No sonographic Murphy sign noted by sonographer. Common bile duct: Diameter: 3 mm Liver: No focal lesion identified. Within normal limits in parenchymal echogenicity. Portal vein is patent on color Doppler imaging with normal direction of blood flow towards the liver. IVC: No  abnormality visualized. Pancreas: Visualized portion unremarkable. Spleen: Spleen is slightly prominent 9.4 cm Slightly heterogeneous parenchymal pattern. No focal abnormality identified. Right Kidney: Length: 11.7 cm. Echogenicity within normal limits. No mass or hydronephrosis visualized. Left Kidney: Length: 11.4 cm. Echogenicity within normal limits. No mass or hydronephrosis visualized. Abdominal aorta: No aneurysm visualized. Other findings: None. IMPRESSION: 1. Spleen is  slightly prominent at 9.4 cm. The splenic echotexture is slightly heterogeneous. No focal splenic mass lesion noted. Follow-up exam can be obtained to demonstrate stability and/or resolution to exclude infiltrating splenic process . 2. Exam otherwise unremarkable. No gallstones or biliary distention. Electronically Signed   By: Maisie Fus  Register   On: 05/04/2017 07:59     ASSESSMENT & PLAN:  1. Infective otitis externa of left ear     Meds ordered this encounter  Medications   ciprofloxacin-dexamethasone (CIPRODEX) OTIC suspension    Sig: Place 4 drops into the left ear 2 (two) times daily.    Dispense:  7.5 mL    Refill:  0   fluticasone (FLONASE) 50 MCG/ACT nasal spray    Sig: Place 1 spray into both nostrils daily for 14 days.    Dispense:  16 g    Refill:  0   Discharge instructions Rest and drink plenty of fluids Prescribed Flonase Prescribed ciprofloxacin ear drops Take medications as directed and to completion Continue to use OTC ibuprofen and/ or tylenol as needed for pain control Follow up with PCP if symptoms persists Return here or go to the ER if you have any new or worsening symptoms   Reviewed expectations re: course of current medical issues. Questions answered. Outlined signs and symptoms indicating need for more acute intervention. Patient verbalized understanding. After Visit Summary given.      Note: This document was prepared using Dragon voice recognition software and may include  unintentional dictation errors.    Durward Parcel, FNP 03/15/20 1946

## 2020-03-15 NOTE — Discharge Instructions (Signed)
Rest and drink plenty of fluids Prescribed Flonase Prescribed ciprofloxacin ear drops Take medications as directed and to completion Continue to use OTC ibuprofen and/ or tylenol as needed for pain control Follow up with PCP if symptoms persists Return here or go to the ER if you have any new or worsening symptom

## 2020-03-15 NOTE — ED Triage Notes (Signed)
LT ear has felt like water was in his ear x 1 week, today it has started throbbing.

## 2020-05-19 ENCOUNTER — Encounter: Payer: Self-pay | Admitting: Emergency Medicine

## 2020-05-19 ENCOUNTER — Ambulatory Visit
Admission: EM | Admit: 2020-05-19 | Discharge: 2020-05-19 | Disposition: A | Discharge status: Home / Self Care | Payer: BC Managed Care – PPO | Attending: Emergency Medicine | Admitting: Emergency Medicine

## 2020-05-19 ENCOUNTER — Other Ambulatory Visit: Payer: Self-pay

## 2020-05-19 DIAGNOSIS — J069 Acute upper respiratory infection, unspecified: Secondary | ICD-10-CM

## 2020-05-19 DIAGNOSIS — Z1152 Encounter for screening for COVID-19: Secondary | ICD-10-CM

## 2020-05-19 MED ORDER — CETIRIZINE HCL 10 MG PO TABS: 10.0000 mg | ORAL_TABLET | Freq: Every day | ORAL | 0 refills | Status: AC

## 2020-05-19 MED ORDER — BENZONATATE 100 MG PO CAPS: 100.0000 mg | ORAL_CAPSULE | Freq: Three times a day (TID) | ORAL | 0 refills | Status: DC

## 2020-05-19 MED ORDER — PREDNISONE 10 MG PO TABS: 20.0000 mg | ORAL_TABLET | Freq: Every day | ORAL | 0 refills | Status: DC

## 2020-05-19 MED ORDER — FLUTICASONE PROPIONATE 50 MCG/ACT NA SUSP: 1.0000 | Freq: Every day | NASAL | 0 refills | Status: AC

## 2020-05-19 NOTE — ED Provider Notes (Signed)
[facility]   [id number] 05/19/20 Arrival Time: 1445   CC: COVID symptoms  SUBJECTIVE: History from: patient.  Jordan Christian is a 24 y.o. male who presents to the urgent care for complaint of sore throat, cough, nasal congestion and runny nose for the past 2 to 3 days.  Denies sick exposure to COVID, flu or strep.  Denies recent travel.  Has tried OTC medication without relief.  Denies aggravating factor.  Denies previous symptoms in the past.   Denies fever, chills, fatigue, sinus pain, rhinorrhea, sore throat, SOB, wheezing, chest pain, nausea, changes in bowel or bladder habits.     ROS: As per HPI.  All other pertinent ROS negative.     Past Medical History:  Diagnosis Date  . ADHD   . Depression   . GERD (gastroesophageal reflux disease)    Past Surgical History:  Procedure Laterality Date  . WISDOM TOOTH EXTRACTION     No Known Allergies No current facility-administered medications on file prior to encounter.   Current Outpatient Medications on File Prior to Encounter  Medication Sig Dispense Refill  . ciprofloxacin-dexamethasone (CIPRODEX) OTIC suspension Place 4 drops into the left ear 2 (two) times daily. 7.5 mL 0  . citalopram (CELEXA) 40 MG tablet Take 1 tablet (40 mg total) by mouth daily. 90 tablet 0  . glycopyrrolate (ROBINUL) 1 MG tablet Take 1 tablet (1 mg total) by mouth 2 (two) times daily. 60 tablet 6  . guanFACINE (TENEX) 1 MG tablet Take 1 tablet (1 mg total) by mouth 2 (two) times daily. 180 tablet 0  . loratadine (CLARITIN) 10 MG tablet Take 10 mg by mouth daily.    . methylphenidate (RITALIN) 20 MG tablet Take 1 tablet (20 mg total) by mouth daily before breakfast. 30 tablet 0  . methylphenidate (RITALIN) 20 MG tablet Take 1 tablet (20 mg total) by mouth daily before breakfast. 30 tablet 0  . methylphenidate (RITALIN) 20 MG tablet Take 1 tablet (20 mg total) by mouth daily before breakfast. 30 tablet 0  . mirtazapine (REMERON) 45 MG tablet  Take 1 tablet (45 mg total) by mouth at bedtime. 90 tablet 0  . Multiple Vitamin (MULTIVITAMIN WITH MINERALS) TABS tablet Take 1 tablet by mouth daily.    . naltrexone (DEPADE) 50 MG tablet Take 0.5 tablets (25 mg total) by mouth daily. 90 tablet 0  . omeprazole (PRILOSEC) 40 MG capsule TAKE 1 CAPSULE BY MOUTH ONCE DAILY IN THE MORNING 30 capsule 1   Social History   Socioeconomic History  . Marital status: Single    Spouse name: Not on file  . Number of children: Not on file  . Years of education: Not on file  . Highest education level: Not on file  Occupational History  . Occupation: Personnel officer  Tobacco Use  . Smoking status: Former Smoker    Years: 3.50    Types: Cigarettes, E-cigarettes  . Smokeless tobacco: Never Used  Vaping Use  . Vaping Use: Every day  Substance and Sexual Activity  . Alcohol use: No    Alcohol/week: 1.0 - 2.0 standard drink    Types: 1 - 2 Cans of beer per week    Comment: occ  . Drug use: Yes    Types: Marijuana    Comment: occ  . Sexual activity: Yes    Partners: Female    Birth control/protection: None  Other Topics Concern  . Not on file  Social History Narrative  . Not on file  Social Determinants of Health   Financial Resource Strain:   . Difficulty of Paying Living Expenses: Not on file  Food Insecurity:   . Worried About Programme researcher, broadcasting/film/video in the Last Year: Not on file  . Ran Out of Food in the Last Year: Not on file  Transportation Needs:   . Lack of Transportation (Medical): Not on file  . Lack of Transportation (Non-Medical): Not on file  Physical Activity:   . Days of Exercise per Week: Not on file  . Minutes of Exercise per Session: Not on file  Stress:   . Feeling of Stress : Not on file  Social Connections:   . Frequency of Communication with Friends and Family: Not on file  . Frequency of Social Gatherings with Friends and Family: Not on file  . Attends Religious Services: Not on file  . Active Member of Clubs or  Organizations: Not on file  . Attends Banker Meetings: Not on file  . Marital Status: Not on file  Intimate Partner Violence:   . Fear of Current or Ex-Partner: Not on file  . Emotionally Abused: Not on file  . Physically Abused: Not on file  . Sexually Abused: Not on file   Family History  Adopted: Yes    OBJECTIVE:  Vitals:   05/19/20 1453  BP: (!) 143/74  Pulse: (!) 105  Resp: 16  Temp: 99.3 F (37.4 C)  TempSrc: Oral  SpO2: 95%     General appearance: alert; appears fatigued, but nontoxic; speaking in full sentences and tolerating own secretions HEENT: NCAT; Ears: EACs clear, TMs pearly gray; Eyes: PERRL.  EOM grossly intact. Sinuses: nontender; Nose: nares patent without rhinorrhea, Throat: oropharynx clear, tonsils non erythematous or enlarged, uvula midline  Neck: supple without LAD Lungs: unlabored respirations, symmetrical air entry; cough: moderate; no respiratory distress; CTAB Heart: regular rate and rhythm.  Radial pulses 2+ symmetrical bilaterally Skin: warm and dry Psychological: alert and cooperative; normal mood and affect  LABS:  No results found for this or any previous visit (from the past 24 hour(s)).   ASSESSMENT & PLAN:  1. URI with cough and congestion   2. Encounter for screening for COVID-19     Meds ordered this encounter  Medications  . cetirizine (ZYRTEC ALLERGY) 10 MG tablet    Sig: Take 1 tablet (10 mg total) by mouth daily.    Dispense:  30 tablet    Refill:  0  . fluticasone (FLONASE) 50 MCG/ACT nasal spray    Sig: Place 1 spray into both nostrils daily for 14 days.    Dispense:  16 g    Refill:  0  . benzonatate (TESSALON) 100 MG capsule    Sig: Take 1 capsule (100 mg total) by mouth every 8 (eight) hours.    Dispense:  30 capsule    Refill:  0  . predniSONE (DELTASONE) 10 MG tablet    Sig: Take 2 tablets (20 mg total) by mouth daily.    Dispense:  15 tablet    Refill:  0    Discharge Instructions  COVID  testing ordered.  It will take between 2-7 days for test results.  Someone will contact you regarding abnormal results.    In the meantime: You should remain isolated in your home for 10 days from symptom onset AND greater than 24 hours after symptoms resolution (absence of fever without the use of fever-reducing medication and improvement in respiratory symptoms), whichever is longer Get  plenty of rest and push fluids Tessalon Perles prescribed for cough Zyrtec for nasal congestion, runny nose, and/or sore throat Flonase for nasal congestion and runny nose Prednisone was prescribed Use medications daily for symptom relief Use OTC medications like ibuprofen or tylenol as needed fever or pain Call or go to the ED if you have any new or worsening symptoms such as fever, worsening cough, shortness of breath, chest tightness, chest pain, turning blue, changes in mental status, etc...   Reviewed expectations re: course of current medical issues. Questions answered. Outlined signs and symptoms indicating need for more acute intervention. Patient verbalized understanding. After Visit Summary given.      Note: This document was prepared using Dragon voice recognition software and may include unintentional dictation errors.    Durward Parcel, FNP 05/19/20 1537

## 2020-05-19 NOTE — ED Triage Notes (Signed)
sore throat, runny nose, sinus congestion (head pressure) x 2-3 days

## 2020-05-19 NOTE — Discharge Instructions (Signed)
COVID testing ordered.  It will take between 2-7 days for test results.  Someone will contact you regarding abnormal results.    In the meantime: You should remain isolated in your home for 10 days from symptom onset AND greater than 24  hours after symptoms resolution (absence of fever without the use of fever-reducing medication and improvement in respiratory symptoms), whichever is longer Get plenty of rest and push fluids Tessalon Perles prescribed for cough Zyrtec for nasal congestion, runny nose, and/or sore throat Flonase for nasal congestion and runny nose Prednisone was prescribed Use medications daily for symptom relief Use OTC medications like ibuprofen or tylenol as needed fever or pain Call or go to the ED if you have any new or worsening symptoms such as fever, worsening cough, shortness of breath, chest tightness, chest pain, turning blue, changes in mental status, etc...  

## 2020-06-11 ENCOUNTER — Encounter: Payer: Self-pay | Admitting: Adult Health

## 2020-06-11 ENCOUNTER — Ambulatory Visit (INDEPENDENT_AMBULATORY_CARE_PROVIDER_SITE_OTHER): Payer: BC Managed Care – PPO | Admitting: Adult Health

## 2020-06-11 ENCOUNTER — Other Ambulatory Visit: Payer: Self-pay

## 2020-06-11 VITALS — BP 132/74 | HR 62 | Ht 65.5 in | Wt 178.0 lb

## 2020-06-11 DIAGNOSIS — G4700 Insomnia, unspecified: Secondary | ICD-10-CM

## 2020-06-11 DIAGNOSIS — F319 Bipolar disorder, unspecified: Secondary | ICD-10-CM | POA: Diagnosis not present

## 2020-06-11 DIAGNOSIS — F411 Generalized anxiety disorder: Secondary | ICD-10-CM | POA: Diagnosis not present

## 2020-06-11 DIAGNOSIS — F909 Attention-deficit hyperactivity disorder, unspecified type: Secondary | ICD-10-CM

## 2020-06-11 MED ORDER — RISPERIDONE 1 MG PO TABS: ORAL_TABLET | ORAL | 2 refills | Status: DC

## 2020-06-11 NOTE — Progress Notes (Signed)
[facility] MD/PA/NP Initial Note  06/11/2020 9:39 AM ETAN VASUDEVAN  MRN:  [id number]  Chief Complaint:   HPI:   Accompanied by fiance.  Describes mood today as "so-so". Pleasant. Mood symptoms - reports depression at times, anxiety a lot of times, and irritability most of the time. Having panic attacks sometimes. Feels "unmotivated and not wanting to do things". Recently stayed at home for 2 weeks and didn't do anything but stay in bed. Did not want to do anything in the outside world. Says and does things he doesn't want to do. Stating "if someone pushes my buttons, I will reach out and touch them". Feels angry "most of the time". Having mood swings daily - can feel happy and mad - 3 to 7 times a day. Stating "I need to get the ups and downs more regulated". Has typically worked a lot of overtime but has decided to work no more than 40 hours. Stating "I can't just work and not devote time to me and my fiance". Stating "I have to change a lot of things I'm doing". Stable interest and motivation. Not taking medications at this time, but is willing to start.  Energy levels "really high and really low. Does not have a regular exercise routine. Walks 5 to 8 miles a day at work. Enjoys some usual interests and activities. 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 Engaged. Getting married in November on their 5th anniversary together. Has 4 cats and 1 dog. Her parents live in [place]. He lives close to family - grandparents 2 doors down. Spending time with family. Appetite decreased. Weight loss 40 pounds - 178 pounds. Sleeps well most nights. Averages 6 hours. Taking a while to get to sleep. Wakes up multiple times during the night. Can't turn mind off at night.  Focus and concentration "on and off throughout his life". Completing tasks. Managing aspects of household. Works full-time  Denies SI or HI.  Denies AH or VH. Smoking marijuana daily 3 bowls. Plans to decrease use.  Dinking one alcoholic beverage a  week. Has a history of over drinking. History of DUI.  Previous medication trials: Celexa, Remeron, Concerta, Intuniv  Visit Diagnosis:    ICD-10-CM   1. Generalized anxiety disorder  F41.1   2. Bipolar I disorder (HCC)  F31.9 risperiDONE (RISPERDAL) 1 MG tablet  3. Insomnia, unspecified type  G47.00   4. Attention deficit hyperactivity disorder (ADHD), unspecified ADHD type  F90.9     Past Psychiatric History: Admitted in teens for substance abuse.  Past Medical History:  Past Medical History:  Diagnosis Date  . ADHD   . Depression   . GERD (gastroesophageal reflux disease)     Past Surgical History:  Procedure Laterality Date  . WISDOM TOOTH EXTRACTION      Family Psychiatric History: Adopted - mother and sister bipolar. Father had substance abuse issues.  Family History:  Family History  Adopted: Yes    Social History:  Social History   Socioeconomic History  . Marital status: Single    Spouse name: Not on file  . Number of children: Not on file  . Years of education: Not on file  . Highest education level: Not on file  Occupational History  . Occupation: Personnel officer  Tobacco Use  . Smoking status: Former Smoker    Years: 3.50    Types: Cigarettes, E-cigarettes  . Smokeless tobacco: Never Used  Vaping Use  . Vaping Use: Every day  Substance and Sexual Activity  .  Alcohol use: No    Alcohol/week: 1.0 - 2.0 standard drink    Types: 1 - 2 Cans of beer per week    Comment: occ  . Drug use: Yes    Types: Marijuana    Comment: occ  . Sexual activity: Yes    Partners: Female    Birth control/protection: None  Other Topics Concern  . Not on file  Social History Narrative  . Not on file   Social Determinants of Health   Financial Resource Strain:   . Difficulty of Paying Living Expenses: Not on file  Food Insecurity:   . Worried About Programme researcher, broadcasting/film/video in the Last Year: Not on file  . Ran Out of Food in the Last Year: Not on file  Transportation  Needs:   . Lack of Transportation (Medical): Not on file  . Lack of Transportation (Non-Medical): Not on file  Physical Activity:   . Days of Exercise per Week: Not on file  . Minutes of Exercise per Session: Not on file  Stress:   . Feeling of Stress : Not on file  Social Connections:   . Frequency of Communication with Friends and Family: Not on file  . Frequency of Social Gatherings with Friends and Family: Not on file  . Attends Religious Services: Not on file  . Active Member of Clubs or Organizations: Not on file  . Attends Banker Meetings: Not on file  . Marital Status: Not on file    Allergies: No Known Allergies  Metabolic Disorder Labs: Lab Results  Component Value Date   HGBA1C 5.5 07/06/2017   No results found for: PROLACTIN No results found for: CHOL, TRIG, HDL, CHOLHDL, VLDL, LDLCALC Lab Results  Component Value Date   TSH 2.093 06/08/2010    Therapeutic Level Labs: No results found for: LITHIUM No results found for: VALPROATE No components found for:  CBMZ  Current Medications: Current Outpatient Medications  Medication Sig Dispense Refill  . benzonatate (TESSALON) 100 MG capsule Take 1 capsule (100 mg total) by mouth every 8 (eight) hours. 30 capsule 0  . cetirizine (ZYRTEC ALLERGY) 10 MG tablet Take 1 tablet (10 mg total) by mouth daily. 30 tablet 0  . fluticasone (FLONASE) 50 MCG/ACT nasal spray Place 1 spray into both nostrils daily for 14 days. 16 g 0  . glycopyrrolate (ROBINUL) 1 MG tablet Take 1 tablet (1 mg total) by mouth 2 (two) times daily. 60 tablet 6  . loratadine (CLARITIN) 10 MG tablet Take 10 mg by mouth daily.    . Multiple Vitamin (MULTIVITAMIN WITH MINERALS) TABS tablet Take 1 tablet by mouth daily.    Marland Kitchen omeprazole (PRILOSEC) 40 MG capsule TAKE 1 CAPSULE BY MOUTH ONCE DAILY IN THE MORNING 30 capsule 1  . predniSONE (DELTASONE) 10 MG tablet Take 2 tablets (20 mg total) by mouth daily. 15 tablet 0  . risperiDONE (RISPERDAL)  1 MG tablet Take 1/2 tablet at bedtime for 7 nights, then one tablet at bedtime. 30 tablet 2   No current facility-administered medications for this visit.    Medication Side Effects: none  Orders placed this visit:  No orders of the defined types were placed in this encounter.   Psychiatric Specialty Exam:  Review of Systems  Blood pressure 132/74, pulse 62, height 5' 5.5" (1.664 m), weight 178 lb (80.7 kg).Body mass index is 29.17 kg/m.  General Appearance: Casual and Neat  Eye Contact:  Good  Speech:  Clear and Coherent and  Normal Rate  Volume:  Normal  Mood:  Anxious and Irritable  Affect:  Appropriate and Congruent  Thought Process:  Coherent and Descriptions of Associations: Intact  Orientation:  Full (Time, Place, and Person)  Thought Content: Logical   Suicidal Thoughts:  No  Homicidal Thoughts:  No  Memory:  WNL  Judgement:  Good  Insight:  Good  Psychomotor Activity:  Normal  Concentration:  Concentration: Good  Recall:  Good  Fund of Knowledge: Good  Language: Good  Assets:  Communication Skills Desire for Improvement Financial Resources/Insurance Housing Intimacy Leisure Time Physical Health Resilience Social Support Talents/Skills Transportation Vocational/Educational  ADL's:  Intact  Cognition: WNL  Prognosis:  Good   Screenings:  GAD-7     Counselor from 09/18/2017 in BEHAVIORAL HEALTH OUTPATIENT THERAPY [place]  Total GAD-7 Score 16    PHQ2-9     Counselor from 09/18/2017 in BEHAVIORAL HEALTH OUTPATIENT THERAPY [place] Office Visit from 03/12/2017 in Primary Care at [facility] Total Score 2 0  PHQ-9 Total Score 11 --      Receiving Psychotherapy: No   Treatment Plan/Recommendations:   Plan:  PDMP reviewed  1. Add Risperdal 1mg  - take 1/2 tablet at hs x 7 days, then one tablet daily.  Discussed discontinuation of marijuana.  Will not prescribe any controlled substances at this time.   RTC 4 weeks  Patient advised to  contact office with any questions, adverse effects, or acute worsening in signs and symptoms.  Discussed potential metabolic side effects associated with atypical antipsychotics, as well as potential risk for movement side effects. Advised pt to contact office if movement side effects occur.     , NP

## 2020-06-13 ENCOUNTER — Other Ambulatory Visit: Payer: Self-pay

## 2020-06-13 ENCOUNTER — Ambulatory Visit (INDEPENDENT_AMBULATORY_CARE_PROVIDER_SITE_OTHER): Payer: BC Managed Care – PPO | Admitting: Psychiatry

## 2020-06-13 ENCOUNTER — Encounter: Payer: Self-pay | Admitting: Psychiatry

## 2020-06-13 DIAGNOSIS — F319 Bipolar disorder, unspecified: Secondary | ICD-10-CM | POA: Diagnosis not present
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
## 2020-06-18 ENCOUNTER — Telehealth: Payer: Self-pay | Admitting: Psychiatry

## 2020-06-18 NOTE — Telephone Encounter (Signed)
Error

## 2020-07-06 ENCOUNTER — Ambulatory Visit: Payer: BC Managed Care – PPO | Admitting: Psychiatry

## 2020-07-09 ENCOUNTER — Telehealth: Payer: Self-pay | Admitting: Adult Health

## 2020-07-09 ENCOUNTER — Other Ambulatory Visit: Payer: Self-pay

## 2020-07-09 ENCOUNTER — Encounter: Payer: Self-pay | Admitting: Adult Health

## 2020-07-09 ENCOUNTER — Ambulatory Visit (INDEPENDENT_AMBULATORY_CARE_PROVIDER_SITE_OTHER): Payer: BC Managed Care – PPO | Admitting: Adult Health

## 2020-07-09 DIAGNOSIS — F319 Bipolar disorder, unspecified: Secondary | ICD-10-CM

## 2020-07-09 DIAGNOSIS — F411 Generalized anxiety disorder: Secondary | ICD-10-CM

## 2020-07-09 DIAGNOSIS — G4700 Insomnia, unspecified: Secondary | ICD-10-CM

## 2020-07-09 DIAGNOSIS — F909 Attention-deficit hyperactivity disorder, unspecified type: Secondary | ICD-10-CM

## 2020-07-09 MED ORDER — RISPERIDONE 2 MG PO TABS: ORAL_TABLET | ORAL | 2 refills | Status: DC

## 2020-07-09 NOTE — Progress Notes (Signed)
SANTONIO SPEAKMAN [id number] Jan 11, 1996 24 y.o.  Subjective:   Patient ID:  Jordan Christian is a 24 y.o. (DOB 09/01/1996) male.  Chief Complaint: No chief complaint on file.   HPI Jordan Christian presents to the office today for follow-up of MDD, GAD, ADHD, insomnia.  Describes mood today as "improved". Pleasant. Mood symptoms - reports depression, anxiety and irritability. Stating "it all shines through at times. Denies panic attacks - feels more stressed at times. Mood swings are better. Decreased anger - not all the time. Working with therapist - writing down thoughts and feelings. Feels like addition of Risperdal was more helpful in the first few weeks. Increased stress with upcoming wedding. Recent trip to [place] to visit mother in Social worker. Stable interest and motivation. Taking Risperdal as prescribed. Energy levels "normal". Does not have a regular exercise routine.  Enjoys some usual interests and activities. Engaged. Getting married on November 20th. Has 4 cats and 1 dog. Her parents live in [place]. He lives close to family - grandparents 2 doors down. Spending time with family. Appetite decreased. Weight loss 7 pounds - 167 pounds. Sleeps well most nights. Averages 6 hours - more restful. Not waking up as much during the night.  Focus and concentration difficulties. Completing tasks. Managing aspects of household. Works full-time  Denies SI or HI.  Denies AH or VH. Smoking marijuana daily - quite a bit. Plans to decrease use.  Alcohol use.  Previous medication trials: Celexa, Remeron, Concerta, Intuniv  GAD-7     Counselor from 09/18/2017 in BEHAVIORAL HEALTH OUTPATIENT THERAPY [place]  Total GAD-7 Score 16    PHQ2-9     Counselor from 09/18/2017 in BEHAVIORAL HEALTH OUTPATIENT THERAPY [place] Office Visit from 03/12/2017 in Primary Care at [facility] Total Score 2 0  PHQ-9 Total Score 11 --       Review of Systems:  Review of Systems  Musculoskeletal: Negative for  gait problem.  Neurological: Negative for tremors.  Psychiatric/Behavioral:       Please refer to HPI    Medications: I have reviewed the patient's current medications.  Current Outpatient Medications  Medication Sig Dispense Refill   benzonatate (TESSALON) 100 MG capsule Take 1 capsule (100 mg total) by mouth every 8 (eight) hours. 30 capsule 0   cetirizine (ZYRTEC ALLERGY) 10 MG tablet Take 1 tablet (10 mg total) by mouth daily. 30 tablet 0   fluticasone (FLONASE) 50 MCG/ACT nasal spray Place 1 spray into both nostrils daily for 14 days. 16 g 0   glycopyrrolate (ROBINUL) 1 MG tablet Take 1 tablet (1 mg total) by mouth 2 (two) times daily. 60 tablet 6   loratadine (CLARITIN) 10 MG tablet Take 10 mg by mouth daily.     Multiple Vitamin (MULTIVITAMIN WITH MINERALS) TABS tablet Take 1 tablet by mouth daily.     omeprazole (PRILOSEC) 40 MG capsule TAKE 1 CAPSULE BY MOUTH ONCE DAILY IN THE MORNING 30 capsule 1   predniSONE (DELTASONE) 10 MG tablet Take 2 tablets (20 mg total) by mouth daily. 15 tablet 0   risperiDONE (RISPERDAL) 2 MG tablet Take 1/2 tablet at bedtime for 7 nights, then one tablet at bedtime. 30 tablet 2   No current facility-administered medications for this visit.    Medication Side Effects: None  Allergies: No Known Allergies  Past Medical History:  Diagnosis Date   ADHD    Depression    GERD (gastroesophageal reflux disease)     Family History  Adopted: Yes  Problem Relation Age of Onset   Bipolar disorder Mother    Bipolar disorder Sister    Alcohol abuse Father     Social History   Socioeconomic History   Marital status: Single    Spouse name: Not on file   Number of children: Not on file   Years of education: Not on file   Highest education level: Not on file  Occupational History   Occupation: Personnel officer  Tobacco Use   Smoking status: Former Smoker    Years: 3.50    Types: Cigarettes, E-cigarettes   Smokeless tobacco:  Never Used  Building services engineer Use: Every day  Substance and Sexual Activity   Alcohol use: No    Alcohol/week: 1.0 - 2.0 standard drink    Types: 1 - 2 Cans of beer per week    Comment: occ   Drug use: Yes    Types: Marijuana    Comment: occ   Sexual activity: Yes    Partners: Female    Birth control/protection: None  Other Topics Concern   Not on file  Social History Narrative   Not on file   Social Determinants of Health   Financial Resource Strain:    Difficulty of Paying Living Expenses: Not on file  Food Insecurity:    Worried About Programme researcher, broadcasting/film/video in the Last Year: Not on file   [employer] of Food in the Last Year: Not on file  Transportation Needs:    Lack of Transportation (Medical): Not on file   Lack of Transportation (Non-Medical): Not on file  Physical Activity:    Days of Exercise per Week: Not on file   Minutes of Exercise per Session: Not on file  Stress:    Feeling of Stress : Not on file  Social Connections:    Frequency of Communication with Friends and Family: Not on file   Frequency of Social Gatherings with Friends and Family: Not on file   Attends Religious Services: Not on file   Active Member of Clubs or Organizations: Not on file   Attends Banker Meetings: Not on file   Marital Status: Not on file  Intimate Partner Violence:    Fear of Current or Ex-Partner: Not on file   Emotionally Abused: Not on file   Physically Abused: Not on file   Sexually Abused: Not on file    Past Medical History, Surgical history, Social history, and Family history were reviewed and updated as appropriate.   Please see review of systems for further details on the patient's review from today.   Objective:   Physical Exam:  There were no vitals taken for this visit.  Physical Exam Constitutional:      General: He is not in acute distress. Musculoskeletal:        General: No deformity.  Neurological:     Mental  Status: He is alert and oriented to person, place, and time.     Coordination: Coordination normal.  Psychiatric:        Attention and Perception: Attention and perception normal. He does not perceive auditory or visual hallucinations.        Mood and Affect: Mood normal. Mood is not anxious or depressed. Affect is not labile, blunt, angry or inappropriate.        Speech: Speech normal.        Behavior: Behavior normal.        Thought Content: Thought content normal. Thought  content is not paranoid or delusional. Thought content does not include homicidal or suicidal ideation. Thought content does not include homicidal or suicidal plan.        Cognition and Memory: Cognition and memory normal.        Judgment: Judgment normal.     Comments: Insight intact     Lab Review:     Component Value Date/Time   NA 141 03/14/2017 1350   K 5.3 (H) 03/14/2017 1350   CL 101 03/14/2017 1350   CO2 26 03/14/2017 1350   GLUCOSE 92 03/14/2017 1350   GLUCOSE 149 (H) 11/30/2010 2143   BUN 7 03/14/2017 1350   CREATININE 0.92 03/14/2017 1350   CALCIUM 9.3 03/14/2017 1350   PROT 7.0 03/14/2017 1350   ALBUMIN 4.7 03/14/2017 1350   AST 15 03/14/2017 1350   ALT 12 03/14/2017 1350   ALKPHOS 68 03/14/2017 1350   BILITOT 0.2 03/14/2017 1350   GFRNONAA 118 03/14/2017 1350   GFRAA 137 03/14/2017 1350       Component Value Date/Time   WBC 5.2 07/06/2017 1615   WBC 5.6 11/30/2010 2143   RBC 4.56 07/06/2017 1615   RBC 4.87 11/30/2010 2143   HGB 13.8 07/06/2017 1615   HCT 40.3 07/06/2017 1615   PLT 224 07/06/2017 1615   MCV 88 07/06/2017 1615   MCH 30.3 07/06/2017 1615   MCH 29.4 11/30/2010 2143   MCHC 34.2 07/06/2017 1615   MCHC 33.8 11/30/2010 2143   RDW 13.5 07/06/2017 1615   LYMPHSABS 1.8 07/06/2017 1615   MONOABS 0.4 11/30/2010 2143   EOSABS 0.2 07/06/2017 1615   BASOSABS 0.0 07/06/2017 1615    No results found for: POCLITH, LITHIUM   No results found for: PHENYTOIN, PHENOBARB,  VALPROATE, CBMZ   .res Assessment: Plan:    Plan:  PDMP reviewed  1. Risperdal 1mg  to 2mg  - one tablet daily. Will take 1.5mg  at hs x 4 nights, then increase to 2mg .  Discussed discontinuation of marijuana.  Will not prescribe any controlled substances at this time.   RTC 4 weeks  Patient advised to contact office with any questions, adverse effects, or acute worsening in signs and symptoms.  Discussed potential metabolic side effects associated with atypical antipsychotics, as well as potential risk for movement side effects. Advised pt to contact office if movement side effects occur.    Diagnoses and all orders for this visit:  Bipolar I disorder (HCC) -     risperiDONE (RISPERDAL) 2 MG tablet; Take 1/2 tablet at bedtime for 7 nights, then one tablet at bedtime.  Generalized anxiety disorder  Insomnia, unspecified type  Attention deficit hyperactivity disorder (ADHD), unspecified ADHD type     Please see After Visit Summary for patient specific instructions.  Future Appointments  Date Time Provider Department Center  07/12/2020  8:00 AM , [facility] CP-[facility] None  07/18/2020  5:00 PM Stevphen Meuse, [facility] CP-CP None  08/01/2020  5:00 PM Stevphen Meuse, [facility] CP-CP None    No orders of the defined types were placed in this encounter.   -------------------------------

## 2020-07-09 NOTE — Telephone Encounter (Signed)
Increased Risperdal today from 1mg  to 2mg  at hs.

## 2020-07-09 NOTE — Telephone Encounter (Signed)
Pt was in today to see gina and he called to say that he forgot to tell gina that he has no motivation and feels helpless. He goes to work but it is hard and some days are harder than others, He is getting married in 20 days and he needs some type of medicine to help him get all that he needs to fget done before the wedding. [facility] pharmacy is [facility] [place]. If you have any questions [phone]

## 2020-07-12 ENCOUNTER — Other Ambulatory Visit: Payer: Self-pay

## 2020-07-12 ENCOUNTER — Ambulatory Visit (INDEPENDENT_AMBULATORY_CARE_PROVIDER_SITE_OTHER): Payer: BC Managed Care – PPO | Admitting: Psychiatry

## 2020-07-12 DIAGNOSIS — F319 Bipolar disorder, unspecified: Secondary | ICD-10-CM

## 2020-07-12 NOTE — Progress Notes (Signed)
      [facility] Counselor/Therapist Progress Note  Patient ID: Jordan Christian, MRN: [id number],    Date: 07/12/2020  Time Spent: 48 minutes start time 8:12 AM end time 9 AM  Treatment Type: Individual Therapy  Reported Symptoms: sadness, anxiety, anger, frustration, sleep issues, racing thoughts, fatigue, low motivation, difficulty completing tasks  Mental Status Exam:  Appearance:   Casual and Neat     Behavior:  Appropriate  Motor:  Normal  Speech/Language:   Normal Rate  Affect:  Appropriate  Mood:  depressed  Thought process:  normal  Thought content:    WNL  Sensory/Perceptual disturbances:    WNL  Orientation:  oriented to person, place, time/date and situation  Attention:  Good  Concentration:  Good  Memory:  WNL  Fund of knowledge:   Good  Insight:    Fair  Judgment:   Fair  Impulse Control:  Fair   Risk Assessment: Danger to Self:  No Self-injurious Behavior: No Danger to Others: No Duty to Warn:no Physical Aggression / Violence:No  Access to Firearms a concern: No  Gang Involvement:No   Subjective: Patient was present for session.  He reported that he is struggling because he isn't working currently.  He shared that he is having a hard time keeping a job due to not having the vaccination.   Discussed different possibilities with patient concerning his skills and being able to find some work even though it may be difficult.  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  Encouraged him to focus on studying for his test and doing things that he can rather than focusing on what is not happening currently.  Patient developed treatment plan and goals in session did not sign due to coronavirus.  Patient reported he feels that fear and anxiety is what leads to his depression so his anxiety is what he wanted to address in treatment.  Taught patient grounded and 5 exercise and how to use it as emotions that are negative surface.   Also discussed ways to talk himself through the not wanting to exercise and get out of bed to do the things that he knows make him feel better.  Patient reported feeling better at the end of session and agreed to continue working on his writing exercising and using his self talk to get him to accomplish at least a few goals each day.  Patient is also to work on removing himself when he starts feeling his body get very tense before he gets to the explosion point.  Interventions: Cognitive Behavioral Therapy and Solution-Oriented/Positive Psychology  Diagnosis:   ICD-10-CM   1. Bipolar I disorder (HCC)  F31.9     Plan: Patient is to use CBT and coping skills to decrease anxiety and depression symptoms.  Patient is to work on exercising and journaling regularly to release emotions appropriately.  Patient is going to use self talk to keep focused on accomplishing daily goals.  Patient is to remove himself as soon as he feels emotions surfacing in his body. Long-term goal: Enhance ability to handle effectively the full variety of life's anxieties Short-term goal: Identify the major life complex in the past and present the form the basis for present anxiety  Stevphen Meuse, [facility]

## 2020-07-18 ENCOUNTER — Other Ambulatory Visit: Payer: Self-pay

## 2020-07-18 ENCOUNTER — Ambulatory Visit (INDEPENDENT_AMBULATORY_CARE_PROVIDER_SITE_OTHER): Payer: BC Managed Care – PPO | Admitting: Psychiatry

## 2020-07-18 DIAGNOSIS — F319 Bipolar disorder, unspecified: Secondary | ICD-10-CM | POA: Diagnosis not present

## 2020-07-18 NOTE — Progress Notes (Signed)
      [facility] Counselor/Therapist Progress Note  Patient ID: Jordan Christian, MRN: [id number],    Date: 07/18/2020  Time Spent: 50 minutes start time 5:07 PM end time 5:57 PM  Treatment Type: Individual Therapy  Reported Symptoms: mood swings, anxiety, irritability, sadness, sleep issues  Mental Status Exam:  Appearance:   Casual     Behavior:  Appropriate  Motor:  Normal  Speech/Language:   Normal Rate  Affect:  Appropriate  Mood:  anxious  Thought process:  normal  Thought content:    WNL  Sensory/Perceptual disturbances:    WNL  Orientation:  oriented to person, place, time/date and situation  Attention:  Good  Concentration:  Good  Memory:  WNL  Fund of knowledge:   Good  Insight:    Good  Judgment:   Good  Impulse Control:  Good   Risk Assessment: Danger to Self:  No Self-injurious Behavior: No Danger to Others: No Duty to Warn:no Physical Aggression / Violence:No  Access to Firearms a concern: No  Gang Involvement:No   Subjective: Patient was present for session.  He shared that he is working again which is good.  He shared that he is having lots of mood swings and sleep issues.  Discussed the fact that his fiance family is angry with him and haven't talked with him since the incident with him.  Encouraged him to realize that is probably why he is struggling.  Patient did EMDR set on them not communicating with him, suds level 7, negative cognition "I am a problem" felt anxiety in his chest.  Patient was able to reduce suds level to 3.  Discussed the importance of working on his self talk to keep himself at a good place for his wedding.  Also discussed different ways that he can try and approach the issue prior to the wedding so that he can feel a sense of relief for at least have a plan on how to handle the situation if they are still not talking to him at the wedding.  Patient was able to come up with some plans that he felt positive about and agreed to follow  through on.  Interventions: Solution-Oriented/Positive Psychology and Eye Movement Desensitization and Reprocessing (EMDR)  Diagnosis:   ICD-10-CM   1. Bipolar I disorder (HCC)  F31.9     Plan: Patient is to use coping skills to decrease anxiety symptoms.  Patient is to follow plans from session to manage the situation at the wedding with his fianc's family appropriately.  Patient is to continue working and exercising to release anxiety appropriately. Long-term goal: Enhance ability to handle effectively the full variety of life's anxieties Short-term goal: Identify the major life complex in the past and present the form the basis for present anxiety  Stevphen Meuse, [facility]

## 2020-08-01 ENCOUNTER — Ambulatory Visit: Payer: BC Managed Care – PPO | Admitting: Psychiatry

## 2020-08-08 ENCOUNTER — Other Ambulatory Visit: Payer: Self-pay

## 2020-08-08 ENCOUNTER — Encounter: Payer: Self-pay | Admitting: Adult Health

## 2020-08-08 ENCOUNTER — Ambulatory Visit (INDEPENDENT_AMBULATORY_CARE_PROVIDER_SITE_OTHER): Payer: BC Managed Care – PPO | Admitting: Adult Health

## 2020-08-08 VITALS — BP 138/83 | HR 74

## 2020-08-08 DIAGNOSIS — F411 Generalized anxiety disorder: Secondary | ICD-10-CM

## 2020-08-08 DIAGNOSIS — F909 Attention-deficit hyperactivity disorder, unspecified type: Secondary | ICD-10-CM | POA: Diagnosis not present

## 2020-08-08 DIAGNOSIS — F319 Bipolar disorder, unspecified: Secondary | ICD-10-CM

## 2020-08-08 DIAGNOSIS — G4700 Insomnia, unspecified: Secondary | ICD-10-CM | POA: Diagnosis not present

## 2020-08-08 MED ORDER — METHYLPHENIDATE HCL ER 36 MG PO TB24: 36.0000 mg | ORAL_TABLET | Freq: Every day | ORAL | 0 refills | Status: DC

## 2020-08-08 NOTE — Progress Notes (Signed)
Jordan Christian [id number] 03-Jul-1996 24 y.o.  Subjective:   Patient ID:  Jordan Christian is a 24 y.o. (DOB 1996/07/06) male.  Chief Complaint: No chief complaint on file.   HPI Jordan Christian presents to the office today for follow-up of MDD, GAD, ADHD, insomnia.  Describes mood today as "better". Pleasant. Mood symptoms - reports depression, anxiety and irritability - "it comes and goes". Has days where he is in a "funk". Having equal amount of good and bad days. Feels like increase in Risperdal was helpful. Recently married. Went to [employer] for her honeymoon. Denies panic attacks - Has periods of his heart beating fast". Mood swings are better, "very rare". Having 2 to 3 a week versus 2 or 3 a day. Does not feel as angry.  Working with therapist. Stable interest and motivation. Taking Risperdal as prescribed. Energy levels "normal" for him. Has days that are more productive than others. Does not have a regular exercise routine.  Enjoys some usual interests and activities. Recently married. Lives with wife, 4 cats and 1 dog. Wife's parents live in [place]. He lives close to family - grandparents 2 doors down. Spending time with family. Appetite decreased. Weight stable - 167 pounds. Sleeps well most nights. Averages 7 hours.  Focus and concentration difficulties. Previously diagnosed with ADHD in childhood. Has taken Ritalin and Concerta to manage symptoms in the past. Would like to restart Concerta. Completing tasks. Managing aspects of household. Works full-time. Denies SI or HI.  Denies AH or VH.  Previous medication trials: Celexa, Remeron, Concerta, Channing Mutters, Wellbutrin    PHQ2-9     Office Visit from 03/12/2017 in Primary Care at [facility] Total Score 0       Review of Systems:  Review of Systems  Musculoskeletal: Negative for gait problem.  Neurological: Negative for tremors.  Psychiatric/Behavioral:       Please refer to HPI    Medications: I have reviewed  the patient's current medications.  Current Outpatient Medications  Medication Sig Dispense Refill  . benzonatate (TESSALON) 100 MG capsule Take 1 capsule (100 mg total) by mouth every 8 (eight) hours. 30 capsule 0  . cetirizine (ZYRTEC ALLERGY) 10 MG tablet Take 1 tablet (10 mg total) by mouth daily. 30 tablet 0  . fluticasone (FLONASE) 50 MCG/ACT nasal spray Place 1 spray into both nostrils daily for 14 days. 16 g 0  . glycopyrrolate (ROBINUL) 1 MG tablet Take 1 tablet (1 mg total) by mouth 2 (two) times daily. 60 tablet 6  . loratadine (CLARITIN) 10 MG tablet Take 10 mg by mouth daily.    . methylphenidate 36 MG PO CR tablet Take 1 tablet (36 mg total) by mouth daily. 30 tablet 0  . Multiple Vitamin (MULTIVITAMIN WITH MINERALS) TABS tablet Take 1 tablet by mouth daily.    Marland Kitchen omeprazole (PRILOSEC) 40 MG capsule TAKE 1 CAPSULE BY MOUTH ONCE DAILY IN THE MORNING 30 capsule 1  . predniSONE (DELTASONE) 10 MG tablet Take 2 tablets (20 mg total) by mouth daily. 15 tablet 0  . risperiDONE (RISPERDAL) 2 MG tablet Take 1/2 tablet at bedtime for 7 nights, then one tablet at bedtime. 30 tablet 2   No current facility-administered medications for this visit.    Medication Side Effects: None  Allergies: No Known Allergies  Past Medical History:  Diagnosis Date  . ADHD   . Depression   . GERD (gastroesophageal reflux disease)     Family History  Adopted:  Yes  Problem Relation Age of Onset  . Bipolar disorder Mother   . Bipolar disorder Sister   . Alcohol abuse Father     Social History   Socioeconomic History  . Marital status: Single    Spouse name: Not on file  . Number of children: Not on file  . Years of education: Not on file  . Highest education level: Not on file  Occupational History  . Occupation: Personnel officer  Tobacco Use  . Smoking status: Former Smoker    Years: 3.50    Types: Cigarettes, E-cigarettes  . Smokeless tobacco: Never Used  Vaping Use  . Vaping Use: Every  day  Substance and Sexual Activity  . Alcohol use: No    Alcohol/week: 1.0 - 2.0 standard drink    Types: 1 - 2 Cans of beer per week    Comment: occ  . Drug use: Yes    Types: Marijuana    Comment: occ  . Sexual activity: Yes    Partners: Female    Birth control/protection: None  Other Topics Concern  . Not on file  Social History Narrative  . Not on file   Social Determinants of Health   Financial Resource Strain:   . Difficulty of Paying Living Expenses: Not on file  Food Insecurity:   . Worried About Programme researcher, broadcasting/film/video in the Last Year: Not on file  . Ran Out of Food in the Last Year: Not on file  Transportation Needs:   . Lack of Transportation (Medical): Not on file  . Lack of Transportation (Non-Medical): Not on file  Physical Activity:   . Days of Exercise per Week: Not on file  . Minutes of Exercise per Session: Not on file  Stress:   . Feeling of Stress : Not on file  Social Connections:   . Frequency of Communication with Friends and Family: Not on file  . Frequency of Social Gatherings with Friends and Family: Not on file  . Attends Religious Services: Not on file  . Active Member of Clubs or Organizations: Not on file  . Attends Banker Meetings: Not on file  . Marital Status: Not on file  Intimate Partner Violence:   . Fear of Current or Ex-Partner: Not on file  . Emotionally Abused: Not on file  . Physically Abused: Not on file  . Sexually Abused: Not on file    Past Medical History, Surgical history, Social history, and Family history were reviewed and updated as appropriate.   Please see review of systems for further details on the patient's review from today.   Objective:   Physical Exam:  BP 138/83   Pulse 74   Physical Exam Constitutional:      General: He is not in acute distress. Musculoskeletal:        General: No deformity.  Neurological:     Mental Status: He is alert and oriented to person, place, and time.      Coordination: Coordination normal.  Psychiatric:        Attention and Perception: Attention and perception normal. He does not perceive auditory or visual hallucinations.        Mood and Affect: Mood normal. Mood is not anxious or depressed. Affect is not labile, blunt, angry or inappropriate.        Speech: Speech normal.        Behavior: Behavior normal.        Thought Content: Thought content normal. Thought content is  not paranoid or delusional. Thought content does not include homicidal or suicidal ideation. Thought content does not include homicidal or suicidal plan.        Cognition and Memory: Cognition and memory normal.        Judgment: Judgment normal.     Comments: Insight intact     Lab Review:     Component Value Date/Time   NA 141 03/14/2017 1350   K 5.3 (H) 03/14/2017 1350   CL 101 03/14/2017 1350   CO2 26 03/14/2017 1350   GLUCOSE 92 03/14/2017 1350   GLUCOSE 149 (H) 11/30/2010 2143   BUN 7 03/14/2017 1350   CREATININE 0.92 03/14/2017 1350   CALCIUM 9.3 03/14/2017 1350   PROT 7.0 03/14/2017 1350   ALBUMIN 4.7 03/14/2017 1350   AST 15 03/14/2017 1350   ALT 12 03/14/2017 1350   ALKPHOS 68 03/14/2017 1350   BILITOT 0.2 03/14/2017 1350   GFRNONAA 118 03/14/2017 1350   GFRAA 137 03/14/2017 1350       Component Value Date/Time   WBC 5.2 07/06/2017 1615   WBC 5.6 11/30/2010 2143   RBC 4.56 07/06/2017 1615   RBC 4.87 11/30/2010 2143   HGB 13.8 07/06/2017 1615   HCT 40.3 07/06/2017 1615   PLT 224 07/06/2017 1615   MCV 88 07/06/2017 1615   MCH 30.3 07/06/2017 1615   MCH 29.4 11/30/2010 2143   MCHC 34.2 07/06/2017 1615   MCHC 33.8 11/30/2010 2143   RDW 13.5 07/06/2017 1615   LYMPHSABS 1.8 07/06/2017 1615   MONOABS 0.4 11/30/2010 2143   EOSABS 0.2 07/06/2017 1615   BASOSABS 0.0 07/06/2017 1615    No results found for: POCLITH, LITHIUM   No results found for: PHENYTOIN, PHENOBARB, VALPROATE, CBMZ   .res Assessment: Plan:    Plan:  PDMP  reviewed  1. Risperdal 2mg  - one tablet daily.  2. Add Concerta 36mg  every morning.  Discussed discontinuation of marijuana and patient plans to cease use. Is willing to submit to regular drug testing as well. Feels like his lack of motivation and interest to get things done is more important. Previously did well on Concerta 36mg  daily.   RTC 4 weeks  Patient advised to contact office with any questions, adverse effects, or acute worsening in signs and symptoms.  Discussed potential metabolic side effects associated with atypical antipsychotics, as well as potential risk for movement side effects. Advised pt to contact office if movement side effects occur.    Diagnoses and all orders for this visit:  Attention deficit hyperactivity disorder (ADHD), unspecified ADHD type -     methylphenidate 36 MG PO CR tablet; Take 1 tablet (36 mg total) by mouth daily.  Bipolar I disorder (HCC)  Generalized anxiety disorder  Insomnia, unspecified type     Please see After Visit Summary for patient specific instructions.  Future Appointments  Date Time Provider Department Center  08/21/2020  4:00 PM , [facility] CP-CP None  09/05/2020  5:00 PM Stevphen Meuse, [facility] CP-CP None  09/06/2020  4:20 PM Sarah-Jane Nazario, Stevphen Meuse, NP CP-CP None    No orders of the defined types were placed in this encounter.   -------------------------------

## 2020-08-21 ENCOUNTER — Other Ambulatory Visit: Payer: Self-pay

## 2020-08-21 ENCOUNTER — Ambulatory Visit (INDEPENDENT_AMBULATORY_CARE_PROVIDER_SITE_OTHER): Payer: BC Managed Care – PPO | Admitting: Psychiatry

## 2020-08-21 DIAGNOSIS — F319 Bipolar disorder, unspecified: Secondary | ICD-10-CM | POA: Diagnosis not present

## 2020-08-21 NOTE — Progress Notes (Signed)
[facility] Counselor/Therapist Progress Note  Patient ID: RAYNOLD BLANKENBAKER, MRN: [id number],    Date: 08/21/2020  Time Spent: 48 minutes start time 4:08 PM end time 4:56 PM  Treatment Type: Individual Therapy  Reported Symptoms: fatigue, sleep issues, panic, anxiety  Mental Status Exam:  Appearance:   Casual     Behavior:  Appropriate  Motor:  Normal  Speech/Language:   Normal Rate  Affect:  Appropriate  Mood:  anxious  Thought process:  normal  Thought content:    WNL  Sensory/Perceptual disturbances:    WNL  Orientation:  oriented to person, place, time/date and situation  Attention:  Good  Concentration:  Good  Memory:  WNL  Fund of knowledge:   Good  Insight:    Good  Judgment:   Good  Impulse Control:  Good   Risk Assessment: Danger to Self:  No Self-injurious Behavior: No Danger to Others: No Duty to Warn:no Physical Aggression / Violence:No  Access to Firearms a concern: No  Gang Involvement:No   Subjective: Patient was present for session. He shared that his wedding went well and he was happy and had utilized plans from session that were helpful.  He was able to spend time with his wife's family.  He stated he is working still and that it good.  He went on to share that he is struggling with anxiety and he reported that he concerned about his dad not being around much longer.  Patient shared he is stopping smoking and he can tell it has impacted his mood.  Patient was encouraged to think through positive things that he can do to improve his mood especially while he is working on stopping smoking.  Patient was able to recognize that when he and his wife did go for a walk in the evenings that is helpful.  Also when he keeps his brain more engaged in activity that seems to help as well.  Was encouraged to practice both of those things and to talk to his wife about getting her support in the journey.  Patient went on to share the reasons he is concerned about his  father and his health and why that is so important to him.  Patient was encouraged to stay present and focus on the things that he can control fix and change and to recognize that all he can do is enjoy the time he has with his father currently.  Different CBT skills to help him stay more grounded and present were discussed with patient.  Patient agreed to follow through with plans from session.  Agreed to continue processing through EMDR at future sessions but not when he is going through stopping smoking.  Interventions: Cognitive Behavioral Therapy and Solution-Oriented/Positive Psychology  Diagnosis:   ICD-10-CM   1. Bipolar I disorder (HCC)  F31.9     Plan: Patient is to use CBT and coping skills to decrease anxiety symptoms.  Patient is to work on continuing stopping smoking.  Patient is to engage in physical activity to release anxiety appropriately.  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 Long-term goal: Enhance ability to handle effectively the full variety of life's anxieties Short-term goal: Identify the major life complex from the past and present in that form the basis for present anxiety  Stevphen Meuse, [facility]

## 2020-09-05 ENCOUNTER — Other Ambulatory Visit: Payer: Self-pay

## 2020-09-05 ENCOUNTER — Ambulatory Visit (INDEPENDENT_AMBULATORY_CARE_PROVIDER_SITE_OTHER): Payer: BC Managed Care – PPO | Admitting: Psychiatry

## 2020-09-05 DIAGNOSIS — F319 Bipolar disorder, unspecified: Secondary | ICD-10-CM

## 2020-09-05 NOTE — Progress Notes (Signed)
      [facility] Counselor/Therapist Progress Note  Patient ID: ELZA SORTOR, MRN: [id number],    Date: 09/05/2020  Time Spent: 51 minutes start time 5:01 PM end time 5:52 PM  Treatment Type: Individual Therapy  Reported Symptoms: fatigue, anxiety, mood swings, irritability, sadness  Mental Status Exam:  Appearance:   Casual     Behavior:  Appropriate  Motor:  Normal  Speech/Language:   Normal Rate  Affect:  Appropriate  Mood:  irritable  Thought process:  normal  Thought content:    WNL  Sensory/Perceptual disturbances:    WNL  Orientation:  oriented to person, place, time/date and situation  Attention:  Good  Concentration:  Good  Memory:  WNL  Fund of knowledge:   Good  Insight:    Good  Judgment:   Good  Impulse Control:  Good   Risk Assessment: Danger to Self:  No Self-injurious Behavior: No Danger to Others: No Duty to Warn:no Physical Aggression / Violence:No  Access to Firearms a concern: No  Gang Involvement:No   Subjective: Patient was present for session.  He explained he had  Bad Christmas Eve because he over reacted and yelled at his wife.  Discussed the situation that occurred that morning.  Patient reported the worst part of it was him yelling at his wife he did EMDR set on that issue, suds level 9, negative cognition "I am a bully" felt anger and resentment all over.  Patient was only able to reduce suds level to 7.  He was able to recognize through the processing that the biggest part of the issue was lack of communication between the 2 of them.  Discussed ways that he and his wife can communicate more about expectations rather than feeling frustrated when their expectations are met.  Encourage patient to recognize when his body is starting to show his agitation and work on asking himself some questions at that time to get grounded.  Discussed the importance of being aware of triggers and being able to get himself grounded and then communicate what he is  feeling.  Patient was able to recognize that he lets his emotions run him too much.  Discussed the importance of staying grounded and focusing on the truth/facts rather than his feelings.  Interventions: Cognitive Behavioral Therapy, Solution-Oriented/Positive Psychology and Eye Movement Desensitization and Reprocessing (EMDR)  Diagnosis:   ICD-10-CM   1. Bipolar I disorder ([place])  F31.9     Plan: Patient is to use CBT and coping skills to decrease anxiety symptoms.  Patient is to work on recognizing when his body is showing agitation and getting himself grounded at that time.  Patient is to try and stay focused on the facts/feelings rather than his emotions.  Patient is to try and communicate expectations with his wife regularly to try to decrease their conflicts. Long-term goal: Enhance ability to handle effectively the full variety of life's anxieties Short-term goal: Identify the major, life conflicts from the past and present in the form the basis for present anxiety  Lina Sayre, [facility]

## 2020-09-06 ENCOUNTER — Ambulatory Visit (INDEPENDENT_AMBULATORY_CARE_PROVIDER_SITE_OTHER): Payer: BC Managed Care – PPO | Admitting: Adult Health

## 2020-09-06 ENCOUNTER — Encounter: Payer: Self-pay | Admitting: Adult Health

## 2020-09-06 DIAGNOSIS — F909 Attention-deficit hyperactivity disorder, unspecified type: Secondary | ICD-10-CM

## 2020-09-06 DIAGNOSIS — F411 Generalized anxiety disorder: Secondary | ICD-10-CM

## 2020-09-06 DIAGNOSIS — G4700 Insomnia, unspecified: Secondary | ICD-10-CM

## 2020-09-06 DIAGNOSIS — F319 Bipolar disorder, unspecified: Secondary | ICD-10-CM | POA: Diagnosis not present

## 2020-09-06 MED ORDER — METHYLPHENIDATE HCL ER 36 MG PO TB24: 36.0000 mg | ORAL_TABLET | Freq: Every day | ORAL | 0 refills | Status: DC

## 2020-09-06 NOTE — Progress Notes (Signed)
Jordan Christian [id number] [birth year] 24 y.o.  Subjective:   Patient ID:  Jordan Christian is a 24 y.o. (DOB 1996-04-21) male.  Chief Complaint: No chief complaint on file.   HPI Jordan Christian presents to the office today for follow-up of MDD, GAD, ADHD, insomnia.  Describes mood today as "ok". Pleasant. Mood symptoms - reports decreased depression, anxiety and irritability. Denies any recent panic attacks. Stating "I am doing better". Not as angry - "it comes and goes". Has some "off" days, but is able to pull himself out of it. Feels like Risperdal is helpful. More motivated and getting things done with the addition of Concerta. Working with therapist. Stable interest and motivation. Taking Risperdal as prescribed. Energy levels stable.Does not have a regular exercise routine.  Enjoys some usual interests and activities. Recently married. Lives with wife, 4 cats and 1 dog. Wife's parents live in [place]. He lives close to family - grandparents 2 doors down. Spending time with family. Appetite stable. Weight stable - 165 pounds. Sleeps well most nights. Averages 7 hours of broken sleep. Waking up 2 to 3 times a nights.  Focus and concentration improved. Concerta working well. Completing tasks. Managing aspects of household. Works full-time. Denies SI or HI.  Denies AH or VH.  Previous medication trials: Celexa, Remeron, Concerta, Intuniv, Stratera, Wellbutrin   PHQ2-9   Flowsheet Row Office Visit from 03/12/2017 in Primary Care at [facility] Total Score 0       Review of Systems:  Review of Systems  Musculoskeletal: Negative for gait problem.  Neurological: Negative for tremors.  Psychiatric/Behavioral:       Please refer to HPI    Medications: I have reviewed the patient's current medications.  Current Outpatient Medications  Medication Sig Dispense Refill   benzonatate (TESSALON) 100 MG capsule Take 1 capsule (100 mg total) by mouth every 8 (eight) hours. 30 capsule 0    cetirizine (ZYRTEC ALLERGY) 10 MG tablet Take 1 tablet (10 mg total) by mouth daily. 30 tablet 0   fluticasone (FLONASE) 50 MCG/ACT nasal spray Place 1 spray into both nostrils daily for 14 days. 16 g 0   glycopyrrolate (ROBINUL) 1 MG tablet Take 1 tablet (1 mg total) by mouth 2 (two) times daily. 60 tablet 6   loratadine (CLARITIN) 10 MG tablet Take 10 mg by mouth daily.     methylphenidate 36 MG PO CR tablet Take 1 tablet (36 mg total) by mouth daily. 30 tablet 0   Multiple Vitamin (MULTIVITAMIN WITH MINERALS) TABS tablet Take 1 tablet by mouth daily.     omeprazole (PRILOSEC) 40 MG capsule TAKE 1 CAPSULE BY MOUTH ONCE DAILY IN THE MORNING 30 capsule 1   predniSONE (DELTASONE) 10 MG tablet Take 2 tablets (20 mg total) by mouth daily. 15 tablet 0   risperiDONE (RISPERDAL) 2 MG tablet Take 1/2 tablet at bedtime for 7 nights, then one tablet at bedtime. 30 tablet 2   No current facility-administered medications for this visit.    Medication Side Effects: None  Allergies: No Known Allergies  Past Medical History:  Diagnosis Date   ADHD    Depression    GERD (gastroesophageal reflux disease)     Family History  Adopted: Yes  Problem Relation Age of Onset   Bipolar disorder Mother    Bipolar disorder Sister    Alcohol abuse Father     Social History   Socioeconomic History   Marital status: Single    Spouse  name: Not on file   Number of children: Not on file   Years of education: Not on file   Highest education level: Not on file  Occupational History   Occupation: Personnel officer  Tobacco Use   Smoking status: Former Smoker    Years: 3.50    Types: Cigarettes, E-cigarettes   Smokeless tobacco: Never Used  Building services engineer Use: Every day  Substance and Sexual Activity   Alcohol use: No    Alcohol/week: 1.0 - 2.0 standard drink    Types: 1 - 2 Cans of beer per week    Comment: occ   Drug use: Yes    Types: Marijuana    Comment: occ    Sexual activity: Yes    Partners: Female    Birth control/protection: None  Other Topics Concern   Not on file  Social History Narrative   Not on file   Social Determinants of Health   Financial Resource Strain: Not on file  Food Insecurity: Not on file  Transportation Needs: Not on file  Physical Activity: Not on file  Stress: Not on file  Social Connections: Not on file  Intimate Partner Violence: Not on file    Past Medical History, Surgical history, Social history, and Family history were reviewed and updated as appropriate.   Please see review of systems for further details on the patient's review from today.   Objective:   Physical Exam:  There were no vitals taken for this visit.  Physical Exam Constitutional:      General: He is not in acute distress. Musculoskeletal:        General: No deformity.  Neurological:     Mental Status: He is alert and oriented to person, place, and time.     Coordination: Coordination normal.  Psychiatric:        Attention and Perception: Attention and perception normal. He does not perceive auditory or visual hallucinations.        Mood and Affect: Mood normal. Mood is not anxious or depressed. Affect is not labile, blunt, angry or inappropriate.        Speech: Speech normal.        Behavior: Behavior normal.        Thought Content: Thought content normal. Thought content is not paranoid or delusional. Thought content does not include homicidal or suicidal ideation. Thought content does not include homicidal or suicidal plan.        Cognition and Memory: Cognition and memory normal.        Judgment: Judgment normal.     Comments: Insight intact     Lab Review:     Component Value Date/Time   NA 141 03/14/2017 1350   K 5.3 (H) 03/14/2017 1350   CL 101 03/14/2017 1350   CO2 26 03/14/2017 1350   GLUCOSE 92 03/14/2017 1350   GLUCOSE 149 (H) 11/30/2010 2143   BUN 7 03/14/2017 1350   CREATININE 0.92 03/14/2017 1350   CALCIUM  9.3 03/14/2017 1350   PROT 7.0 03/14/2017 1350   ALBUMIN 4.7 03/14/2017 1350   AST 15 03/14/2017 1350   ALT 12 03/14/2017 1350   ALKPHOS 68 03/14/2017 1350   BILITOT 0.2 03/14/2017 1350   GFRNONAA 118 03/14/2017 1350   GFRAA 137 03/14/2017 1350       Component Value Date/Time   WBC 5.2 07/06/2017 1615   WBC 5.6 11/30/2010 2143   RBC 4.56 07/06/2017 1615   RBC 4.87 11/30/2010 2143  HGB 13.8 07/06/2017 1615   HCT 40.3 07/06/2017 1615   PLT 224 07/06/2017 1615   MCV 88 07/06/2017 1615   MCH 30.3 07/06/2017 1615   MCH 29.4 11/30/2010 2143   MCHC 34.2 07/06/2017 1615   MCHC 33.8 11/30/2010 2143   RDW 13.5 07/06/2017 1615   LYMPHSABS 1.8 07/06/2017 1615   MONOABS 0.4 11/30/2010 2143   EOSABS 0.2 07/06/2017 1615   BASOSABS 0.0 07/06/2017 1615    No results found for: POCLITH, LITHIUM   No results found for: PHENYTOIN, PHENOBARB, VALPROATE, CBMZ   .res Assessment: Plan:    Plan:  PDMP reviewed  1. Risperdal 2mg  - one tablet daily.  2. Concerta 36mg  every morning.  Discussed discontinuation of marijuana and patient plans to cease use. Is willing to submit to regular drug testing as well. Feels like his lack of motivation and interest to get things done is more important. Previously did well on Concerta 36mg  daily.  RTC 4 weeks  Patient advised to contact office with any questions, adverse effects, or acute worsening in signs and symptoms.  Discussed potential metabolic side effects associated with atypical antipsychotics, as well as potential risk for movement side effects. Advised pt to contact office if movement side effects occur.     Diagnoses and all orders for this visit:  Bipolar I disorder (HCC)  Attention deficit hyperactivity disorder (ADHD), unspecified ADHD type -     methylphenidate 36 MG PO CR tablet; Take 1 tablet (36 mg total) by mouth daily.  Generalized anxiety disorder  Insomnia, unspecified type     Please see After Visit Summary for  patient specific instructions.  Future Appointments  Date Time Provider Department Center  09/27/2020  4:00 PM , [facility] CP-CP None    No orders of the defined types were placed in this encounter.   -------------------------------

## 2020-09-27 ENCOUNTER — Telehealth: Payer: Self-pay | Admitting: Psychiatry

## 2020-09-27 ENCOUNTER — Ambulatory Visit (INDEPENDENT_AMBULATORY_CARE_PROVIDER_SITE_OTHER): Payer: BC Managed Care – PPO | Admitting: Psychiatry

## 2020-09-27 DIAGNOSIS — F319 Bipolar disorder, unspecified: Secondary | ICD-10-CM | POA: Diagnosis not present

## 2020-09-27 NOTE — Telephone Encounter (Incomplete)
Mr. theordore, cisnero are scheduled for a virtual visit with your provider today.    Just as we do with appointments in the office, we must obtain your consent to participate.  Your consent will be active for this visit and any virtual visit you may have with one of our providers in the next 365 days.    If you have a MyChart account, I can also send a copy of this consent to you electronically.  All virtual visits are billed to your insurance company just like a traditional visit in the office.  As this is a virtual visit, video technology does not allow for your provider to perform a traditional examination.  This may limit your provider's ability to fully assess your condition.  If your provider identifies any concerns that need to be evaluated in person or the need to arrange testing such as labs, EKG, etc, we will make arrangements to do so.    Although advances in technology are sophisticated, we cannot ensure that it will always work on either your end or our end.  If the connection with a video visit is poor, we may have to switch to a telephone visit.  With either a video or telephone visit, we are not always able to ensure that we have a secure connection.   I need to obtain your verbal consent now.   Are you willing to proceed with your visit today?   Marshel Golubski Majeed has provided verbal consent on 09/27/2020 for a virtual visit (video or telephone).   Stevphen Meuse, [facility] 09/27/2020  4:05 PM

## 2020-09-27 NOTE — Progress Notes (Signed)
[facility] Counselor/Therapist Progress Note  Patient ID: Jordan Christian, MRN: [id number],    Date: 09/27/2020  Time Spent: 46 minutes start time 4:05 PM end time 4:51 PM Virtual Visit via Telephone Note Connected with patient by a video enabled telemedicine/telehealth application, with their informed consent, and verified patient privacy and that I am speaking with the correct person using two identifiers. I discussed the limitations, risks, security and privacy concerns of performing psychotherapy and management service by telephone and the availability of in person appointments. I also discussed with the patient that there may be a patient responsible charge related to this service. The patient expressed understanding and agreed to proceed. I discussed the treatment planning with the patient. The patient was provided an opportunity to ask questions and all were answered. The patient agreed with the plan and demonstrated an understanding of the instructions. The patient was advised to call  our office if  symptoms worsen or feel they are in a crisis state and need immediate contact.   Therapist Location: office Patient Location: home    Treatment Type: Individual Therapy  Reported Symptoms: low motivation, sadness, anxiety, frustration, irritability, mood swings, sleep  issue  Mental Status Exam:  Appearance:   Casual     Behavior:  Appropriate  Motor:  Normal  Speech/Language:   Normal Rate  Affect:  Appropriate  Mood:  sad  Thought process:  normal  Thought content:    WNL  Sensory/Perceptual disturbances:    WNL  Orientation:  oriented to person, place, time/date and situation  Attention:  Good  Concentration:  Good  Memory:  WNL  Fund of knowledge:   Good  Insight:    Good  Judgment:   Good  Impulse Control:  Good   Risk Assessment: Danger to Self:  No Self-injurious Behavior: No Danger to Others: No Duty to Warn:no Physical Aggression / Violence:No  Access  to Firearms a concern: No  Gang Involvement:No   Subjective: Met with patient via virtual session. He reported that he had gotten laid off and that was hard for him.  He shared that it put him in a tail spin and he lost his motivation.  He shared that after that he got depressed.  The picture loosing work- SUDS level 10 "I am not doing something right" felt sadness, anger, anxiety in his shoulders and back. Patient  Was able to reduce SUDS level to 3.  Patient was able to recognize that there are a lot of things for him that are attached his work loss that he cannot control but there are some things he can do something about.  He was able to recognize the importance of self talk and keeping focused on what he can do.  Patient agreed to make a list of all the things that he can do around his home if he is not working.  Patient shared currently he is on a job that should be good for a few months but with the weather and COVID there are no guarantees.  Developed a plan to manage it if he were to lose his temporary job again.    Interventions: Solution-Oriented/Positive Psychology and Eye Movement Desensitization and Reprocessing (EMDR)  Diagnosis:   ICD-10-CM   1. Bipolar I disorder ([place])  F31.9     Plan: Patient is to use CBT and coping skills to decrease anxiety symptoms.  Patient is to work on finding physical releases for his emotions.  Patient is to write  a list out of all the things that he can do if he is not working to look at and pick things from to stay focused on what he wants to accomplish.  Patient is to consult with his provider at next session about his sleep issues Long-term goal: Enhance ability to handle effectively the full variety of life's anxieties Short-term goal: Identify major life conflicts from the past and present that form the basis for present anxiety  Lina Sayre, [facility]

## 2020-10-04 ENCOUNTER — Ambulatory Visit: Payer: BC Managed Care – PPO | Admitting: Adult Health

## 2020-10-05 ENCOUNTER — Ambulatory Visit (INDEPENDENT_AMBULATORY_CARE_PROVIDER_SITE_OTHER): Payer: BC Managed Care – PPO | Admitting: Adult Health

## 2020-10-05 ENCOUNTER — Encounter: Payer: Self-pay | Admitting: Adult Health

## 2020-10-05 ENCOUNTER — Other Ambulatory Visit: Payer: Self-pay

## 2020-10-05 DIAGNOSIS — F411 Generalized anxiety disorder: Secondary | ICD-10-CM | POA: Diagnosis not present

## 2020-10-05 DIAGNOSIS — F319 Bipolar disorder, unspecified: Secondary | ICD-10-CM

## 2020-10-05 DIAGNOSIS — F909 Attention-deficit hyperactivity disorder, unspecified type: Secondary | ICD-10-CM

## 2020-10-05 DIAGNOSIS — G4700 Insomnia, unspecified: Secondary | ICD-10-CM | POA: Diagnosis not present

## 2020-10-05 MED ORDER — ARIPIPRAZOLE 5 MG PO TABS: ORAL_TABLET | ORAL | 2 refills | Status: DC

## 2020-10-05 NOTE — Progress Notes (Signed)
[name] [id number] 12-15-1995 25 y.o.  Subjective:   Patient ID:  Jordan Christian is a 25 y.o. (DOB 05/01/1996) male.  Chief Complaint: No chief complaint on file.   HPI Blaiden Werth Christian presents to the office today for follow-up of MDD, GAD, ADHD, insomnia.  Describes mood today as "not very good". Pleasant. Mood symptoms - reports depression, anxiety and irritability. Denies panic attacks. More irritated and wanting to fly off the handle. Stating "I feel angry all the time". Has started a new job and it is going well. Stating "I'm not doing good". Denies THC use or alcohol use. Does not feel like medications are working as well as they were. Working with therapist. Stable interest and motivation. Taking medication as prescribed. Energy levels lower. Does not have a regular exercise routine.  Enjoys some usual interests and activities. Recently married. Lives with wife, 4 cats and 1 dog. Wife's parents live in [place]. He lives close to family - grandparents 2 doors down. Spending time with family. Appetite stable. Weight stable - 165 pounds. Sleeping difficulties. Broken sleep - up and down and every hour to hour and a half. Mind active while sleeping. Focus and concentration improved. Completing tasks. Managing aspects of household. Works full-time. Denies SI or HI.  Denies AH or VH.  Previous medication trials: Celexa, Remeron, Concerta, Intuniv, Stratera, Wellbutrin    PHQ2-9   Flowsheet Row Office Visit from 03/12/2017 in Primary Care at [facility] Total Score 0       Review of Systems:  Review of Systems  Musculoskeletal: Negative for gait problem.  Neurological: Negative for tremors.  Psychiatric/Behavioral:       Please refer to HPI    Medications: I have reviewed the patient's current medications.  Current Outpatient Medications  Medication Sig Dispense Refill  . ARIPiprazole (ABILIFY) 5 MG tablet Take 1/2 tablet daily x 7 days, then take one tablet daily.  30 tablet 2  . cetirizine (ZYRTEC ALLERGY) 10 MG tablet Take 1 tablet (10 mg total) by mouth daily. 30 tablet 0  . fluticasone (FLONASE) 50 MCG/ACT nasal spray Place 1 spray into both nostrils daily for 14 days. 16 g 0  . methylphenidate 36 MG PO CR tablet Take 1 tablet (36 mg total) by mouth daily. 30 tablet 0  . Multiple Vitamin (MULTIVITAMIN WITH MINERALS) TABS tablet Take 1 tablet by mouth daily.    Marland Kitchen omeprazole (PRILOSEC) 40 MG capsule TAKE 1 CAPSULE BY MOUTH ONCE DAILY IN THE MORNING 30 capsule 1  . risperiDONE (RISPERDAL) 2 MG tablet Take 1/2 tablet at bedtime for 7 nights, then one tablet at bedtime. 30 tablet 2   No current facility-administered medications for this visit.    Medication Side Effects: None  Allergies: No Known Allergies  Past Medical History:  Diagnosis Date  . ADHD   . Depression   . GERD (gastroesophageal reflux disease)     Family History  Adopted: Yes  Problem Relation Age of Onset  . Bipolar disorder Mother   . Bipolar disorder Sister   . Alcohol abuse Father     Social History   Socioeconomic History  . Marital status: Single    Spouse name: Not on file  . Number of children: Not on file  . Years of education: Not on file  . Highest education level: Not on file  Occupational History  . Occupation: Personnel officer  Tobacco Use  . Smoking status: Former Smoker    Years: 3.50  Types: Cigarettes, E-cigarettes  . Smokeless tobacco: Never Used  Vaping Use  . Vaping Use: Every day  Substance and Sexual Activity  . Alcohol use: No    Alcohol/week: 1.0 - 2.0 standard drink    Types: 1 - 2 Cans of beer per week    Comment: occ  . Drug use: Yes    Types: Marijuana    Comment: occ  . Sexual activity: Yes    Partners: Female    Birth control/protection: None  Other Topics Concern  . Not on file  Social History Narrative  . Not on file   Social Determinants of Health   Financial Resource Strain: Not on file  Food Insecurity: Not on file   Transportation Needs: Not on file  Physical Activity: Not on file  Stress: Not on file  Social Connections: Not on file  Intimate Partner Violence: Not on file    Past Medical History, Surgical history, Social history, and Family history were reviewed and updated as appropriate.   Please see review of systems for further details on the patient's review from today.   Objective:   Physical Exam:  There were no vitals taken for this visit.  Physical Exam Constitutional:      General: He is not in acute distress. Musculoskeletal:        General: No deformity.  Neurological:     Mental Status: He is alert and oriented to person, place, and time.     Coordination: Coordination normal.  Psychiatric:        Attention and Perception: Attention and perception normal. He does not perceive auditory or visual hallucinations.        Mood and Affect: Mood normal. Mood is not anxious or depressed. Affect is not labile, blunt, angry or inappropriate.        Speech: Speech normal.        Behavior: Behavior normal.        Thought Content: Thought content normal. Thought content is not paranoid or delusional. Thought content does not include homicidal or suicidal ideation. Thought content does not include homicidal or suicidal plan.        Cognition and Memory: Cognition and memory normal.        Judgment: Judgment normal.     Comments: Insight intact     Lab Review:     Component Value Date/Time   NA 141 03/14/2017 1350   K 5.3 (H) 03/14/2017 1350   CL 101 03/14/2017 1350   CO2 26 03/14/2017 1350   GLUCOSE 92 03/14/2017 1350   GLUCOSE 149 (H) 11/30/2010 2143   BUN 7 03/14/2017 1350   CREATININE 0.92 03/14/2017 1350   CALCIUM 9.3 03/14/2017 1350   PROT 7.0 03/14/2017 1350   ALBUMIN 4.7 03/14/2017 1350   AST 15 03/14/2017 1350   ALT 12 03/14/2017 1350   ALKPHOS 68 03/14/2017 1350   BILITOT 0.2 03/14/2017 1350   GFRNONAA 118 03/14/2017 1350   GFRAA 137 03/14/2017 1350        Component Value Date/Time   WBC 5.2 07/06/2017 1615   WBC 5.6 11/30/2010 2143   RBC 4.56 07/06/2017 1615   RBC 4.87 11/30/2010 2143   HGB 13.8 07/06/2017 1615   HCT 40.3 07/06/2017 1615   PLT 224 07/06/2017 1615   MCV 88 07/06/2017 1615   MCH 30.3 07/06/2017 1615   MCH 29.4 11/30/2010 2143   MCHC 34.2 07/06/2017 1615   MCHC 33.8 11/30/2010 2143   RDW 13.5 07/06/2017 1615  LYMPHSABS 1.8 07/06/2017 1615   MONOABS 0.4 11/30/2010 2143   EOSABS 0.2 07/06/2017 1615   BASOSABS 0.0 07/06/2017 1615    No results found for: POCLITH, LITHIUM   No results found for: PHENYTOIN, PHENOBARB, VALPROATE, CBMZ   .res Assessment: Plan:    Plan:  PDMP reviewed  1. Risperdal 2mg  - 1/2 tablet at bedtime x 7 days, then one tablet.  2. Concerta 36mg  every morning not taking regularly 3. Add Abilify 5mg  daily - 1/2 x 7, then one tab daily  Consider Seroquel for sleep  Consider Depakote   Discussed discontinuation of marijuana and patient plans to cease use. Is willing to submit to regular drug testing as well. Feels like his lack of motivation and interest to get things done is more important. Previously did well on Concerta 36mg  daily.  RTC 4 weeks  Patient advised to contact office with any questions, adverse effects, or acute worsening in signs and symptoms.  Discussed potential metabolic side effects associated with atypical antipsychotics, as well as potential risk for movement side effects. Advised pt to contact office if movement side effects occur.    Diagnoses and all orders for this visit:  Bipolar I disorder (HCC) -     ARIPiprazole (ABILIFY) 5 MG tablet; Take 1/2 tablet daily x 7 days, then take one tablet daily.  Attention deficit hyperactivity disorder (ADHD), unspecified ADHD type  Generalized anxiety disorder  Insomnia, unspecified type     Please see After Visit Summary for patient specific instructions.  No future appointments.  No orders of the defined  types were placed in this encounter.   -------------------------------

## 2020-10-26 ENCOUNTER — Telehealth: Payer: Self-pay | Admitting: Adult Health

## 2020-10-26 ENCOUNTER — Other Ambulatory Visit: Payer: Self-pay | Admitting: Adult Health

## 2020-10-26 DIAGNOSIS — F909 Attention-deficit hyperactivity disorder, unspecified type: Secondary | ICD-10-CM

## 2020-10-26 MED ORDER — METHYLPHENIDATE HCL ER 36 MG PO TB24: 36.0000 mg | ORAL_TABLET | Freq: Every day | ORAL | 0 refills | Status: DC

## 2020-10-26 NOTE — Telephone Encounter (Signed)
Next appt is 11/02/20. Requesting refill on Methylphenidate 36 mg called to [facility] [address] [place], [place], [phone].

## 2020-10-26 NOTE — Telephone Encounter (Signed)
Script sent  

## 2020-11-02 ENCOUNTER — Ambulatory Visit (INDEPENDENT_AMBULATORY_CARE_PROVIDER_SITE_OTHER): Payer: BC Managed Care – PPO | Admitting: Adult Health

## 2020-11-02 ENCOUNTER — Other Ambulatory Visit: Payer: Self-pay

## 2020-11-02 ENCOUNTER — Ambulatory Visit: Payer: BC Managed Care – PPO | Admitting: Adult Health

## 2020-11-02 ENCOUNTER — Encounter: Payer: Self-pay | Admitting: Adult Health

## 2020-11-02 DIAGNOSIS — F319 Bipolar disorder, unspecified: Secondary | ICD-10-CM | POA: Diagnosis not present

## 2020-11-02 DIAGNOSIS — F909 Attention-deficit hyperactivity disorder, unspecified type: Secondary | ICD-10-CM | POA: Diagnosis not present

## 2020-11-02 DIAGNOSIS — G4700 Insomnia, unspecified: Secondary | ICD-10-CM

## 2020-11-02 DIAGNOSIS — F411 Generalized anxiety disorder: Secondary | ICD-10-CM

## 2020-11-02 MED ORDER — ARIPIPRAZOLE 5 MG PO TABS: ORAL_TABLET | ORAL | 5 refills | Status: DC

## 2020-11-02 MED ORDER — MIRTAZAPINE 15 MG PO TABS: 15.0000 mg | ORAL_TABLET | Freq: Every day | ORAL | 5 refills | Status: DC

## 2020-11-02 MED ORDER — METHYLPHENIDATE HCL ER 36 MG PO TB24: 36.0000 mg | ORAL_TABLET | Freq: Every day | ORAL | 0 refills | Status: DC

## 2020-11-02 NOTE — Progress Notes (Signed)
Jordan Christian [id number] 1996-07-12 24 y.o.  Subjective:   Patient ID:  Jordan Christian is a 25 y.o. (DOB 27-Feb-1996) male.  Chief Complaint: No chief complaint on file.   HPI Jordan Christian presents to the office today for follow-up of MDD, GAD, ADHD, insomnia.  Describes mood today as "better". Pleasant. Mood symptoms - reports depression - "it comes in waves", anxiety - "not as much as it was" and irritability - "I have days of irritability, but trying to recognize it and get away from it". Denies panic attacks. Not "flying off the handle". Not feeling as "angry" all the time. Has started a new job and it is going "pretty well". Denies THC use or alcohol use. Feels like the Abilify has been helpful. Working with therapist. Stable interest and motivation. Taking medication as prescribed. Energy levels improved. Has a regular exercise routine.  Enjoys some usual interests and activities. Recently married. Lives with wife, 4 cats and 1 dog. Wife's parents live in [place]. He lives close to family - grandparents 2 doors down. Spending time with family. Appetite stable. Weight stable - 165 pounds. Sleeping difficulties - hit or miss. Waking up early some mornings. Having weird dreams. Struggles to get up and go to work some mornings.  Focus and concentration improved. Completing tasks. Managing aspects of household. Works full-time. Denies SI or HI.  Denies AH or VH.  Previous medication trials: Celexa, Remeron, Concerta, Intuniv, Stratera, Wellbutrin    PHQ2-9   Flowsheet Row Office Visit from 03/12/2017 in Primary Care at [facility] Total Score 0       Review of Systems:  Review of Systems  Musculoskeletal: Negative for gait problem.  Neurological: Negative for tremors.  Psychiatric/Behavioral:       Please refer to HPI    Medications: I have reviewed the patient's current medications.  Current Outpatient Medications  Medication Sig Dispense Refill  . mirtazapine  (REMERON) 15 MG tablet Take 1 tablet (15 mg total) by mouth at bedtime. 30 tablet 5  . ARIPiprazole (ABILIFY) 5 MG tablet Take one tablet daily. 30 tablet 5  . cetirizine (ZYRTEC ALLERGY) 10 MG tablet Take 1 tablet (10 mg total) by mouth daily. 30 tablet 0  . fluticasone (FLONASE) 50 MCG/ACT nasal spray Place 1 spray into both nostrils daily for 14 days. 16 g 0  . methylphenidate 36 MG PO CR tablet Take 1 tablet (36 mg total) by mouth daily. 30 tablet 0  . Multiple Vitamin (MULTIVITAMIN WITH MINERALS) TABS tablet Take 1 tablet by mouth daily.    Marland Kitchen omeprazole (PRILOSEC) 40 MG capsule TAKE 1 CAPSULE BY MOUTH ONCE DAILY IN THE MORNING 30 capsule 1   No current facility-administered medications for this visit.    Medication Side Effects: None  Allergies: No Known Allergies  Past Medical History:  Diagnosis Date  . ADHD   . Depression   . GERD (gastroesophageal reflux disease)     Family History  Adopted: Yes  Problem Relation Age of Onset  . Bipolar disorder Mother   . Bipolar disorder Sister   . Alcohol abuse Father     Social History   Socioeconomic History  . Marital status: Single    Spouse name: Not on file  . Number of children: Not on file  . Years of education: Not on file  . Highest education level: Not on file  Occupational History  . Occupation: Personnel officer  Tobacco Use  . Smoking status: Former Smoker  Years: 3.50    Types: Cigarettes, E-cigarettes  . Smokeless tobacco: Never Used  Vaping Use  . Vaping Use: Every day  Substance and Sexual Activity  . Alcohol use: No    Alcohol/week: 1.0 - 2.0 standard drink    Types: 1 - 2 Cans of beer per week    Comment: occ  . Drug use: Yes    Types: Marijuana    Comment: occ  . Sexual activity: Yes    Partners: Female    Birth control/protection: None  Other Topics Concern  . Not on file  Social History Narrative  . Not on file   Social Determinants of Health   Financial Resource Strain: Not on file   Food Insecurity: Not on file  Transportation Needs: Not on file  Physical Activity: Not on file  Stress: Not on file  Social Connections: Not on file  Intimate Partner Violence: Not on file    Past Medical History, Surgical history, Social history, and Family history were reviewed and updated as appropriate.   Please see review of systems for further details on the patient's review from today.   Objective:   Physical Exam:  There were no vitals taken for this visit.  Physical Exam Constitutional:      General: He is not in acute distress. Musculoskeletal:        General: No deformity.  Neurological:     Mental Status: He is alert and oriented to person, place, and time.     Coordination: Coordination normal.  Psychiatric:        Attention and Perception: Attention and perception normal. He does not perceive auditory or visual hallucinations.        Mood and Affect: Mood normal. Mood is not anxious or depressed. Affect is not labile, blunt, angry or inappropriate.        Speech: Speech normal.        Behavior: Behavior normal.        Thought Content: Thought content normal. Thought content is not paranoid or delusional. Thought content does not include homicidal or suicidal ideation. Thought content does not include homicidal or suicidal plan.        Cognition and Memory: Cognition and memory normal.        Judgment: Judgment normal.     Comments: Insight intact     Lab Review:     Component Value Date/Time   NA 141 03/14/2017 1350   K 5.3 (H) 03/14/2017 1350   CL 101 03/14/2017 1350   CO2 26 03/14/2017 1350   GLUCOSE 92 03/14/2017 1350   GLUCOSE 149 (H) 11/30/2010 2143   BUN 7 03/14/2017 1350   CREATININE 0.92 03/14/2017 1350   CALCIUM 9.3 03/14/2017 1350   PROT 7.0 03/14/2017 1350   ALBUMIN 4.7 03/14/2017 1350   AST 15 03/14/2017 1350   ALT 12 03/14/2017 1350   ALKPHOS 68 03/14/2017 1350   BILITOT 0.2 03/14/2017 1350   GFRNONAA 118 03/14/2017 1350   GFRAA  137 03/14/2017 1350       Component Value Date/Time   WBC 5.2 07/06/2017 1615   WBC 5.6 11/30/2010 2143   RBC 4.56 07/06/2017 1615   RBC 4.87 11/30/2010 2143   HGB 13.8 07/06/2017 1615   HCT 40.3 07/06/2017 1615   PLT 224 07/06/2017 1615   MCV 88 07/06/2017 1615   MCH 30.3 07/06/2017 1615   MCH 29.4 11/30/2010 2143   MCHC 34.2 07/06/2017 1615   MCHC 33.8 11/30/2010 2143  RDW 13.5 07/06/2017 1615   LYMPHSABS 1.8 07/06/2017 1615   MONOABS 0.4 11/30/2010 2143   EOSABS 0.2 07/06/2017 1615   BASOSABS 0.0 07/06/2017 1615    No results found for: POCLITH, LITHIUM   No results found for: PHENYTOIN, PHENOBARB, VALPROATE, CBMZ   .res Assessment: Plan:    Plan:  PDMP reviewed  1. Abilify 5mg  daily 2. Concerta 36mg  every morning 3. Add Remeron 15mg  at bedtime  Discussed discontinuation of marijuana and patient plans to cease use. Is willing to submit to regular drug testing as well. Feels like his lack of motivation and interest to get things done is more important. Previously did well on Concerta 36mg  daily.  RTC 4 weeks  Patient advised to contact office with any questions, adverse effects, or acute worsening in signs and symptoms.  Discussed potential metabolic side effects associated with atypical antipsychotics, as well as potential risk for movement side effects. Advised pt to contact office if movement side effects occur.    Diagnoses and all orders for this visit:  Attention deficit hyperactivity disorder (ADHD), unspecified ADHD type -     methylphenidate 36 MG PO CR tablet; Take 1 tablet (36 mg total) by mouth daily.  Generalized anxiety disorder -     mirtazapine (REMERON) 15 MG tablet; Take 1 tablet (15 mg total) by mouth at bedtime.  Bipolar I disorder (HCC) -     ARIPiprazole (ABILIFY) 5 MG tablet; Take one tablet daily.  Insomnia, unspecified type -     mirtazapine (REMERON) 15 MG tablet; Take 1 tablet (15 mg total) by mouth at bedtime.     Please  see After Visit Summary for patient specific instructions.  Future Appointments  Date Time Provider Department Center  11/30/2020  8:20 AM Remingtyn Depaola, , NP CP-CP None  12/04/2020  8:00 AM , [facility] None  12/17/2020  8:00 AM 12/06/2020, [facility] None  12/31/2020  8:00 AM 02/16/2021, [facility] None  01/14/2021  8:00 AM 01/02/2021, [facility] CP[facility] None    No orders of the defined types were placed in this encounter.   -------------------------------

## 2020-11-30 ENCOUNTER — Encounter: Payer: Self-pay | Admitting: Adult Health

## 2020-11-30 ENCOUNTER — Telehealth (INDEPENDENT_AMBULATORY_CARE_PROVIDER_SITE_OTHER): Payer: BC Managed Care – PPO | Admitting: Adult Health

## 2020-11-30 DIAGNOSIS — F319 Bipolar disorder, unspecified: Secondary | ICD-10-CM

## 2020-11-30 DIAGNOSIS — F411 Generalized anxiety disorder: Secondary | ICD-10-CM | POA: Diagnosis not present

## 2020-11-30 DIAGNOSIS — G4700 Insomnia, unspecified: Secondary | ICD-10-CM

## 2020-11-30 DIAGNOSIS — F909 Attention-deficit hyperactivity disorder, unspecified type: Secondary | ICD-10-CM

## 2020-11-30 MED ORDER — METHYLPHENIDATE HCL ER 36 MG PO TB24: 36.0000 mg | ORAL_TABLET | Freq: Every day | ORAL | 0 refills | Status: DC

## 2020-11-30 NOTE — Progress Notes (Signed)
Jordan Christian [id number] [birth year] 25 y.o.  Virtual Visit via Video Note  I connected with pt @ on 11/30/20 at  8:20 AM EDT by a video enabled telemedicine application and verified that I am speaking with the correct person using two identifiers.   I discussed the limitations of evaluation and management by telemedicine and the availability of in person appointments. The patient expressed understanding and agreed to proceed.  I discussed the assessment and treatment plan with the patient. The patient was provided an opportunity to ask questions and all were answered. The patient agreed with the plan and demonstrated an understanding of the instructions.   The patient was advised to call back or seek an in-person evaluation if the symptoms worsen or if the condition fails to improve as anticipated.  I provided 30 minutes of non-face-to-face time during this encounter.  The patient was located at home.  The provider was located at [facility].   Dorothyann Gibbs, NP   Subjective:   Patient ID:  Jordan Christian is a 25 y.o. (DOB [birth year]) male.  Chief Complaint: No chief complaint on file.   HPI Jordan Christian presents for follow-up of MDD, GAD, ADHD, insomnia.  Describes mood today as "ok". Pleasant. Mood symptoms - denies depression. Reports anxiety - "not too much". Still feels irritable - "3 days a week, it comes and comes". Has stuff to get "mad about", but doesn't. Denies panic attacks. Stating "things are going better". Some alcohol use on the weekends. Denies THC use - over 2 months. Feels like the Abilify has been helpful. Stating "I am comfortable where I'm at right now". Working with therapist. Stable interest and motivation. Taking medication as prescribed. Energy levells improved. Has a regular exercise routine - exercises daily. Enjoys some usual interests and activities. Recently married. Lives with wife, 4 cats and 1 dog. Wife's parents live in [place]. He  lives close to family - grandparents 2 doors down. Spending time with family. Appetite stable. Weight gain - 15 - 280 pounds. Sleeping much better. Averages 6.5 hours. Getting up to go to the bathroom.  Focus and concentration improved. Completing tasks. Managing aspects of household. Works full-time - "getting irritated". Denies SI or HI.  Denies AH or VH.  Previous medication trials: Celexa, Remeron, Concerta, Intuniv, Stratera, Wellbutrin  Review of Systems:  Review of Systems  Musculoskeletal: Negative for gait problem.  Neurological: Negative for tremors.  Psychiatric/Behavioral:       Please refer to HPI    Medications: I have reviewed the patient's current medications.  Current Outpatient Medications  Medication Sig Dispense Refill  . ARIPiprazole (ABILIFY) 5 MG tablet Take one tablet daily. 30 tablet 5  . cetirizine (ZYRTEC ALLERGY) 10 MG tablet Take 1 tablet (10 mg total) by mouth daily. 30 tablet 0  . fluticasone (FLONASE) 50 MCG/ACT nasal spray Place 1 spray into both nostrils daily for 14 days. 16 g 0  . methylphenidate 36 MG PO CR tablet Take 1 tablet (36 mg total) by mouth daily. 30 tablet 0  . mirtazapine (REMERON) 15 MG tablet Take 1 tablet (15 mg total) by mouth at bedtime. 30 tablet 5  . Multiple Vitamin (MULTIVITAMIN WITH MINERALS) TABS tablet Take 1 tablet by mouth daily.    Marland Kitchen omeprazole (PRILOSEC) 40 MG capsule TAKE 1 CAPSULE BY MOUTH ONCE DAILY IN THE MORNING 30 capsule 1   No current facility-administered medications for this visit.    Medication Side Effects: None  Allergies:  No Known Allergies  Past Medical History:  Diagnosis Date  . ADHD   . Depression   . GERD (gastroesophageal reflux disease)     Family History  Adopted: Yes  Problem Relation Age of Onset  . Bipolar disorder Mother   . Bipolar disorder Sister   . Alcohol abuse Father     Social History   Socioeconomic History  . Marital status: Single    Spouse name: Not on file  .  Number of children: Not on file  . Years of education: Not on file  . Highest education level: Not on file  Occupational History  . Occupation: Personnel officer  Tobacco Use  . Smoking status: Former Smoker    Years: 3.50    Types: Cigarettes, E-cigarettes  . Smokeless tobacco: Never Used  Vaping Use  . Vaping Use: Every day  Substance and Sexual Activity  . Alcohol use: No    Alcohol/week: 1.0 - 2.0 standard drink    Types: 1 - 2 Cans of beer per week    Comment: occ  . Drug use: Yes    Types: Marijuana    Comment: occ  . Sexual activity: Yes    Partners: Female    Birth control/protection: None  Other Topics Concern  . Not on file  Social History Narrative  . Not on file   Social Determinants of Health   Financial Resource Strain: Not on file  Food Insecurity: Not on file  Transportation Needs: Not on file  Physical Activity: Not on file  Stress: Not on file  Social Connections: Not on file  Intimate Partner Violence: Not on file    Past Medical History, Surgical history, Social history, and Family history were reviewed and updated as appropriate.   Please see review of systems for further details on the patient's review from today.   Objective:   Physical Exam:  There were no vitals taken for this visit.  Physical Exam Constitutional:      General: He is not in acute distress. Musculoskeletal:        General: No deformity.  Neurological:     Mental Status: He is alert and oriented to person, place, and time.     Coordination: Coordination normal.  Psychiatric:        Attention and Perception: Attention and perception normal. He does not perceive auditory or visual hallucinations.        Mood and Affect: Mood normal. Mood is not anxious or depressed. Affect is not labile, blunt, angry or inappropriate.        Speech: Speech normal.        Behavior: Behavior normal.        Thought Content: Thought content normal. Thought content is not paranoid or delusional.  Thought content does not include homicidal or suicidal ideation. Thought content does not include homicidal or suicidal plan.        Cognition and Memory: Cognition and memory normal.        Judgment: Judgment normal.     Comments: Insight intact     Lab Review:     Component Value Date/Time   NA 141 03/14/2017 1350   K 5.3 (H) 03/14/2017 1350   CL 101 03/14/2017 1350   CO2 26 03/14/2017 1350   GLUCOSE 92 03/14/2017 1350   GLUCOSE 149 (H) 11/30/2010 2143   BUN 7 03/14/2017 1350   CREATININE 0.92 03/14/2017 1350   CALCIUM 9.3 03/14/2017 1350   PROT 7.0 03/14/2017 1350  ALBUMIN 4.7 03/14/2017 1350   AST 15 03/14/2017 1350   ALT 12 03/14/2017 1350   ALKPHOS 68 03/14/2017 1350   BILITOT 0.2 03/14/2017 1350   GFRNONAA 118 03/14/2017 1350   GFRAA 137 03/14/2017 1350       Component Value Date/Time   WBC 5.2 07/06/2017 1615   WBC 5.6 11/30/2010 2143   RBC 4.56 07/06/2017 1615   RBC 4.87 11/30/2010 2143   HGB 13.8 07/06/2017 1615   HCT 40.3 07/06/2017 1615   PLT 224 07/06/2017 1615   MCV 88 07/06/2017 1615   MCH 30.3 07/06/2017 1615   MCH 29.4 11/30/2010 2143   MCHC 34.2 07/06/2017 1615   MCHC 33.8 11/30/2010 2143   RDW 13.5 07/06/2017 1615   LYMPHSABS 1.8 07/06/2017 1615   MONOABS 0.4 11/30/2010 2143   EOSABS 0.2 07/06/2017 1615   BASOSABS 0.0 07/06/2017 1615    No results found for: POCLITH, LITHIUM   No results found for: PHENYTOIN, PHENOBARB, VALPROATE, CBMZ   .res Assessment: Plan:    Plan:  PDMP reviewed  1. Abilify 5mg  daily 2. Concerta 36mg  every morning 3. Remeron 15mg  at bedtime  RTC 4 weeks  Patient advised to contact office with any questions, adverse effects, or acute worsening in signs and symptoms.  Discussed potential metabolic side effects associated with atypical antipsychotics, as well as potential risk for movement side effects. Advised pt to contact office if movement side effects occur.     Diagnoses and all orders for this  visit:  Insomnia, unspecified type  Attention deficit hyperactivity disorder (ADHD), unspecified ADHD type  Bipolar I disorder (HCC)  Generalized anxiety disorder     Please see After Visit Summary for patient specific instructions.  Future Appointments  Date Time Provider Department Center  12/04/2020  8:00 AM , [facility] None  12/31/2020  8:00 AM Stevphen Meuse, [facility] None  01/14/2021  8:00 AM Stevphen Meuse, [facility] None    No orders of the defined types were placed in this encounter.     -------------------------------

## 2020-12-04 ENCOUNTER — Ambulatory Visit: Payer: BC Managed Care – PPO | Admitting: Psychiatry

## 2020-12-05 ENCOUNTER — Other Ambulatory Visit: Payer: Self-pay | Admitting: Adult Health

## 2020-12-05 ENCOUNTER — Telehealth: Payer: Self-pay | Admitting: Adult Health

## 2020-12-05 DIAGNOSIS — F909 Attention-deficit hyperactivity disorder, unspecified type: Secondary | ICD-10-CM

## 2020-12-05 MED ORDER — METHYLPHENIDATE HCL ER 54 MG PO TB24: 54.0000 mg | ORAL_TABLET | Freq: Every day | ORAL | 0 refills | Status: DC

## 2020-12-05 NOTE — Telephone Encounter (Signed)
Pt informed me when he first started Concerta it worked well for him and he was able to focus but now everyday is a struggle and he feels defeated.He wants to know what can be done

## 2020-12-05 NOTE — Telephone Encounter (Signed)
We can increase the dose to 54mg  daily.

## 2020-12-05 NOTE — Telephone Encounter (Signed)
Script sent  

## 2020-12-05 NOTE — Telephone Encounter (Signed)
Pt would like that sent to [id number] - [place], [place] - [address]

## 2020-12-05 NOTE — Telephone Encounter (Signed)
Pt left message that his ADHD medication is not working and he would like to discuss any options that he may have. Please call. [phone].

## 2020-12-17 ENCOUNTER — Ambulatory Visit: Payer: BC Managed Care – PPO | Admitting: Psychiatry

## 2020-12-31 ENCOUNTER — Telehealth: Payer: Self-pay | Admitting: Psychiatry

## 2020-12-31 ENCOUNTER — Ambulatory Visit (INDEPENDENT_AMBULATORY_CARE_PROVIDER_SITE_OTHER): Payer: BC Managed Care – PPO | Admitting: Psychiatry

## 2020-12-31 DIAGNOSIS — F319 Bipolar disorder, unspecified: Secondary | ICD-10-CM | POA: Diagnosis not present

## 2020-12-31 NOTE — Telephone Encounter (Signed)
Mr. Jordan Christian, Jordan Christian are scheduled for a virtual visit with your provider today.    Just as we do with appointments in the office, we must obtain your consent to participate.  Your consent will be active for this visit and any virtual visit you may have with one of our providers in the next 365 days.    If you have a MyChart account, I can also send a copy of this consent to you electronically.  All virtual visits are billed to your insurance company just like a traditional visit in the office.  As this is a virtual visit, video technology does not allow for your provider to perform a traditional examination.  This may limit your provider's ability to fully assess your condition.  If your provider identifies any concerns that need to be evaluated in person or the need to arrange testing such as labs, EKG, etc, we will make arrangements to do so.    Although advances in technology are sophisticated, we cannot ensure that it will always work on either your end or our end.  If the connection with a video visit is poor, we may have to switch to a telephone visit.  With either a video or telephone visit, we are not always able to ensure that we have a secure connection.   I need to obtain your verbal consent now.   Are you willing to proceed with your visit today?   Jordan Christian has provided verbal consent on 12/31/2020 for a virtual visit (video or telephone).   Jordan Christian, [facility] 12/31/2020  8:07 AM

## 2020-12-31 NOTE — Progress Notes (Signed)
[facility] Counselor/Therapist Progress Note  Patient ID: Jordan Christian, MRN: [id number],    Date: 01/01/2021  Time Spent: 49 minutes start time 8:05 AM and time 8:54 AM Virtual Visit via Telephone Note Connected with patient by a video enabled telemedicine/telehealth application, with their informed consent, and verified patient privacy and that I am speaking with the correct person using two identifiers. I discussed the limitations, risks, security and privacy concerns of performing psychotherapy and management service by telephone and the availability of in person appointments. I also discussed with the patient that there may be a patient responsible charge related to this service. The patient expressed understanding and agreed to proceed. I discussed the treatment planning with the patient. The patient was provided an opportunity to ask questions and all were answered. The patient agreed with the plan and demonstrated an understanding of the instructions. The patient was advised to call  our office if  symptoms worsen or feel they are in a crisis state and need immediate contact.   Therapist Location: office Patient Location: Work    Treatment Type: Individual Therapy  Reported Symptoms: anxiety, irritability, weight 25 lbs, chronic pain,sleep issues, frustration  Mental Status Exam:  Appearance:   Casual and Neat     Behavior:  Appropriate  Motor:  Normal  Speech/Language:   Normal Rate  Affect:  Appropriate  Mood:  normal  Thought process:  normal  Thought content:    WNL  Sensory/Perceptual disturbances:    WNL  Orientation:  oriented to person, place, time/date and situation  Attention:  Good  Concentration:  Good  Memory:  WNL  Fund of knowledge:   Good  Insight:    Good  Judgment:   Good  Impulse Control:  Good   Risk Assessment: Danger to Self:  No Self-injurious Behavior: No Danger to Others: No Duty to Warn:no Physical Aggression / Violence:No  Access  to Firearms a concern: No  Gang Involvement:No   Subjective: Met with patient via virtual session.  He reported that his mood has been good overall.  He shared that he has been running a few miles a day and he has still gained 25 lbs.  He reported that he went on a vacation with his family and that went well.  He has switched jobs again.  Patient explained that he was being asked to do things that he knew he was not supposed to be doing at his past job and it was creating a great deal of stress for him and he realized he just could not continue.  Patient confronted the issue and was let go from that job but has found another job that he is feeling good about currently.  He shared that they have asked him to apply for a full-time position but he knows that will be a cut in pay so he is not sure how he wants to handle it because he does want a full-time position.  Patient was encouraged to give himself time to think through what would be best for his family rather than jumping into a quick decision.  Patient also shared that he is wanting to take a course to prepare him for his licensing test.  Patient shared he is having lots of anxiety and insecurity about the issue.  Discussed the importance of him focusing on how he is very capable of doing his job and he always gets a job quickly because he knows what he is doing that means  he will pass the test.  Patient reported at times he is having a desire to smoke weed and he does not want to engage.  Encouraged him to consider the desire as information that he needs to do something regarding his stress.  Encouraged him to talk through what is going on with him with his wife at that point also to continue exercising and releasing emotions appropriately.  The other option would be to look at the things stressing him and finding out solutions for them so they do not have to be stressful any longer.  Patient was able to feel positive about plans from session and feel that  things discussed would be helpful.  Agreed that patient will assess how the plans are working at next session and develop more possibilities if needed.  Interventions: Cognitive Behavioral Therapy and Solution-Oriented/Positive Psychology  Diagnosis:   ICD-10-CM   1. Bipolar I disorder ([place])  F31.9     Plan: Patient is to use CBT and coping skills to decrease anxiety and depression symptoms.  Patient is to work on positive affirmations and reminding himself that he does a good job and knows what he is doing.  Patient is to continue exercising daily to release negative emotions appropriately.  Patient is to recognize his desire to use his information that he needs to handle a different situation that is creating stress and communicate his concerns with his wife.  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 Long-term goal: Enhance ability to handle effectively the full variety of life's anxieties Short-term goal: Identify major life conflicts from the past and present that form the basis for present anxiety   Lina Sayre, [facility]

## 2021-01-14 ENCOUNTER — Ambulatory Visit: Payer: BC Managed Care – PPO | Admitting: Psychiatry

## 2021-01-16 ENCOUNTER — Encounter: Payer: Self-pay | Admitting: Adult Health

## 2021-01-16 ENCOUNTER — Telehealth (INDEPENDENT_AMBULATORY_CARE_PROVIDER_SITE_OTHER): Payer: BC Managed Care – PPO | Admitting: Adult Health

## 2021-01-16 DIAGNOSIS — F909 Attention-deficit hyperactivity disorder, unspecified type: Secondary | ICD-10-CM

## 2021-01-16 DIAGNOSIS — F319 Bipolar disorder, unspecified: Secondary | ICD-10-CM

## 2021-01-16 DIAGNOSIS — F411 Generalized anxiety disorder: Secondary | ICD-10-CM

## 2021-01-16 DIAGNOSIS — G4700 Insomnia, unspecified: Secondary | ICD-10-CM

## 2021-01-16 MED ORDER — ARIPIPRAZOLE 5 MG PO TABS
ORAL_TABLET | ORAL | 5 refills | Status: DC
Start: 2021-01-16 — End: 2021-05-22

## 2021-01-16 MED ORDER — MIRTAZAPINE 15 MG PO TABS: 15.0000 mg | ORAL_TABLET | Freq: Every day | ORAL | 5 refills | Status: DC

## 2021-01-16 MED ORDER — METHYLPHENIDATE HCL ER 54 MG PO TB24: 54.0000 mg | ORAL_TABLET | Freq: Every day | ORAL | 0 refills | Status: DC

## 2021-01-16 NOTE — Progress Notes (Signed)
Jordan Christian [id number] 13-Feb-1996 25 y.o.  Virtual Visit via Video Note  I connected with pt @ on 01/16/21 at 12:00 PM EDT by a video enabled telemedicine application and verified that I am speaking with the correct person using two identifiers.   I discussed the limitations of evaluation and management by telemedicine and the availability of in person appointments. The patient expressed understanding and agreed to proceed.  I discussed the assessment and treatment plan with the patient. The patient was provided an opportunity to ask questions and all were answered. The patient agreed with the plan and demonstrated an understanding of the instructions.   The patient was advised to call back or seek an in-person evaluation if the symptoms worsen or if the condition fails to improve as anticipated.  I provided 30 minutes of non-face-to-face time during this encounter.  The patient was located at home.  The provider was located at [facility].   Jordan Gibbs, NP   Subjective:   Patient ID:  Jordan Christian is a 25 y.o. (DOB [birth year]) male.  Chief Complaint: No chief complaint on file.   HPI Jordan Christian presents for follow-up of MDD, GAD, ADHD, insomnia.  Describes mood today as "ok". Pleasant. Mood symptoms - denies depression and anxiety. Irritability has decreased. Reports a "mini" panic attack. Still getting in his head at times. Stating "I'm doing pretty well for the most part. Has refrained from [facility] use for four months. Increased alcohol use. Reporting sexual side effects - "sometimes". Working with therapist. Stable interest and motivation. Taking medication as prescribed. Energy levels improved. Has a regular exercise routine - exercises 3 x times a week. Enjoys some usual interests and activities. Lives with wife, 4 cats and 1 dog. Wife's parents live in [place]. He lives close to family - grandparents 2 doors down. Spending time with family. Appetite  stable. Weight gain - 18 - 195 pounds Sleeping much better. Averages 6.5 hours.  Focus and concentration improved. Completing tasks. Managing aspects of household. Works full-times. Denies SI or HI.  Denies AH or VH.  Previous medication trials: Celexa, Remeron, Concerta, Intuniv, Stratera, Wellbutrin  Review of Systems:  Review of Systems  Musculoskeletal: Negative for gait problem.  Neurological: Negative for tremors.  Psychiatric/Behavioral:       Please refer to HPI    Medications: I have reviewed the patient's current medications.  Current Outpatient Medications  Medication Sig Dispense Refill  . ARIPiprazole (ABILIFY) 5 MG tablet Take one tablet daily. 30 tablet 5  . cetirizine (ZYRTEC ALLERGY) 10 MG tablet Take 1 tablet (10 mg total) by mouth daily. 30 tablet 0  . fluticasone (FLONASE) 50 MCG/ACT nasal spray Place 1 spray into both nostrils daily for 14 days. 16 g 0  . Methylphenidate HCl ER 54 MG TB24 Take 54 mg by mouth daily. 30 tablet 0  . mirtazapine (REMERON) 15 MG tablet Take 1 tablet (15 mg total) by mouth at bedtime. 30 tablet 5  . Multiple Vitamin (MULTIVITAMIN WITH MINERALS) TABS tablet Take 1 tablet by mouth daily.    Marland Kitchen omeprazole (PRILOSEC) 40 MG capsule TAKE 1 CAPSULE BY MOUTH ONCE DAILY IN THE MORNING 30 capsule 1   No current facility-administered medications for this visit.    Medication Side Effects: None  Allergies: No Known Allergies  Past Medical History:  Diagnosis Date  . ADHD   . Depression   . GERD (gastroesophageal reflux disease)     Family History  Adopted:  Yes  Problem Relation Age of Onset  . Bipolar disorder Mother   . Bipolar disorder Sister   . Alcohol abuse Father     Social History   Socioeconomic History  . Marital status: Single    Spouse name: Not on file  . Number of children: Not on file  . Years of education: Not on file  . Highest education level: Not on file  Occupational History  . Occupation: Personnel officer   Tobacco Use  . Smoking status: Former Smoker    Years: 3.50    Types: Cigarettes, E-cigarettes  . Smokeless tobacco: Never Used  Vaping Use  . Vaping Use: Every day  Substance and Sexual Activity  . Alcohol use: No    Alcohol/week: 1.0 - 2.0 standard drink    Types: 1 - 2 Cans of beer per week    Comment: occ  . Drug use: Yes    Types: Marijuana    Comment: occ  . Sexual activity: Yes    Partners: Female    Birth control/protection: None  Other Topics Concern  . Not on file  Social History Narrative  . Not on file   Social Determinants of Health   Financial Resource Strain: Not on file  Food Insecurity: Not on file  Transportation Needs: Not on file  Physical Activity: Not on file  Stress: Not on file  Social Connections: Not on file  Intimate Partner Violence: Not on file    Past Medical History, Surgical history, Social history, and Family history were reviewed and updated as appropriate.   Please see review of systems for further details on the patient's review from today.   Objective:   Physical Exam:  There were no vitals taken for this visit.  Physical Exam Constitutional:      General: He is not in acute distress. Musculoskeletal:        General: No deformity.  Neurological:     Mental Status: He is alert and oriented to person, place, and time.     Coordination: Coordination normal.  Psychiatric:        Attention and Perception: Attention and perception normal. He does not perceive auditory or visual hallucinations.        Mood and Affect: Mood normal. Mood is not anxious or depressed. Affect is not labile, blunt, angry or inappropriate.        Speech: Speech normal.        Behavior: Behavior normal.        Thought Content: Thought content normal. Thought content is not paranoid or delusional. Thought content does not include homicidal or suicidal ideation. Thought content does not include homicidal or suicidal plan.        Cognition and Memory:  Cognition and memory normal.        Judgment: Judgment normal.     Comments: Insight intact     Lab Review:     Component Value Date/Time   NA 141 03/14/2017 1350   K 5.3 (H) 03/14/2017 1350   CL 101 03/14/2017 1350   CO2 26 03/14/2017 1350   GLUCOSE 92 03/14/2017 1350   GLUCOSE 149 (H) 11/30/2010 2143   BUN 7 03/14/2017 1350   CREATININE 0.92 03/14/2017 1350   CALCIUM 9.3 03/14/2017 1350   PROT 7.0 03/14/2017 1350   ALBUMIN 4.7 03/14/2017 1350   AST 15 03/14/2017 1350   ALT 12 03/14/2017 1350   ALKPHOS 68 03/14/2017 1350   BILITOT 0.2 03/14/2017 1350   GFRNONAA 118  03/14/2017 1350   GFRAA 137 03/14/2017 1350       Component Value Date/Time   WBC 5.2 07/06/2017 1615   WBC 5.6 11/30/2010 2143   RBC 4.56 07/06/2017 1615   RBC 4.87 11/30/2010 2143   HGB 13.8 07/06/2017 1615   HCT 40.3 07/06/2017 1615   PLT 224 07/06/2017 1615   MCV 88 07/06/2017 1615   MCH 30.3 07/06/2017 1615   MCH 29.4 11/30/2010 2143   MCHC 34.2 07/06/2017 1615   MCHC 33.8 11/30/2010 2143   RDW 13.5 07/06/2017 1615   LYMPHSABS 1.8 07/06/2017 1615   MONOABS 0.4 11/30/2010 2143   EOSABS 0.2 07/06/2017 1615   BASOSABS 0.0 07/06/2017 1615    No results found for: POCLITH, LITHIUM   No results found for: PHENYTOIN, PHENOBARB, VALPROATE, CBMZ   .res Assessment: Plan:    Plan:  PDMP reviewed  1. Abilify 5mg  daily 2. Increase Concerta 36mg  to 54mg  every morning 3. Remeron 15mg  at bedtime  RTC 4 weeks  Patient advised to contact office with any questions, adverse effects, or acute worsening in signs and symptoms.  Discussed potential metabolic side effects associated with atypical antipsychotics, as well as potential risk for movement side effects. Advised pt to contact office if movement side effects occur.    Time spent with patient was minutes. Greater than 50% of face to face time with patient was spent on counseling and coordination of care. We discussed     Diagnoses and all  orders for this visit:  Generalized anxiety disorder -     mirtazapine (REMERON) 15 MG tablet; Take 1 tablet (15 mg total) by mouth at bedtime.  Insomnia, unspecified type -     mirtazapine (REMERON) 15 MG tablet; Take 1 tablet (15 mg total) by mouth at bedtime.  Attention deficit hyperactivity disorder (ADHD), unspecified ADHD type -     Methylphenidate HCl ER 54 MG TB24; Take 54 mg by mouth daily.  Bipolar I disorder (HCC) -     ARIPiprazole (ABILIFY) 5 MG tablet; Take one tablet daily.     Please see After Visit Summary for patient specific instructions.  No future appointments.  No orders of the defined types were placed in this encounter.     -------------------------------

## 2021-04-18 ENCOUNTER — Other Ambulatory Visit: Payer: Self-pay

## 2021-04-18 ENCOUNTER — Ambulatory Visit
Admission: EM | Admit: 2021-04-18 | Discharge: 2021-04-18 | Disposition: A | Discharge status: Home / Self Care | Payer: BC Managed Care – PPO | Attending: Emergency Medicine | Admitting: Emergency Medicine

## 2021-04-18 DIAGNOSIS — J069 Acute upper respiratory infection, unspecified: Secondary | ICD-10-CM

## 2021-04-18 DIAGNOSIS — R059 Cough, unspecified: Secondary | ICD-10-CM

## 2021-04-18 NOTE — ED Provider Notes (Signed)
[facility]   [id number] 04/18/21 Arrival Time: 1732   CC: COVID symptoms  SUBJECTIVE: History from: patient.  Jordan Christian is a 25 y.o. male who presents with cough, congestion, sore throat, and fatigue x 2 days.  Denies known exposure.  Has not tried OTC medications.  Denies alleviating factors.  Denies previous symptoms in the past.   Denies fever, chills, SOB, wheezing, chest pain, nausea, changes in bowel or bladder habits.    ROS: As per HPI.  All other pertinent ROS negative.     Past Medical History:  Diagnosis Date   ADHD    Depression    GERD (gastroesophageal reflux disease)    Past Surgical History:  Procedure Laterality Date   WISDOM TOOTH EXTRACTION     No Known Allergies No current facility-administered medications on file prior to encounter.   Current Outpatient Medications on File Prior to Encounter  Medication Sig Dispense Refill   ARIPiprazole (ABILIFY) 5 MG tablet Take one tablet daily. 30 tablet 5   cetirizine (ZYRTEC ALLERGY) 10 MG tablet Take 1 tablet (10 mg total) by mouth daily. 30 tablet 0   Methylphenidate HCl ER 54 MG TB24 Take 54 mg by mouth daily. 30 tablet 0   mirtazapine (REMERON) 15 MG tablet Take 1 tablet (15 mg total) by mouth at bedtime. 30 tablet 5   Multiple Vitamin (MULTIVITAMIN WITH MINERALS) TABS tablet Take 1 tablet by mouth daily.     omeprazole (PRILOSEC) 40 MG capsule TAKE 1 CAPSULE BY MOUTH ONCE DAILY IN THE MORNING 30 capsule 1   fluticasone (FLONASE) 50 MCG/ACT nasal spray Place 1 spray into both nostrils daily for 14 days. 16 g 0   Social History   Socioeconomic History   Marital status: Single    Spouse name: Not on file   Number of children: Not on file   Years of education: Not on file   Highest education level: Not on file  Occupational History   Occupation: Personnel officer  Tobacco Use   Smoking status: Former    Years: 3.50    Types: Cigarettes, E-cigarettes   Smokeless tobacco: Never  Haematologist Use: Every day  Substance and Sexual Activity   Alcohol use: No    Alcohol/week: 1.0 - 2.0 standard drink    Types: 1 - 2 Cans of beer per week    Comment: occ   Drug use: Yes    Types: Marijuana    Comment: occ   Sexual activity: Yes    Partners: Female    Birth control/protection: None  Other Topics Concern   Not on file  Social History Narrative   Not on file   Social Determinants of Health   Financial Resource Strain: Not on file  Food Insecurity: Not on file  Transportation Needs: Not on file  Physical Activity: Not on file  Stress: Not on file  Social Connections: Not on file  Intimate Partner Violence: Not on file   Family History  Adopted: Yes  Problem Relation Age of Onset   Bipolar disorder Mother    Bipolar disorder Sister    Alcohol abuse Father     OBJECTIVE:  Vitals:   04/18/21 1752 04/18/21 1753  BP:  131/79  Pulse:  (!) 105  Temp:  98.2 F (36.8 C)  TempSrc:  Oral  SpO2:  96%  Weight: 195 lb (88.5 kg)   Height: 5\' 6"  (1.676 m)      General appearance: alert; well-appearing,  nontoxic; speaking in full sentences and tolerating own secretions HEENT: NCAT; Ears: EACs clear, TMs pearly gray; Eyes: PERRL.  EOM grossly intact.Nose: nares patent without rhinorrhea, Throat: oropharynx clear, tonsils non erythematous or enlarged, uvula midline  Neck: supple without LAD Lungs: unlabored respirations, symmetrical air entry; cough: absent; no respiratory distress; CTAB Heart: regular rate and rhythm.  Skin: warm and dry Psychological: alert and cooperative; normal mood and affect   ASSESSMENT & PLAN:  1. Cough   2. Viral URI with cough     COVID testing ordered.  It will take between 5-7 days for test results.  Someone will contact you regarding abnormal results.    In the meantime: You should remain isolated in your home for 5 days from symptom onset AND greater than 72 hours after symptoms resolution (absence of fever without the use of  fever-reducing medication and improvement in respiratory symptoms), whichever is longer Get plenty of rest and push fluids Use OTC zyrtec for nasal congestion, runny nose, and/or sore throat Use OTC flonase for nasal congestion and runny nose Use medications daily for symptom relief Use OTC medications like ibuprofen or tylenol as needed fever or pain Call or go to the ED if you have any new or worsening symptoms such as fever, worsening cough, shortness of breath, chest tightness, chest pain, turning blue, changes in mental status, etc...   Reviewed expectations re: course of current medical issues. Questions answered. Outlined signs and symptoms indicating need for more acute intervention. Patient verbalized understanding. After Visit Summary given.          Rennis Harding, PA-C 04/18/21 1902

## 2021-04-18 NOTE — ED Triage Notes (Signed)
Pt states that he has cough, nasal congestion, sore throat, fatigue. X2 days pt isn't vaccinated

## 2021-04-18 NOTE — Discharge Instructions (Addendum)
COVID testing ordered.  It will take between 5-7 days for test results.  Someone will contact you regarding abnormal results.    In the meantime: You should remain isolated in your home for 5 days from symptom onset AND greater than 72 hours after symptoms resolution (absence of fever without the use of fever-reducing medication and improvement in respiratory symptoms), whichever is longer Get plenty of rest and push fluids Use OTC zyrtec for nasal congestion, runny nose, and/or sore throat Use OTC flonase for nasal congestion and runny nose Use medications daily for symptom relief Use OTC medications like ibuprofen or tylenol as needed fever or pain Call or go to the ED if you have any new or worsening symptoms such as fever, worsening cough, shortness of breath, chest tightness, chest pain, turning blue, changes in mental status, etc...  

## 2021-04-19 LAB — NOVEL CORONAVIRUS, NAA: SARS-CoV-2, NAA: NOT DETECTED

## 2021-04-19 LAB — SARS-COV-2, NAA 2 DAY TAT

## 2021-05-08 ENCOUNTER — Ambulatory Visit: Payer: BC Managed Care – PPO | Admitting: Adult Health

## 2021-05-22 ENCOUNTER — Telehealth (INDEPENDENT_AMBULATORY_CARE_PROVIDER_SITE_OTHER): Payer: BC Managed Care – PPO | Admitting: Adult Health

## 2021-05-22 ENCOUNTER — Encounter: Payer: Self-pay | Admitting: Adult Health

## 2021-05-22 DIAGNOSIS — F909 Attention-deficit hyperactivity disorder, unspecified type: Secondary | ICD-10-CM

## 2021-05-22 DIAGNOSIS — F411 Generalized anxiety disorder: Secondary | ICD-10-CM

## 2021-05-22 DIAGNOSIS — G4700 Insomnia, unspecified: Secondary | ICD-10-CM | POA: Diagnosis not present

## 2021-05-22 DIAGNOSIS — F319 Bipolar disorder, unspecified: Secondary | ICD-10-CM

## 2021-05-22 MED ORDER — MIRTAZAPINE 15 MG PO TABS
15.0000 mg | ORAL_TABLET | Freq: Every day | ORAL | 5 refills | Status: DC
Start: 2021-05-22 — End: 2021-07-26

## 2021-05-22 MED ORDER — ARIPIPRAZOLE 5 MG PO TABS: ORAL_TABLET | ORAL | 5 refills | Status: DC

## 2021-05-22 NOTE — Progress Notes (Signed)
AYAN HEFFINGTON [id number] [birth year] 25 y.o.  Virtual Visit via Video Note  I connected with pt @ on 05/22/21 at  1:40 PM EDT by a video enabled telemedicine application and verified that I am speaking with the correct person using two identifiers.   I discussed the limitations of evaluation and management by telemedicine and the availability of in person appointments. The patient expressed understanding and agreed to proceed.  I discussed the assessment and treatment plan with the patient. The patient was provided an opportunity to ask questions and all were answered. The patient agreed with the plan and demonstrated an understanding of the instructions.   The patient was advised to call back or seek an in-person evaluation if the symptoms worsen or if the condition fails to improve as anticipated.  I provided 25 minutes of non-face-to-face time during this encounter.  The patient was located at home.  The provider was located at [facility].   Jordan Gibbs, NP   Subjective:   Patient ID:  Jordan Christian is a 25 y.o. (DOB 1995-11-25) male.  Chief Complaint: No chief complaint on file.   HPI Jordan Christian presents for follow-up of MDD, GAD, ADHD, insomnia.  Wife also commenting on patient's mood symptoms.  Describes mood today as "ok". Pleasant. Mood symptoms - reports depression, anxiety, and irritability. Denies panic attacks. Feels like he stays in his head - wife feels like he is communicating better with her. Has started smoking marijuana again - smoking every day since May. Consumes alcohol - 2 to 3 drinks a week. Has stopped medications - 4 to 5 months. Feels like medications were helpful, but got out of his normal routine of taking them. Has ruined his relationship with sister. Lost one of his jobs.Has been lying to wife. Not sleeping. Quick tempered. Going from angry to sad quickly. Low motivation - saying he would do things, then not doing them. Working with  therapist. Stable interest and motivation. Taking medication as prescribed. Energy levels lower - having one good day a week. Does not have a regular exercise routine. Enjoys some usual interests and activities. Lives with wife, 4 cats and 1 dog. Wife's parents live in [place]. He lives close to family - grandparents 2 doors down. Spending time with family. Appetite stable. Weight gain - 18 - 195 pounds Sleeping much better. Averages 4 to 5 hours.  Focus and concentration improved. Completing tasks. Managing aspects of household. Works full-time. Denies SI or HI.  Denies AH or VH.  Previous medication trials: Celexa, Remeron, Concerta, Intuniv, Stratera, Wellbutrin  Review of Systems:  Review of Systems  Musculoskeletal:  Negative for gait problem.  Neurological:  Negative for tremors.  Psychiatric/Behavioral:         Please refer to HPI   Medications: I have reviewed the patient's current medications.  Current Outpatient Medications  Medication Sig Dispense Refill   ARIPiprazole (ABILIFY) 5 MG tablet Take one tablet daily. 30 tablet 5   cetirizine (ZYRTEC ALLERGY) 10 MG tablet Take 1 tablet (10 mg total) by mouth daily. 30 tablet 0   fluticasone (FLONASE) 50 MCG/ACT nasal spray Place 1 spray into both nostrils daily for 14 days. 16 g 0   Methylphenidate HCl ER 54 MG TB24 Take 54 mg by mouth daily. 30 tablet 0   mirtazapine (REMERON) 15 MG tablet Take 1 tablet (15 mg total) by mouth at bedtime. 30 tablet 5   Multiple Vitamin (MULTIVITAMIN WITH MINERALS) TABS tablet Take 1  tablet by mouth daily.     omeprazole (PRILOSEC) 40 MG capsule TAKE 1 CAPSULE BY MOUTH ONCE DAILY IN THE MORNING 30 capsule 1   No current facility-administered medications for this visit.    Medication Side Effects: None  Allergies: No Known Allergies  Past Medical History:  Diagnosis Date   ADHD    Depression    GERD (gastroesophageal reflux disease)     Family History  Adopted: Yes  Problem  Relation Age of Onset   Bipolar disorder Mother    Bipolar disorder Sister    Alcohol abuse Father     Social History   Socioeconomic History   Marital status: Single    Spouse name: Not on file   Number of children: Not on file   Years of education: Not on file   Highest education level: Not on file  Occupational History   Occupation: Personnel officer  Tobacco Use   Smoking status: Former    Years: 3.50    Types: Cigarettes, E-cigarettes   Smokeless tobacco: Never  Building services engineer Use: Every day  Substance and Sexual Activity   Alcohol use: No    Alcohol/week: 1.0 - 2.0 standard drink    Types: 1 - 2 Cans of beer per week    Comment: occ   Drug use: Yes    Types: Marijuana    Comment: occ   Sexual activity: Yes    Partners: Female    Birth control/protection: None  Other Topics Concern   Not on file  Social History Narrative   Not on file   Social Determinants of Health   Financial Resource Strain: Not on file  Food Insecurity: Not on file  Transportation Needs: Not on file  Physical Activity: Not on file  Stress: Not on file  Social Connections: Not on file  Intimate Partner Violence: Not on file    Past Medical History, Surgical history, Social history, and Family history were reviewed and updated as appropriate.   Please see review of systems for further details on the patient's review from today.   Objective:   Physical Exam:  There were no vitals taken for this visit.  Physical Exam Constitutional:      General: He is not in acute distress. Musculoskeletal:        General: No deformity.  Neurological:     Mental Status: He is alert and oriented to person, place, and time.     Coordination: Coordination normal.  Psychiatric:        Attention and Perception: Attention and perception normal. He does not perceive auditory or visual hallucinations.        Mood and Affect: Mood normal. Mood is not anxious or depressed. Affect is not labile, blunt,  angry or inappropriate.        Speech: Speech normal.        Behavior: Behavior normal.        Thought Content: Thought content normal. Thought content is not paranoid or delusional. Thought content does not include homicidal or suicidal ideation. Thought content does not include homicidal or suicidal plan.        Cognition and Memory: Cognition and memory normal.        Judgment: Judgment normal.     Comments: Insight intact    Lab Review:     Component Value Date/Time   NA 141 03/14/2017 1350   K 5.3 (H) 03/14/2017 1350   CL 101 03/14/2017 1350   CO2 26  03/14/2017 1350   GLUCOSE 92 03/14/2017 1350   GLUCOSE 149 (H) 11/30/2010 2143   BUN 7 03/14/2017 1350   CREATININE 0.92 03/14/2017 1350   CALCIUM 9.3 03/14/2017 1350   PROT 7.0 03/14/2017 1350   ALBUMIN 4.7 03/14/2017 1350   AST 15 03/14/2017 1350   ALT 12 03/14/2017 1350   ALKPHOS 68 03/14/2017 1350   BILITOT 0.2 03/14/2017 1350   GFRNONAA 118 03/14/2017 1350   GFRAA 137 03/14/2017 1350       Component Value Date/Time   WBC 5.2 07/06/2017 1615   WBC 5.6 11/30/2010 2143   RBC 4.56 07/06/2017 1615   RBC 4.87 11/30/2010 2143   HGB 13.8 07/06/2017 1615   HCT 40.3 07/06/2017 1615   PLT 224 07/06/2017 1615   MCV 88 07/06/2017 1615   MCH 30.3 07/06/2017 1615   MCH 29.4 11/30/2010 2143   MCHC 34.2 07/06/2017 1615   MCHC 33.8 11/30/2010 2143   RDW 13.5 07/06/2017 1615   LYMPHSABS 1.8 07/06/2017 1615   MONOABS 0.4 11/30/2010 2143   EOSABS 0.2 07/06/2017 1615   BASOSABS 0.0 07/06/2017 1615    No results found for: POCLITH, LITHIUM   No results found for: PHENYTOIN, PHENOBARB, VALPROATE, CBMZ   .res Assessment: Plan:    Plan:  PDMP reviewed  1. Restart Abilify 5mg  daily 2. D/C Concerta 36mg  to 54mg  every morning 3. Restart Remeron 15mg  at bedtime   RTC 4 weeks  Patient advised to contact office with any questions, adverse effects, or acute worsening in signs and symptoms.  Discussed potential metabolic  side effects associated with atypical antipsychotics, as well as potential risk for movement side effects. Advised pt to contact office if movement side effects occur.    There are no diagnoses linked to this encounter.   Please see After Visit Summary for patient specific instructions.  No future appointments.  No orders of the defined types were placed in this encounter.     -------------------------------

## 2021-06-04 ENCOUNTER — Telehealth: Payer: Self-pay | Admitting: Adult Health

## 2021-06-04 NOTE — Telephone Encounter (Signed)
Pt informed

## 2021-06-04 NOTE — Telephone Encounter (Signed)
Have him stop the Abilify and continue with the Remeron and get into a routine with sleep and call back with an update.

## 2021-06-04 NOTE — Telephone Encounter (Signed)
On 9/15 you had him restart the Abilify and remeron.Now pt is calling with above listed symptoms

## 2021-06-04 NOTE — Telephone Encounter (Signed)
Jordan Christian called to report that since restarting medications he is having mood issues.  He feels very frustrated all the time, he is arguing with his wife a lot to the point that they are both at their wits end.  He wasn't like this before.  The sleep is working to some extent.  He is actually oversleeping causing him to be late to work.  Please call to discuss possibly med adjustments or changes.

## 2021-06-14 ENCOUNTER — Encounter: Payer: Self-pay | Admitting: Adult Health

## 2021-06-14 ENCOUNTER — Telehealth (INDEPENDENT_AMBULATORY_CARE_PROVIDER_SITE_OTHER): Payer: BC Managed Care – PPO | Admitting: Adult Health

## 2021-06-14 DIAGNOSIS — F411 Generalized anxiety disorder: Secondary | ICD-10-CM

## 2021-06-14 DIAGNOSIS — F319 Bipolar disorder, unspecified: Secondary | ICD-10-CM

## 2021-06-14 DIAGNOSIS — F909 Attention-deficit hyperactivity disorder, unspecified type: Secondary | ICD-10-CM

## 2021-06-14 DIAGNOSIS — G4700 Insomnia, unspecified: Secondary | ICD-10-CM

## 2021-06-14 MED ORDER — DESVENLAFAXINE SUCCINATE ER 50 MG PO TB24: 50.0000 mg | ORAL_TABLET | Freq: Every day | ORAL | 2 refills | Status: DC

## 2021-06-14 NOTE — Progress Notes (Signed)
Jordan Christian [id number] 1996/05/08 25 y.o.  Virtual Visit via Video Note  I connected with pt @ on 06/14/21 at  3:40 PM EDT by a video enabled telemedicine application and verified that I am speaking with the correct person using two identifiers.   I discussed the limitations of evaluation and management by telemedicine and the availability of in person appointments. The patient expressed understanding and agreed to proceed.  I discussed the assessment and treatment plan with the patient. The patient was provided an opportunity to ask questions and all were answered. The patient agreed with the plan and demonstrated an understanding of the instructions.   The patient was advised to call back or seek an in-person evaluation if the symptoms worsen or if the condition fails to improve as anticipated.  I provided 25 minutes of non-face-to-face time during this encounter.  The patient was located at home.  The provider was located at [facility].   Dorothyann Gibbs, NP   Subjective:   Patient ID:  Jordan Christian is a 25 y.o. (DOB [birth year]) male.  Chief Complaint: No chief complaint on file.   HPI Jordan Christian presents for follow-up of MDD, GAD, ADHD, insomnia.   Describes mood today as "ok". Pleasant. Mood symptoms - reports depression - "a lot". Feels like anxiety "comes and goes". Denies panic attacks. Not as quick tempered. Reports THC use. Decreased alcohol use. Discontinued Abilify - mood worsened - frustrated and arguing with his wife. Feels like the Remeron has been helpful for sleep. Willing to try Pristiq for the depression. He and wife doing well. Upcoming cruise. Decreased interest and motivation. Taking medication as prescribed. Energy levels lower. Does not have a regular exercise routine. Enjoys some usual interests and activities. Lives with wife, 4 cats and 1 dog. Wife's parents live in [place]. He lives close to family - grandparents 2 doors down. Spending  time with family. Appetite stable. Weight loss 195 to 185 pounds Sleeping much better. Averages 7 to 8 hours.  Focus and concentration "pretty bad". Completing tasks. Managing aspects of household. Works full-time. Denies SI or HI.  Denies AH or VH.  Previous medication trials: Celexa, Remeron, Concerta, Intuniv, Stratera, Wellbutrin   Review of Systems:  Review of Systems  Musculoskeletal:  Negative for gait problem.  Neurological:  Negative for tremors.  Psychiatric/Behavioral:         Please refer to HPI   Medications: I have reviewed the patient's current medications.  Current Outpatient Medications  Medication Sig Dispense Refill   desvenlafaxine (PRISTIQ) 50 MG 24 hr tablet Take 1 tablet (50 mg total) by mouth daily. 30 tablet 2   cetirizine (ZYRTEC ALLERGY) 10 MG tablet Take 1 tablet (10 mg total) by mouth daily. 30 tablet 0   fluticasone (FLONASE) 50 MCG/ACT nasal spray Place 1 spray into both nostrils daily for 14 days. 16 g 0   Methylphenidate HCl ER 54 MG TB24 Take 54 mg by mouth daily. 30 tablet 0   mirtazapine (REMERON) 15 MG tablet Take 1 tablet (15 mg total) by mouth at bedtime. 30 tablet 5   Multiple Vitamin (MULTIVITAMIN WITH MINERALS) TABS tablet Take 1 tablet by mouth daily.     omeprazole (PRILOSEC) 40 MG capsule TAKE 1 CAPSULE BY MOUTH ONCE DAILY IN THE MORNING 30 capsule 1   No current facility-administered medications for this visit.    Medication Side Effects: None  Allergies: No Known Allergies  Past Medical History:  Diagnosis Date  ADHD    Depression    GERD (gastroesophageal reflux disease)     Family History  Adopted: Yes  Problem Relation Age of Onset   Bipolar disorder Mother    Bipolar disorder Sister    Alcohol abuse Father     Social History   Socioeconomic History   Marital status: Single    Spouse name: Not on file   Number of children: Not on file   Years of education: Not on file   Highest education level: Not on file   Occupational History   Occupation: Personnel officer  Tobacco Use   Smoking status: Former    Years: 3.50    Types: Cigarettes, E-cigarettes   Smokeless tobacco: Never  Building services engineer Use: Every day  Substance and Sexual Activity   Alcohol use: No    Alcohol/week: 1.0 - 2.0 standard drink    Types: 1 - 2 Cans of beer per week    Comment: occ   Drug use: Yes    Types: Marijuana    Comment: occ   Sexual activity: Yes    Partners: Female    Birth control/protection: None  Other Topics Concern   Not on file  Social History Narrative   Not on file   Social Determinants of Health   Financial Resource Strain: Not on file  Food Insecurity: Not on file  Transportation Needs: Not on file  Physical Activity: Not on file  Stress: Not on file  Social Connections: Not on file  Intimate Partner Violence: Not on file    Past Medical History, Surgical history, Social history, and Family history were reviewed and updated as appropriate.   Please see review of systems for further details on the patient's review from today.   Objective:   Physical Exam:  There were no vitals taken for this visit.  Physical Exam Constitutional:      General: He is not in acute distress. Musculoskeletal:        General: No deformity.  Neurological:     Mental Status: He is alert and oriented to person, place, and time.     Coordination: Coordination normal.  Psychiatric:        Attention and Perception: Attention and perception normal. He does not perceive auditory or visual hallucinations.        Mood and Affect: Mood normal. Mood is not anxious or depressed. Affect is not labile, blunt, angry or inappropriate.        Speech: Speech normal.        Behavior: Behavior normal.        Thought Content: Thought content normal. Thought content is not paranoid or delusional. Thought content does not include homicidal or suicidal ideation. Thought content does not include homicidal or suicidal plan.         Cognition and Memory: Cognition and memory normal.        Judgment: Judgment normal.     Comments: Insight intact    Lab Review:     Component Value Date/Time   NA 141 03/14/2017 1350   K 5.3 (H) 03/14/2017 1350   CL 101 03/14/2017 1350   CO2 26 03/14/2017 1350   GLUCOSE 92 03/14/2017 1350   GLUCOSE 149 (H) 11/30/2010 2143   BUN 7 03/14/2017 1350   CREATININE 0.92 03/14/2017 1350   CALCIUM 9.3 03/14/2017 1350   PROT 7.0 03/14/2017 1350   ALBUMIN 4.7 03/14/2017 1350   AST 15 03/14/2017 1350   ALT 12 03/14/2017  1350   ALKPHOS 68 03/14/2017 1350   BILITOT 0.2 03/14/2017 1350   GFRNONAA 118 03/14/2017 1350   GFRAA 137 03/14/2017 1350       Component Value Date/Time   WBC 5.2 07/06/2017 1615   WBC 5.6 11/30/2010 2143   RBC 4.56 07/06/2017 1615   RBC 4.87 11/30/2010 2143   HGB 13.8 07/06/2017 1615   HCT 40.3 07/06/2017 1615   PLT 224 07/06/2017 1615   MCV 88 07/06/2017 1615   MCH 30.3 07/06/2017 1615   MCH 29.4 11/30/2010 2143   MCHC 34.2 07/06/2017 1615   MCHC 33.8 11/30/2010 2143   RDW 13.5 07/06/2017 1615   LYMPHSABS 1.8 07/06/2017 1615   MONOABS 0.4 11/30/2010 2143   EOSABS 0.2 07/06/2017 1615   BASOSABS 0.0 07/06/2017 1615    No results found for: POCLITH, LITHIUM   No results found for: PHENYTOIN, PHENOBARB, VALPROATE, CBMZ   .res Assessment: Plan:    Plan:  PDMP reviewed  1. D/C Abilify 5mg  daily 2. Continue Remeron 15mg  at bedtime 3. Add Pristiq 50mg  daily  RTC 4 weeks  Patient advised to contact office with any questions, adverse effects, or acute worsening in signs and symptoms.  Discussed potential metabolic side effects associated with atypical antipsychotics, as well as potential risk for movement side effects. Advised pt to contact office if movement side effects occur.    Diagnoses and all orders for this visit:  Generalized anxiety disorder -     desvenlafaxine (PRISTIQ) 50 MG 24 hr tablet; Take 1 tablet (50 mg total) by mouth  daily.  Insomnia, unspecified type -     desvenlafaxine (PRISTIQ) 50 MG 24 hr tablet; Take 1 tablet (50 mg total) by mouth daily.  Attention deficit hyperactivity disorder (ADHD), unspecified ADHD type  Bipolar I disorder (HCC)    Please see After Visit Summary for patient specific instructions.  No future appointments.  No orders of the defined types were placed in this encounter.     -------------------------------

## 2021-07-10 NOTE — Telephone Encounter (Signed)
Mr. jimel, myler are scheduled for a virtual visit with your provider today.    Just as we do with appointments in the office, we must obtain your consent to participate.  Your consent will be active for this visit and any virtual visit you may have with one of our providers in the next 365 days.    If you have a MyChart account, I can also send a copy of this consent to you electronically.  All virtual visits are billed to your insurance company just like a traditional visit in the office.  As this is a virtual visit, video technology does not allow for your provider to perform a traditional examination.  This may limit your provider's ability to fully assess your condition.  If your provider identifies any concerns that need to be evaluated in person or the need to arrange testing such as labs, EKG, etc, we will make arrangements to do so.    Although advances in technology are sophisticated, we cannot ensure that it will always work on either your end or our end.  If the connection with a video visit is poor, we may have to switch to a telephone visit.  With either a video or telephone visit, we are not always able to ensure that we have a secure connection.   I need to obtain your verbal consent now.   Are you willing to proceed with your visit today?   Romaldo Saville Roza has provided verbal consent on 07/10/2021 for a virtual visit (video or telephone).   Stevphen Meuse, [facility] 07/10/2021  8:53 AM

## 2021-07-16 ENCOUNTER — Telehealth: Payer: Self-pay | Admitting: Adult Health

## 2021-07-16 DIAGNOSIS — F489 Nonpsychotic mental disorder, unspecified: Secondary | ICD-10-CM

## 2021-07-16 NOTE — Progress Notes (Signed)
Patient no show appointment. ? ?

## 2021-07-26 ENCOUNTER — Telehealth (INDEPENDENT_AMBULATORY_CARE_PROVIDER_SITE_OTHER): Payer: 59 | Admitting: Adult Health

## 2021-07-26 ENCOUNTER — Encounter: Payer: Self-pay | Admitting: Adult Health

## 2021-07-26 DIAGNOSIS — F331 Major depressive disorder, recurrent, moderate: Secondary | ICD-10-CM

## 2021-07-26 DIAGNOSIS — G4700 Insomnia, unspecified: Secondary | ICD-10-CM | POA: Diagnosis not present

## 2021-07-26 DIAGNOSIS — F909 Attention-deficit hyperactivity disorder, unspecified type: Secondary | ICD-10-CM | POA: Diagnosis not present

## 2021-07-26 DIAGNOSIS — F411 Generalized anxiety disorder: Secondary | ICD-10-CM | POA: Diagnosis not present

## 2021-07-26 MED ORDER — DESVENLAFAXINE SUCCINATE ER 50 MG PO TB24
50.0000 mg | ORAL_TABLET | Freq: Every day | ORAL | 2 refills | Status: DC
Start: 2021-07-26 — End: 2021-08-23

## 2021-07-26 MED ORDER — QUETIAPINE FUMARATE 50 MG PO TABS: 50.0000 mg | ORAL_TABLET | Freq: Every day | ORAL | 2 refills | Status: DC

## 2021-07-26 NOTE — Progress Notes (Signed)
Jordan Christian [id number] May 24, 1996 25 y.o.  Virtual Visit via Video Note  I connected with pt @ on 07/26/21 at  9:00 AM EST by a video enabled telemedicine application and verified that I am speaking with the correct person using two identifiers.   I discussed the limitations of evaluation and management by telemedicine and the availability of in person appointments. The patient expressed understanding and agreed to proceed.  I discussed the assessment and treatment plan with the patient. The patient was provided an opportunity to ask questions and all were answered. The patient agreed with the plan and demonstrated an understanding of the instructions.   The patient was advised to call back or seek an in-person evaluation if the symptoms worsen or if the condition fails to improve as anticipated.  I provided 25 minutes of non-face-to-face time during this encounter.  The patient was located at home.  The provider was located at [facility].   Dorothyann Gibbs, NP   Subjective:   Patient ID:  Jordan Christian is a 25 y.o. (DOB 07-16-1996) male.  Chief Complaint: No chief complaint on file.   HPI Jordan Christian presents for follow-up of MDD, GAD, ADHD, insomnia.   Describes mood today as "ok". Pleasant. Mood symptoms - reports depression, anxiety, and irritability. Gets mad and frustrated - throws things. Reports panic attacks - "I get caught in a frenzy". Has not taken any medications over the last 6 weeks - stopped Remeron. Reports daily THC use. Reports alcohol use - a few times a week. He and wife doing well.Upcoming one year anniversary for he and wife. Decreased interest and motivation. Taking medication as prescribed. Energy levels lower. Does not have a regular exercise routine. Enjoys some usual interests and activities. Lives with wife, 4 cats and 1 dog. Wife's parents live in [place]. He lives close to family - grandparents 2 doors down. Spending time with  family. Appetite stable. Weight stable - 185 pounds Sleeping much better. Averages 2 to 3 hours - in bed and not sleeping.  Focus and concentration difficulties. Completing tasks. Managing aspects of household. Works full-time. Denies SI or HI.  Denies AH or VH.  Previous medication trials: Celexa, Remeron, Concerta, Intuniv, Stratera, Wellbutrin  Review of Systems:  Review of Systems  Musculoskeletal:  Negative for gait problem.  Neurological:  Negative for tremors.  Psychiatric/Behavioral:         Please refer to HPI   Medications: I have reviewed the patient's current medications.  Current Outpatient Medications  Medication Sig Dispense Refill   cetirizine (ZYRTEC ALLERGY) 10 MG tablet Take 1 tablet (10 mg total) by mouth daily. 30 tablet 0   desvenlafaxine (PRISTIQ) 50 MG 24 hr tablet Take 1 tablet (50 mg total) by mouth daily. 30 tablet 2   fluticasone (FLONASE) 50 MCG/ACT nasal spray Place 1 spray into both nostrils daily for 14 days. 16 g 0   Methylphenidate HCl ER 54 MG TB24 Take 54 mg by mouth daily. 30 tablet 0   mirtazapine (REMERON) 15 MG tablet Take 1 tablet (15 mg total) by mouth at bedtime. 30 tablet 5   Multiple Vitamin (MULTIVITAMIN WITH MINERALS) TABS tablet Take 1 tablet by mouth daily.     omeprazole (PRILOSEC) 40 MG capsule TAKE 1 CAPSULE BY MOUTH ONCE DAILY IN THE MORNING 30 capsule 1   No current facility-administered medications for this visit.    Medication Side Effects: None  Allergies: No Known Allergies  Past Medical History:  Diagnosis Date   ADHD    Depression    GERD (gastroesophageal reflux disease)     Family History  Adopted: Yes  Problem Relation Age of Onset   Bipolar disorder Mother    Bipolar disorder Sister    Alcohol abuse Father     Social History   Socioeconomic History   Marital status: Single    Spouse name: Not on file   Number of children: Not on file   Years of education: Not on file   Highest education level: Not  on file  Occupational History   Occupation: Personnel officer  Tobacco Use   Smoking status: Former    Years: 3.50    Types: Cigarettes, E-cigarettes   Smokeless tobacco: Never  Building services engineer Use: Every day  Substance and Sexual Activity   Alcohol use: No    Alcohol/week: 1.0 - 2.0 standard drink    Types: 1 - 2 Cans of beer per week    Comment: occ   Drug use: Yes    Types: Marijuana    Comment: occ   Sexual activity: Yes    Partners: Female    Birth control/protection: None  Other Topics Concern   Not on file  Social History Narrative   Not on file   Social Determinants of Health   Financial Resource Strain: Not on file  Food Insecurity: Not on file  Transportation Needs: Not on file  Physical Activity: Not on file  Stress: Not on file  Social Connections: Not on file  Intimate Partner Violence: Not on file    Past Medical History, Surgical history, Social history, and Family history were reviewed and updated as appropriate.   Please see review of systems for further details on the patient's review from today.   Objective:   Physical Exam:  There were no vitals taken for this visit.  Physical Exam Constitutional:      General: He is not in acute distress. Musculoskeletal:        General: No deformity.  Neurological:     Mental Status: He is alert and oriented to person, place, and time.     Coordination: Coordination normal.  Psychiatric:        Attention and Perception: Attention and perception normal. He does not perceive auditory or visual hallucinations.        Mood and Affect: Mood normal. Mood is not anxious or depressed. Affect is not labile, blunt, angry or inappropriate.        Speech: Speech normal.        Behavior: Behavior normal.        Thought Content: Thought content normal. Thought content is not paranoid or delusional. Thought content does not include homicidal or suicidal ideation. Thought content does not include homicidal or suicidal  plan.        Cognition and Memory: Cognition and memory normal.        Judgment: Judgment normal.     Comments: Insight intact    Lab Review:     Component Value Date/Time   NA 141 03/14/2017 1350   K 5.3 (H) 03/14/2017 1350   CL 101 03/14/2017 1350   CO2 26 03/14/2017 1350   GLUCOSE 92 03/14/2017 1350   GLUCOSE 149 (H) 11/30/2010 2143   BUN 7 03/14/2017 1350   CREATININE 0.92 03/14/2017 1350   CALCIUM 9.3 03/14/2017 1350   PROT 7.0 03/14/2017 1350   ALBUMIN 4.7 03/14/2017 1350   AST 15 03/14/2017 1350  ALT 12 03/14/2017 1350   ALKPHOS 68 03/14/2017 1350   BILITOT 0.2 03/14/2017 1350   GFRNONAA 118 03/14/2017 1350   GFRAA 137 03/14/2017 1350       Component Value Date/Time   WBC 5.2 07/06/2017 1615   WBC 5.6 11/30/2010 2143   RBC 4.56 07/06/2017 1615   RBC 4.87 11/30/2010 2143   HGB 13.8 07/06/2017 1615   HCT 40.3 07/06/2017 1615   PLT 224 07/06/2017 1615   MCV 88 07/06/2017 1615   MCH 30.3 07/06/2017 1615   MCH 29.4 11/30/2010 2143   MCHC 34.2 07/06/2017 1615   MCHC 33.8 11/30/2010 2143   RDW 13.5 07/06/2017 1615   LYMPHSABS 1.8 07/06/2017 1615   MONOABS 0.4 11/30/2010 2143   EOSABS 0.2 07/06/2017 1615   BASOSABS 0.0 07/06/2017 1615    No results found for: POCLITH, LITHIUM   No results found for: PHENYTOIN, PHENOBARB, VALPROATE, CBMZ   .res Assessment: Plan:    Plan:  PDMP reviewed  1. Add Pristiq 50mg  daily 2. Add Seroquel 50mg  at hs    RTC 4 weeks  Patient advised to contact office with any questions, adverse effects, or acute worsening in signs and symptoms.  Discussed potential metabolic side effects associated with atypical antipsychotics, as well as potential risk for movement side effects. Advised pt to contact office if movement side effects occur.   There are no diagnoses linked to this encounter.   Please see After Visit Summary for patient specific instructions.  No future appointments.  No orders of the defined types were  placed in this encounter.     -------------------------------

## 2021-08-13 ENCOUNTER — Telehealth: Payer: Self-pay | Admitting: Adult Health

## 2021-08-13 NOTE — Telephone Encounter (Signed)
Patient notified of recommendations. 

## 2021-08-13 NOTE — Telephone Encounter (Signed)
Could be the pristiq - have him stop it and see if symptoms resolve.

## 2021-08-13 NOTE — Telephone Encounter (Signed)
Please see phone note from patient and advise.

## 2021-08-13 NOTE — Telephone Encounter (Signed)
Next visit is 08/23/21. Jordan Christian is taking Pristiq and Seroquel and was started on these last visit. He states that he is having trouble keeping an erection while having relations with his wife. Is this a side effect of these medications? His phone number is [phone].

## 2021-08-23 ENCOUNTER — Encounter: Payer: Self-pay | Admitting: Adult Health

## 2021-08-23 ENCOUNTER — Telehealth (INDEPENDENT_AMBULATORY_CARE_PROVIDER_SITE_OTHER): Payer: 59 | Admitting: Adult Health

## 2021-08-23 DIAGNOSIS — F319 Bipolar disorder, unspecified: Secondary | ICD-10-CM

## 2021-08-23 DIAGNOSIS — G4700 Insomnia, unspecified: Secondary | ICD-10-CM

## 2021-08-23 DIAGNOSIS — F331 Major depressive disorder, recurrent, moderate: Secondary | ICD-10-CM

## 2021-08-23 DIAGNOSIS — F909 Attention-deficit hyperactivity disorder, unspecified type: Secondary | ICD-10-CM

## 2021-08-23 DIAGNOSIS — F411 Generalized anxiety disorder: Secondary | ICD-10-CM | POA: Diagnosis not present

## 2021-08-23 MED ORDER — QUETIAPINE FUMARATE 50 MG PO TABS: 50.0000 mg | ORAL_TABLET | Freq: Every day | ORAL | 2 refills | Status: DC

## 2021-08-23 MED ORDER — DIVALPROEX SODIUM 125 MG PO DR TAB: DELAYED_RELEASE_TABLET | ORAL | 2 refills | Status: DC

## 2021-08-23 NOTE — Progress Notes (Signed)
Jordan Christian [id number] 1995/09/11 25 y.o.  Subjective:   Patient ID:  Jordan Christian is a 25 y.o. (DOB [birth year]) male.  Chief Complaint: No chief complaint on file.   HPI Jordan Christian presents to the office today for follow-up of MDD, GAD, ADHD, Mood disorder, and insomnia.   Describes mood today as "ok". Pleasant. Mood symptoms - reports depression, anxiety, and irritability. Having mood swings - "comes in waves" - multiple times a day. Has felt more motivated to do things. Recently stopped Pristiq due to SSE. Denies panic attacks. Struggles with anger and controlling himself. He and wife doing well. Reports daily THC use. Reports decreased alcohol use - a few times a week. Decreased interest and motivation. Taking medication as prescribed. Energy levels improved. Has a regular exercise routine. Enjoys some usual interests and activities. Lives with wife, 4 cats and 1 dog. Wife's parents live in [place]. Spending time with family. Appetite stable. Weight stable - 185 pounds Sleeping much better with Seroquel. Focus and concentration difficulties. Completing tasks. Managing aspects of household. Works full-time. Denies SI or HI.  Denies AH or VH.  Previous medication trials: Celexa, Remeron, Concerta, Intuniv, Stratera, Wellbutrin    PHQ2-9    Flowsheet Row Office Visit from 03/12/2017 in [facility] at [facility] Total Score 0      Flowsheet Row ED from 04/18/2021 in [facility] RISK CATEGORY No Risk        Review of Systems:  Review of Systems  Musculoskeletal:  Negative for gait problem.  Neurological:  Negative for tremors.  Psychiatric/Behavioral:         Please refer to HPI   Medications: I have reviewed the patient's current medications.  Current Outpatient Medications  Medication Sig Dispense Refill   cetirizine (ZYRTEC ALLERGY) 10 MG tablet Take 1 tablet (10 mg total) by mouth daily. 30 tablet 0   desvenlafaxine  (PRISTIQ) 50 MG 24 hr tablet Take 1 tablet (50 mg total) by mouth daily. 30 tablet 2   fluticasone (FLONASE) 50 MCG/ACT nasal spray Place 1 spray into both nostrils daily for 14 days. 16 g 0   Methylphenidate HCl ER 54 MG TB24 Take 54 mg by mouth daily. 30 tablet 0   Multiple Vitamin (MULTIVITAMIN WITH MINERALS) TABS tablet Take 1 tablet by mouth daily.     omeprazole (PRILOSEC) 40 MG capsule TAKE 1 CAPSULE BY MOUTH ONCE DAILY IN THE MORNING 30 capsule 1   QUEtiapine (SEROQUEL) 50 MG tablet Take 1 tablet (50 mg total) by mouth at bedtime. 30 tablet 2   No current facility-administered medications for this visit.    Medication Side Effects: None  Allergies: No Known Allergies  Past Medical History:  Diagnosis Date   ADHD    Depression    GERD (gastroesophageal reflux disease)     Past Medical History, Surgical history, Social history, and Family history were reviewed and updated as appropriate.   Please see review of systems for further details on the patient's review from today.   Objective:   Physical Exam:  There were no vitals taken for this visit.  Physical Exam Constitutional:      General: He is not in acute distress. Musculoskeletal:        General: No deformity.  Neurological:     Mental Status: He is alert and oriented to person, place, and time.     Coordination: Coordination normal.  Psychiatric:  Attention and Perception: Attention and perception normal. He does not perceive auditory or visual hallucinations.        Mood and Affect: Mood normal. Mood is not anxious or depressed. Affect is not labile, blunt, angry or inappropriate.        Speech: Speech normal.        Behavior: Behavior normal.        Thought Content: Thought content normal. Thought content is not paranoid or delusional. Thought content does not include homicidal or suicidal ideation. Thought content does not include homicidal or suicidal plan.        Cognition and Memory: Cognition and  memory normal.        Judgment: Judgment normal.     Comments: Insight intact    Lab Review:     Component Value Date/Time   NA 141 03/14/2017 1350   K 5.3 (H) 03/14/2017 1350   CL 101 03/14/2017 1350   CO2 26 03/14/2017 1350   GLUCOSE 92 03/14/2017 1350   GLUCOSE 149 (H) 11/30/2010 2143   BUN 7 03/14/2017 1350   CREATININE 0.92 03/14/2017 1350   CALCIUM 9.3 03/14/2017 1350   PROT 7.0 03/14/2017 1350   ALBUMIN 4.7 03/14/2017 1350   AST 15 03/14/2017 1350   ALT 12 03/14/2017 1350   ALKPHOS 68 03/14/2017 1350   BILITOT 0.2 03/14/2017 1350   GFRNONAA 118 03/14/2017 1350   GFRAA 137 03/14/2017 1350       Component Value Date/Time   WBC 5.2 07/06/2017 1615   WBC 5.6 11/30/2010 2143   RBC 4.56 07/06/2017 1615   RBC 4.87 11/30/2010 2143   HGB 13.8 07/06/2017 1615   HCT 40.3 07/06/2017 1615   PLT 224 07/06/2017 1615   MCV 88 07/06/2017 1615   MCH 30.3 07/06/2017 1615   MCH 29.4 11/30/2010 2143   MCHC 34.2 07/06/2017 1615   MCHC 33.8 11/30/2010 2143   RDW 13.5 07/06/2017 1615   LYMPHSABS 1.8 07/06/2017 1615   MONOABS 0.4 11/30/2010 2143   EOSABS 0.2 07/06/2017 1615   BASOSABS 0.0 07/06/2017 1615    No results found for: POCLITH, LITHIUM   No results found for: PHENYTOIN, PHENOBARB, VALPROATE, CBMZ   .res Assessment: Plan:    Plan:  PDMP reviewed  1. D/C Pristiq 50mg  daily - SSE 2. Add Seroquel 50mg  at hs  3. Add Depakote 125mg  x 7 days, then 250mg  daily  RTC 4 weeks  Patient advised to contact office with any questions, adverse effects, or acute worsening in signs and symptoms.  Discussed potential metabolic side effects associated with atypical antipsychotics, as well as potential risk for movement side effects. Advised pt to contact office if movement side effects occur.   Discussed potential benefits, risks, and side effects of Depakote and need for period lab monitoring to assess for potential adverse effects to include obtaining CBC and LFTs.   There  are no diagnoses linked to this encounter.   Please see After Visit Summary for patient specific instructions.  No future appointments.  No orders of the defined types were placed in this encounter.   -------------------------------

## 2021-08-27 ENCOUNTER — Ambulatory Visit: Payer: Self-pay | Admitting: Physician Assistant

## 2021-09-18 ENCOUNTER — Telehealth: Payer: Self-pay | Admitting: Adult Health

## 2021-09-18 NOTE — Telephone Encounter (Signed)
Noted  

## 2021-09-18 NOTE — Telephone Encounter (Signed)
Jordan Christian wanted to change his apt to 3:20 PM instead of 10 am. When I called back I had to leave a voicemail, please try to reach him again.

## 2021-09-18 NOTE — Telephone Encounter (Signed)
Rtc to pt and he reports he has no impulse control, feels like he can do anything and deal with consequesnces later. This lead to him cheating on his wife last week. When asked about living arrangements he reports he's at home but he sleeps in his truck. Advised him that can't continue long term, he agreed. His wife is trying to figure out what she wants to do.  Pt reports he does feel improved with his mood since the Seroquel and Depakote but its the impulse control that he can't handle. When asked if he was suicidal he reports that his family did get all the guns out of the house 2 weeks ago. He reports the thoughts might come in waves.  His assignment for his job ended 2 weeks ago which lead to him having time to do the cheating.   Informed him I would update Rollene Fare and see what she recommends, informed him she would probably want him to come to his apt in the morning to discuss further but I would let him know.

## 2021-09-18 NOTE — Telephone Encounter (Signed)
Yes, keep appointment - in person preferably.

## 2021-09-18 NOTE — Telephone Encounter (Signed)
Pt notified to keep apt in the morning at 10 am, he agreed

## 2021-09-18 NOTE — Telephone Encounter (Signed)
Pt called reporting he is not doing good at all. Meds not working, marriage not working. Can't hold a job. Not sure if he needs to go to inpatient program or what to do.. Contact Pt @ [phone].  Last apt 08/23/21. Has a 20 mins apt 1/12 @ 10:00. Should he keep apt?

## 2021-09-19 ENCOUNTER — Ambulatory Visit (INDEPENDENT_AMBULATORY_CARE_PROVIDER_SITE_OTHER): Payer: Commercial Managed Care - PPO | Admitting: Adult Health

## 2021-09-19 ENCOUNTER — Encounter: Payer: Self-pay | Admitting: Adult Health

## 2021-09-19 ENCOUNTER — Other Ambulatory Visit: Payer: Self-pay

## 2021-09-19 DIAGNOSIS — F319 Bipolar disorder, unspecified: Secondary | ICD-10-CM

## 2021-09-19 DIAGNOSIS — F909 Attention-deficit hyperactivity disorder, unspecified type: Secondary | ICD-10-CM | POA: Diagnosis not present

## 2021-09-19 DIAGNOSIS — F331 Major depressive disorder, recurrent, moderate: Secondary | ICD-10-CM

## 2021-09-19 DIAGNOSIS — F411 Generalized anxiety disorder: Secondary | ICD-10-CM

## 2021-09-19 DIAGNOSIS — G4700 Insomnia, unspecified: Secondary | ICD-10-CM

## 2021-09-19 MED ORDER — QUETIAPINE FUMARATE 100 MG PO TABS: ORAL_TABLET | ORAL | 2 refills | Status: DC

## 2021-09-19 MED ORDER — DIVALPROEX SODIUM ER 500 MG PO TB24: ORAL_TABLET | ORAL | 2 refills | Status: DC

## 2021-09-19 NOTE — Telephone Encounter (Signed)
He's been rescheduled to 3:20 today

## 2021-09-19 NOTE — Progress Notes (Signed)
ARKEEM HARTS [id number] 1995/12/03 26 y.o.  Subjective:   Patient ID:  Jordan Christian is a 26 y.o. (DOB 12-Feb-1996) male.  Chief Complaint: No chief complaint on file.   HPI Jordan Christian presents to the office today for follow-up of MDD, GAD, ADHD, Mood disorder, and insomnia.  Accompanied by wife.  Describes mood today as "not very good". Pleasant. Mood symptoms - reports depression, anxiety, and irritability. Having mood swings - ups and downs. Making impulsive decisions. Denies panic attacks. Reports anger outbursts - related to losing things and feeling helpless. Stating "I'm not doing to good right now - "I keep hurting the people I care about". He and wife having issues. Reports daily THC use. Reports decreased alcohol use. Decreased interest and motivation. Taking medication as prescribed and feels they are helping.   Energy levels stable. Has a regular exercise routine. Enjoys some usual interests and activities. Lives with wife, 4 cats and 1 dog. Wife's parents live in [place]. Spending time with family. Appetite stable - 1 meal a day. Weight stable - 185 pounds. Sleep improved initially with Seroquel. Focus and concentration difficulties. Completing tasks. Managing aspects of household. Out of work currently.  Denies SI or HI.  Denies AH or VH.  Previous medication trials: Celexa, Remeron, Concerta, Intuniv, Stratera, Wellbutrin    PHQ2-9    Flowsheet Row Office Visit from 03/12/2017 in Primary Care at [facility] Total Score 0      Flowsheet Row ED from 04/18/2021 in [facility] at [facility] RISK CATEGORY No Risk        Review of Systems:  Review of Systems  Musculoskeletal:  Negative for gait problem.  Neurological:  Negative for tremors.  Psychiatric/Behavioral:         Please refer to HPI   Medications: I have reviewed the patient's current medications.  Current Outpatient Medications  Medication Sig Dispense Refill    divalproex (DEPAKOTE ER) 500 MG 24 hr tablet Take one tablet at bedtime, then take 1/2 tablet every morning for 7 days, then increase to one tablet twice daily. 60 tablet 2   cetirizine (ZYRTEC ALLERGY) 10 MG tablet Take 1 tablet (10 mg total) by mouth daily. 30 tablet 0   divalproex (DEPAKOTE) 125 MG DR tablet Take one tablet at bedtime x 7 nights, then take two tablets at bedtime. 60 tablet 2   fluticasone (FLONASE) 50 MCG/ACT nasal spray Place 1 spray into both nostrils daily for 14 days. 16 g 0   Methylphenidate HCl ER 54 MG TB24 Take 54 mg by mouth daily. 30 tablet 0   Multiple Vitamin (MULTIVITAMIN WITH MINERALS) TABS tablet Take 1 tablet by mouth daily.     omeprazole (PRILOSEC) 40 MG capsule TAKE 1 CAPSULE BY MOUTH ONCE DAILY IN THE MORNING 30 capsule 1   QUEtiapine (SEROQUEL) 100 MG tablet Take one tablet at bedtime for 3 nights, the increase to two tablets at night, then increase to three tablets at night. 90 tablet 2   No current facility-administered medications for this visit.    Medication Side Effects: None  Allergies: No Known Allergies  Past Medical History:  Diagnosis Date   ADHD    Depression    GERD (gastroesophageal reflux disease)     Past Medical History, Surgical history, Social history, and Family history were reviewed and updated as appropriate.   Please see review of systems for further details on the patient's review from today.   Objective:  Physical Exam:  There were no vitals taken for this visit.  Physical Exam  Lab Review:     Component Value Date/Time   NA 141 03/14/2017 1350   K 5.3 (H) 03/14/2017 1350   CL 101 03/14/2017 1350   CO2 26 03/14/2017 1350   GLUCOSE 92 03/14/2017 1350   GLUCOSE 149 (H) 11/30/2010 2143   BUN 7 03/14/2017 1350   CREATININE 0.92 03/14/2017 1350   CALCIUM 9.3 03/14/2017 1350   PROT 7.0 03/14/2017 1350   ALBUMIN 4.7 03/14/2017 1350   AST 15 03/14/2017 1350   ALT 12 03/14/2017 1350   ALKPHOS 68 03/14/2017  1350   BILITOT 0.2 03/14/2017 1350   GFRNONAA 118 03/14/2017 1350   GFRAA 137 03/14/2017 1350       Component Value Date/Time   WBC 5.2 07/06/2017 1615   WBC 5.6 11/30/2010 2143   RBC 4.56 07/06/2017 1615   RBC 4.87 11/30/2010 2143   HGB 13.8 07/06/2017 1615   HCT 40.3 07/06/2017 1615   PLT 224 07/06/2017 1615   MCV 88 07/06/2017 1615   MCH 30.3 07/06/2017 1615   MCH 29.4 11/30/2010 2143   MCHC 34.2 07/06/2017 1615   MCHC 33.8 11/30/2010 2143   RDW 13.5 07/06/2017 1615   LYMPHSABS 1.8 07/06/2017 1615   MONOABS 0.4 11/30/2010 2143   EOSABS 0.2 07/06/2017 1615   BASOSABS 0.0 07/06/2017 1615    No results found for: POCLITH, LITHIUM   No results found for: PHENYTOIN, PHENOBARB, VALPROATE, CBMZ   .res Assessment: Plan:    Plan:  PDMP reviewed  Increase Seroquel 50mg  to 100mg  at hs x 3, then increase to 200mg  at hs x 3 nights, then increase to 3 tablets at bedtime. Increase Depakote to 500mg  at hs, then add 1/2 tablet 250mg  every morning x 7 days, then increase to two tablets  daily.  RTC 2 weeks  Discussed [facility] as an option - information given  Patient advised to contact office with any questions, adverse effects, or acute worsening in signs and symptoms.  Discussed potential metabolic side effects associated with atypical antipsychotics, as well as potential risk for movement side effects. Advised pt to contact office if movement side effects occur.   Discussed potential benefits, risks, and side effects of Depakote and need for period lab monitoring to assess for potential adverse effects to include obtaining CBC and LFTs.   Diagnoses and all orders for this visit:  Bipolar I disorder (HCC)  Attention deficit hyperactivity disorder (ADHD), unspecified ADHD type -     QUEtiapine (SEROQUEL) 100 MG tablet; Take one tablet at bedtime for 3 nights, the increase to two tablets at night, then increase to three tablets at night. -     divalproex (DEPAKOTE ER) 500  MG 24 hr tablet; Take one tablet at bedtime, then take 1/2 tablet every morning for 7 days, then increase to one tablet twice daily.  Major depressive disorder, recurrent episode, moderate (HCC) -     QUEtiapine (SEROQUEL) 100 MG tablet; Take one tablet at bedtime for 3 nights, the increase to two tablets at night, then increase to three tablets at night. -     divalproex (DEPAKOTE ER) 500 MG 24 hr tablet; Take one tablet at bedtime, then take 1/2 tablet every morning for 7 days, then increase to one tablet twice daily.  Insomnia, unspecified type  Generalized anxiety disorder     Please see After Visit Summary for patient specific instructions.  Future Appointments  Date  Time Provider Department Center  10/02/2021  3:00 PM Natoria Archibald, Thereasa Solo, NP [facility] None    No orders of the defined types were placed in this encounter.   -------------------------------

## 2021-09-19 NOTE — Telephone Encounter (Signed)
Noted  

## 2021-09-25 ENCOUNTER — Telehealth: Payer: Self-pay | Admitting: Adult Health

## 2021-09-25 NOTE — Telephone Encounter (Signed)
Pt has been trying to get in with a counselor and [facility] can see him if he gets a referral for therapy. Can you write a short referral for him?

## 2021-09-26 NOTE — Telephone Encounter (Signed)
Referral letter completed

## 2021-09-26 NOTE — Telephone Encounter (Signed)
Pt notified and letter faxed 1/19

## 2021-10-01 ENCOUNTER — Other Ambulatory Visit: Payer: Self-pay

## 2021-10-01 ENCOUNTER — Encounter (HOSPITAL_COMMUNITY): Payer: Self-pay

## 2021-10-01 ENCOUNTER — Ambulatory Visit (INDEPENDENT_AMBULATORY_CARE_PROVIDER_SITE_OTHER): Payer: Self-pay | Admitting: Clinical

## 2021-10-01 DIAGNOSIS — F4310 Post-traumatic stress disorder, unspecified: Secondary | ICD-10-CM

## 2021-10-01 DIAGNOSIS — F331 Major depressive disorder, recurrent, moderate: Secondary | ICD-10-CM

## 2021-10-01 DIAGNOSIS — F319 Bipolar disorder, unspecified: Secondary | ICD-10-CM

## 2021-10-01 DIAGNOSIS — F419 Anxiety disorder, unspecified: Secondary | ICD-10-CM

## 2021-10-01 DIAGNOSIS — G4700 Insomnia, unspecified: Secondary | ICD-10-CM

## 2021-10-01 NOTE — Plan of Care (Signed)
Verbal Consent 
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
## 2021-10-02 ENCOUNTER — Telehealth: Payer: Self-pay | Admitting: Adult Health

## 2021-10-02 NOTE — Progress Notes (Addendum)
Patient no show appointment - video visit. Called and LVM to call and R/S appointment

## 2021-10-03 ENCOUNTER — Telehealth (INDEPENDENT_AMBULATORY_CARE_PROVIDER_SITE_OTHER): Payer: Commercial Managed Care - PPO | Admitting: Adult Health

## 2021-10-03 ENCOUNTER — Encounter: Payer: Self-pay | Admitting: Adult Health

## 2021-10-03 DIAGNOSIS — F319 Bipolar disorder, unspecified: Secondary | ICD-10-CM | POA: Diagnosis not present

## 2021-10-03 DIAGNOSIS — F411 Generalized anxiety disorder: Secondary | ICD-10-CM

## 2021-10-03 DIAGNOSIS — F331 Major depressive disorder, recurrent, moderate: Secondary | ICD-10-CM | POA: Diagnosis not present

## 2021-10-03 DIAGNOSIS — Z79899 Other long term (current) drug therapy: Secondary | ICD-10-CM

## 2021-10-03 DIAGNOSIS — F4310 Post-traumatic stress disorder, unspecified: Secondary | ICD-10-CM

## 2021-10-03 DIAGNOSIS — F909 Attention-deficit hyperactivity disorder, unspecified type: Secondary | ICD-10-CM | POA: Diagnosis not present

## 2021-10-03 DIAGNOSIS — G4700 Insomnia, unspecified: Secondary | ICD-10-CM | POA: Diagnosis not present

## 2021-10-03 MED ORDER — QUETIAPINE FUMARATE 300 MG PO TABS: ORAL_TABLET | ORAL | 2 refills | Status: DC

## 2021-10-03 MED ORDER — DIVALPROEX SODIUM ER 500 MG PO TB24: ORAL_TABLET | ORAL | 2 refills | Status: DC

## 2021-10-03 NOTE — Progress Notes (Signed)
EVON LOPEZPEREZ [id number] 04-May-1996 26 y.o.  Virtual Visit via Video Note  I connected with pt @ on 10/03/21 at  3:00 PM EST by a video enabled telemedicine application and verified that I am speaking with the correct person using two identifiers.   I discussed the limitations of evaluation and management by telemedicine and the availability of in person appointments. The patient expressed understanding and agreed to proceed.  I discussed the assessment and treatment plan with the patient. The patient was provided an opportunity to ask questions and all were answered. The patient agreed with the plan and demonstrated an understanding of the instructions.   The patient was advised to call back or seek an in-person evaluation if the symptoms worsen or if the condition fails to improve as anticipated.  I provided 25 minutes of non-face-to-face time during this encounter.  The patient was located at home.  The provider was located at [place].   Aloha Gell, NP   Subjective:   Patient ID:  Jordan Christian is a 26 y.o. (DOB 16-May-1996) male.  Chief Complaint: No chief complaint on file.   HPI Jordan Christian presents for follow-up of MDD, GAD, ADHD, Mood disorder, and insomnia.  Accompanied by wife.  Describes mood today as "not very good". Pleasant. Mood symptoms - reports decreased depression and anxiety. Decreased panic attacks - "they have settled down some". Still feeling irritable. Reports anger outbursts - "better, but still there". Mood feels more stable - reports some ups and downs - "not as bad". Decreased impulsivity. He and wife having issues - working on things. Reports daily THC use - "maybe less". Reports alcohol use - a few times a week. Decreased interest and motivation. Taking medication as prescribed and feels they are helping.   Energy levels "comes and goes". Active, does not have a regular exercise routine. Enjoys some usual interests and activities.  Lives with wife, 4 cats and 1 dog. Wife's parents live in [place]. Spending time with family. Appetite stable - 1 meal a day. Weight stable - 185 pounds. Sleeping well most nights. Averages 7 hours, then gets up for an hour or two, then naps for 3 more hours.  Focus and concentration "not very good". Has difficulties "sticking" with things. Completing tasks. Managing aspects of household. Out of work currently. Denies SI or HI.  Denies AH or VH. Has met with [facility] counselor and completed initial assessment.  Previous medication trials: Celexa, Remeron, Concerta, Intuniv, Stratera, Wellbutrin   Review of Systems:  Review of Systems  Musculoskeletal:  Negative for gait problem.  Neurological:  Negative for tremors.  Psychiatric/Behavioral:         Please refer to HPI   Medications: I have reviewed the patient's current medications.  Current Outpatient Medications  Medication Sig Dispense Refill   cetirizine (ZYRTEC ALLERGY) 10 MG tablet Take 1 tablet (10 mg total) by mouth daily. 30 tablet 0   divalproex (DEPAKOTE ER) 500 MG 24 hr tablet Take one tablet twice daily. 60 tablet 2   fluticasone (FLONASE) 50 MCG/ACT nasal spray Place 1 spray into both nostrils daily for 14 days. 16 g 0   Methylphenidate HCl ER 54 MG TB24 Take 54 mg by mouth daily. 30 tablet 0   Multiple Vitamin (MULTIVITAMIN WITH MINERALS) TABS tablet Take 1 tablet by mouth daily.     omeprazole (PRILOSEC) 40 MG capsule TAKE 1 CAPSULE BY MOUTH ONCE DAILY IN THE MORNING 30 capsule 1   QUEtiapine (  SEROQUEL) 300 MG tablet Take one tablet at bedtime. 30 tablet 2   No current facility-administered medications for this visit.    Medication Side Effects: None  Allergies: No Known Allergies  Past Medical History:  Diagnosis Date   ADHD    Depression    GERD (gastroesophageal reflux disease)     Family History  Adopted: Yes  Problem Relation Age of Onset   Bipolar disorder Mother    Bipolar disorder Sister     Alcohol abuse Father     Social History   Socioeconomic History   Marital status: Single    Spouse name: Not on file   Number of children: Not on file   Years of education: Not on file   Highest education level: Not on file  Occupational History   Occupation: Clinical biochemist  Tobacco Use   Smoking status: Former    Years: 3.50    Types: Cigarettes, E-cigarettes   Smokeless tobacco: Never  Scientific laboratory technician Use: Every day  Substance and Sexual Activity   Alcohol use: No    Alcohol/week: 1.0 - 2.0 standard drink    Types: 1 - 2 Cans of beer per week    Comment: occ   Drug use: Yes    Types: Marijuana    Comment: occ   Sexual activity: Yes    Partners: Female    Birth control/protection: None  Other Topics Concern   Not on file  Social History Narrative   Not on file   Social Determinants of Health   Financial Resource Strain: Not on file  Food Insecurity: Not on file  Transportation Needs: Not on file  Physical Activity: Not on file  Stress: Not on file  Social Connections: Not on file  Intimate Partner Violence: Not on file    Past Medical History, Surgical history, Social history, and Family history were reviewed and updated as appropriate.   Please see review of systems for further details on the patient's review from today.   Objective:   Physical Exam:  There were no vitals taken for this visit.  Physical Exam Constitutional:      General: He is not in acute distress. Musculoskeletal:        General: No deformity.  Neurological:     Mental Status: He is alert and oriented to person, place, and time.     Coordination: Coordination normal.  Psychiatric:        Attention and Perception: Attention and perception normal. He does not perceive auditory or visual hallucinations.        Mood and Affect: Mood normal. Mood is not anxious or depressed. Affect is not labile, blunt, angry or inappropriate.        Speech: Speech normal.        Behavior: Behavior  normal.        Thought Content: Thought content normal. Thought content is not paranoid or delusional. Thought content does not include homicidal or suicidal ideation. Thought content does not include homicidal or suicidal plan.        Cognition and Memory: Cognition and memory normal.        Judgment: Judgment normal.     Comments: Insight intact    Lab Review:     Component Value Date/Time   NA 141 03/14/2017 1350   K 5.3 (H) 03/14/2017 1350   CL 101 03/14/2017 1350   CO2 26 03/14/2017 1350   GLUCOSE 92 03/14/2017 1350   GLUCOSE 149 (H) 11/30/2010  2143   BUN 7 03/14/2017 1350   CREATININE 0.92 03/14/2017 1350   CALCIUM 9.3 03/14/2017 1350   PROT 7.0 03/14/2017 1350   ALBUMIN 4.7 03/14/2017 1350   AST 15 03/14/2017 1350   ALT 12 03/14/2017 1350   ALKPHOS 68 03/14/2017 1350   BILITOT 0.2 03/14/2017 1350   GFRNONAA 118 03/14/2017 1350   GFRAA 137 03/14/2017 1350       Component Value Date/Time   WBC 5.2 07/06/2017 1615   WBC 5.6 11/30/2010 2143   RBC 4.56 07/06/2017 1615   RBC 4.87 11/30/2010 2143   HGB 13.8 07/06/2017 1615   HCT 40.3 07/06/2017 1615   PLT 224 07/06/2017 1615   MCV 88 07/06/2017 1615   MCH 30.3 07/06/2017 1615   MCH 29.4 11/30/2010 2143   MCHC 34.2 07/06/2017 1615   MCHC 33.8 11/30/2010 2143   RDW 13.5 07/06/2017 1615   LYMPHSABS 1.8 07/06/2017 1615   MONOABS 0.4 11/30/2010 2143   EOSABS 0.2 07/06/2017 1615   BASOSABS 0.0 07/06/2017 1615    No results found for: POCLITH, LITHIUM   No results found for: PHENYTOIN, PHENOBARB, VALPROATE, CBMZ   .res Assessment: Plan:    Plan:  PDMP reviewed  Seroquel 346m at bedtime. Depakote to 503mBID  [facility]  Patient advised to contact office with any questions, adverse effects, or acute worsening in signs and symptoms.  Discussed potential metabolic side effects associated with atypical antipsychotics, as well as potential risk for movement side effects. Advised  pt to contact office if movement side effects occur.   Discussed potential benefits, risks, and side effects of Depakote and need for period lab monitoring to assess for potential adverse effects to include obtaining CBC and LFTs.   Diagnoses and all orders for this visit:  Bipolar I disorder ([facility] Attention deficit hyperactivity disorder (ADHD), unspecified ADHD type -     divalproex (DEPAKOTE ER) 500 MG 24 hr tablet; Take one tablet twice daily. -     QUEtiapine (SEROQUEL) 300 MG tablet; Take one tablet at bedtime.  Major depressive disorder, recurrent episode, moderate (HCC) -     divalproex (DEPAKOTE ER) 500 MG 24 hr tablet; Take one tablet twice daily. -     QUEtiapine (SEROQUEL) 300 MG tablet; Take one tablet at bedtime.  Insomnia, unspecified type  PTSD (post-traumatic stress disorder)  Generalized anxiety disorder  High risk medication use -     Valproic acid level -     CMP and Liver     Please see After Visit Summary for patient specific instructions.  Future Appointments  Date Time Provider [place]/04/2022 11:00 AM CaMaye Hides, LCSW [facility] None    Orders Placed This Encounter  Procedures   Valproic acid level   CMP and Liver      -------------------------------

## 2021-10-04 ENCOUNTER — Encounter (HOSPITAL_COMMUNITY): Payer: Self-pay

## 2021-10-17 ENCOUNTER — Ambulatory Visit (INDEPENDENT_AMBULATORY_CARE_PROVIDER_SITE_OTHER): Payer: Self-pay | Admitting: Clinical

## 2021-10-17 ENCOUNTER — Other Ambulatory Visit: Payer: Self-pay

## 2021-10-17 DIAGNOSIS — G4700 Insomnia, unspecified: Secondary | ICD-10-CM

## 2021-10-17 DIAGNOSIS — F419 Anxiety disorder, unspecified: Secondary | ICD-10-CM

## 2021-10-17 DIAGNOSIS — F331 Major depressive disorder, recurrent, moderate: Secondary | ICD-10-CM

## 2021-10-17 DIAGNOSIS — F319 Bipolar disorder, unspecified: Secondary | ICD-10-CM

## 2021-10-17 DIAGNOSIS — F4310 Post-traumatic stress disorder, unspecified: Secondary | ICD-10-CM

## 2021-10-17 NOTE — Progress Notes (Signed)
Virtual Visit via Video Note  I connected with Jordan Christian on 10/17/21 at  1:00 PM EST by a video enabled telemedicine application and verified that I am speaking with the correct person using two identifiers.  Location: Patient: Home Provider: Office   I discussed the limitations of evaluation and management by telemedicine and the availability of in person appointments. The patient expressed understanding and agreed to proceed.   THERAPIST PROGRESS NOTE   Session Time: 1:00 PM-1:40 PM   Participation Level: Active   Behavioral Response: CasualAlertAnxious   Type of Therapy: Individual Therapy   Treatment Goals addressed: Mood and Anxiety/PTSD/Bipolar/Insomnia   Interventions: CBT   Summary: Jordan Christian. Jordan Christian is a 26 y.o. male who presents with MDD with Anxiety/Insomnia/PTSD/ Bipolar Disorder.  The OPT therapist worked with the patient for his ongoing OPT treatment. The OPT therapist utilized Motivational Interviewing to assist in creating therapeutic repore. The patient in the session was engaged and work in collaboration giving feedback about his triggers and symptoms over the past few weeks including stressors around finances and not working this has created a lot of isolation for the patient in his home life.The OPT therapist utilized Cognitive Behavioral Therapy through cognitive restructuring as well as worked with the patient on coping strategies to assist in management of MH symptoms. The patient continues to try to utilize his distraction techniques/tools including workout routine including drawing, watching tv, and cleaning as a coping strategies. The patient spoke about and worked with th OPT therapist around his empowerment in taking back control in his cognitive process through getting employed and not being in his head as much due to being so isolated. There additionally has been conflict between the patient and his wife around intimacy.   Suicidal/Homicidal: Nowithout  intent/plan   Therapist Response: The OPT therapist worked with the patient for the patients scheduled session. The patient was engaged in his session and gave feedback in relation to triggers, symptoms, and behavior responses over the past few weeks. The OPT therapist worked with the patient utilizing an in session Cognitive Behavioral Therapy exercise. The patient was responsive in the session and verbalized, " I think my sexual behaviors have caused difficulty in our relationship due to my inappropriate behaviors".  The OPT therapist continued to work in session overviewing communication and working to get to a normalized healthy sexual pattern within a traditional monogamous relationship, the patient identified he feels in part his sexual desires are connected to being hypersexualized due to early childhood sexual trauma.. The patient noted willingness to continue to work on awareness of his decisions and choices and the future impact they may have on himself and others.. The OPT therapist will continue treatment work with the patient in his next scheduled session.   Plan: Return again in 2/3 weeks.   Diagnosis:      Axis I: PTSD,Major depressive disorder, recurrent episode, moderate with anxiety, Bi polar Disorder, Insomnia                           Axis II: No diagnosis     I discussed the assessment and treatment plan with the patient. The patient was provided an opportunity to ask questions and all were answered. The patient agreed with the plan and demonstrated an understanding of the instructions.   The patient was advised to call back or seek an in-person evaluation if the symptoms worsen or if the condition fails to improve as anticipated.  I provided 40 minutes of non-face-to-face time during this encounter.   Winfred Burn, LCSW   10/17/2021

## 2021-11-13 ENCOUNTER — Ambulatory Visit (HOSPITAL_COMMUNITY): Payer: Self-pay | Admitting: Clinical

## 2021-11-26 ENCOUNTER — Ambulatory Visit (INDEPENDENT_AMBULATORY_CARE_PROVIDER_SITE_OTHER): Payer: Self-pay | Admitting: Clinical

## 2021-11-26 ENCOUNTER — Other Ambulatory Visit: Payer: Self-pay

## 2021-11-26 DIAGNOSIS — F4310 Post-traumatic stress disorder, unspecified: Secondary | ICD-10-CM

## 2021-11-26 DIAGNOSIS — F319 Bipolar disorder, unspecified: Secondary | ICD-10-CM

## 2021-11-26 NOTE — Progress Notes (Signed)
Virtual Visit via Video Note ?  ?I connected with Jordan Christian on 11/26/21 at  1:00 PM EST by a video enabled telemedicine application and verified that I am speaking with the correct person using two identifiers. ?  ?Location: ?Patient: Home ?Provider: Office ?  ?I discussed the limitations of evaluation and management by telemedicine and the availability of in person appointments. The patient expressed understanding and agreed to proceed. ?  ?  ?THERAPIST PROGRESS NOTE ?  ?Session Time: 1:00 PM-1:30 PM ?  ?Participation Level: Active ?  ?Behavioral Response: CasualAlertAnxious ?  ?Type of Therapy: Individual Therapy ?  ?Treatment Goals addressed: Mood and Anxiety/PTSD/Bipolar/Insomnia ?  ?Interventions: CBT ?  ?Summary: Jordan Christian is a 26 y.o. male who presents with MDD with Anxiety/Insomnia/PTSD/ Bipolar Disorder.  The OPT therapist worked with the patient for his ongoing OPT treatment. The OPT therapist utilized Motivational Interviewing to assist in creating therapeutic repore. The patient in the session was engaged and work in collaboration giving feedback about his triggers and symptoms over the past few weeks including stressors around emotion management and interaction with his partner.The OPT therapist utilized Cognitive Behavioral Therapy through cognitive restructuring as well as worked with the patient on coping strategies to assist in management of MH symptoms. The patient worked in session with the [facility] therapist on an exercise to assist him in management of reactive behavior and emotion control t. The OPT therapist worked with the patient in review of coping strategies including drawing, watching tv, and cleaning. The patient spoke about and worked with th [facility] therapist around his empowerment in taking back control in his cognitive process and thought lens . There additionally has been conflict between the patient and his wife around intimacy. ?  ?Suicidal/Homicidal: Nowithout intent/plan ?   ?Therapist Response: The OPT therapist worked with the patient for the patients scheduled session. The patient was engaged in his session and gave feedback in relation to triggers, symptoms, and behavior responses over the past few weeks. The OPT therapist worked with the patient utilizing an in session Cognitive Behavioral Therapy exercise. The patient was responsive in the session and verbalized, " I think my sexual behaviors have caused difficulty in our relationship due to my inappropriate behaviors".  The OPT therapist continued to work in session overviewing communication and working to get to a normalized healthy sexual pattern within a traditional monogamous relationship, the patient identified he feels in part his sexual desires are connected to being hypersexualized due to early childhood sexual trauma.. The patient noted willingness to continue to work on awareness of his emotions and not to displace. The OPT therapist will continue treatment work with the patient in his next scheduled session. ?  ?Plan: Return again in 2/3 weeks. ?  ?Diagnosis:      Axis I: PTSD,Major depressive disorder, recurrent episode, moderate with anxiety, Bi polar Disorder, Insomnia ?  ?                        Axis II: No diagnosis ?  ?  ?I discussed the assessment and treatment plan with the patient. The patient was provided an opportunity to ask questions and all were answered. The patient agreed with the plan and demonstrated an understanding of the instructions. ?  ?The patient was advised to call back or seek an in-person evaluation if the symptoms worsen or if the condition fails to improve as anticipated. ?  ?I provided 30 minutes of non-face-to-face time  during this encounter. ?  ?Lennox Grumbles, LCSW ?  ?11/26/2021 ?

## 2021-12-23 ENCOUNTER — Encounter: Payer: Self-pay | Admitting: Adult Health

## 2021-12-23 ENCOUNTER — Ambulatory Visit (INDEPENDENT_AMBULATORY_CARE_PROVIDER_SITE_OTHER): Payer: Commercial Managed Care - PPO | Admitting: Adult Health

## 2021-12-23 DIAGNOSIS — F4310 Post-traumatic stress disorder, unspecified: Secondary | ICD-10-CM | POA: Diagnosis not present

## 2021-12-23 DIAGNOSIS — Z79899 Other long term (current) drug therapy: Secondary | ICD-10-CM | POA: Diagnosis not present

## 2021-12-23 DIAGNOSIS — F411 Generalized anxiety disorder: Secondary | ICD-10-CM

## 2021-12-23 DIAGNOSIS — F331 Major depressive disorder, recurrent, moderate: Secondary | ICD-10-CM

## 2021-12-23 DIAGNOSIS — F909 Attention-deficit hyperactivity disorder, unspecified type: Secondary | ICD-10-CM

## 2021-12-23 DIAGNOSIS — G4700 Insomnia, unspecified: Secondary | ICD-10-CM

## 2021-12-23 DIAGNOSIS — F319 Bipolar disorder, unspecified: Secondary | ICD-10-CM

## 2021-12-23 DIAGNOSIS — F419 Anxiety disorder, unspecified: Secondary | ICD-10-CM

## 2021-12-23 MED ORDER — QUETIAPINE FUMARATE 300 MG PO TABS: ORAL_TABLET | ORAL | 2 refills | Status: DC

## 2021-12-23 MED ORDER — DIVALPROEX SODIUM ER 500 MG PO TB24: ORAL_TABLET | ORAL | 2 refills | Status: DC

## 2021-12-23 NOTE — Progress Notes (Signed)
PETROS AHART ?[id number] ?1996-02-21 ?26 y.o. ? ?Subjective:  ? ?Patient ID:  Jordan Christian is a 26 y.o. (DOB 14-Sep-1995) male. ? ?Chief Complaint: No chief complaint on file. ? ? ?HPI ?Jordan Christian presents to the office today for follow-up of MDD, GAD, ADHD, Mood disorder, and insomnia. ? ?Describes mood today as "better". Pleasant. Mood symptoms - reports decreased depression - "I go through phases of it". Feels anxious all the time about something. Reports feeling irritable "quite often" - has had to remove himself from the situation. Decreased panic attacks. Reports a few night terrors. Having periods of mania - "nothing over the past month or two until recently". Turning the mania towards the gym - spending 2 hours 4 days a week. ?He and wife working on relationship. Reports THC use - every other day. Reports alcohol use - a few times a week. Improved interest and motivation. Taking medication as prescribed and feels they are helping.   ?Energy levels stable. Active, does not have a regular exercise routine. ?Enjoys some usual interests and activities. Lives with wife, 4 cats and 1 dog. Wife's parents live in [place]. Spending time with family. ?Appetite adequate. Weight gain - 205 to 210 pounds. ?Sleeps better some nights than others. Averages 4 to 6 hours. ?Focus and concentration "hit or miss". Gets focused on things, but can get "scatter brained". Has difficulties completing tasks. Managing aspects of household. Working for [employer] - 6 weeks. ?Denies SI or HI.  ?Denies AH or VH. ? ?Working with a therapist - Suzan Garibaldi. ? ?Previous medication trials: Celexa, Remeron, Concerta, Intuniv, Stratera, Wellbutrin ? ? ?PHQ2-9   ? ?Flowsheet Row Office Visit from 03/12/2017 in [facility] at [facility]  ?PHQ-2 Total Score 0  ? ?  ? ?Flowsheet Row ED from 04/18/2021 in [facility] [place]  ?C-SSRS RISK CATEGORY No Risk  ? ?  ?  ? ?Review of Systems:  ?Review of Systems  ?Musculoskeletal:   Negative for gait problem.  ?Neurological:  Negative for tremors.  ?Psychiatric/Behavioral:    ?     Please refer to HPI  ? ?Medications: I have reviewed the patient's current medications. ? ?Current Outpatient Medications  ?Medication Sig Dispense Refill  ? cetirizine (ZYRTEC ALLERGY) 10 MG tablet Take 1 tablet (10 mg total) by mouth daily. 30 tablet 0  ? divalproex (DEPAKOTE ER) 500 MG 24 hr tablet Take one tablet twice daily. 60 tablet 2  ? fluticasone (FLONASE) 50 MCG/ACT nasal spray Place 1 spray into both nostrils daily for 14 days. 16 g 0  ? Methylphenidate HCl ER 54 MG TB24 Take 54 mg by mouth daily. 30 tablet 0  ? Multiple Vitamin (MULTIVITAMIN WITH MINERALS) TABS tablet Take 1 tablet by mouth daily.    ? omeprazole (PRILOSEC) 40 MG capsule TAKE 1 CAPSULE BY MOUTH ONCE DAILY IN THE MORNING 30 capsule 1  ? QUEtiapine (SEROQUEL) 300 MG tablet Take one tablet at bedtime. 30 tablet 2  ? ?No current facility-administered medications for this visit.  ? ? ?Medication Side Effects: None ? ?Allergies: No Known Allergies ? ?Past Medical History:  ?Diagnosis Date  ? ADHD   ? Depression   ? GERD (gastroesophageal reflux disease)   ? ? ?Past Medical History, Surgical history, Social history, and Family history were reviewed and updated as appropriate.  ? ?Please see review of systems for further details on the patient's review from today.  ? ?Objective:  ? ?Physical Exam:  ?There  were no vitals taken for this visit. ? ?Physical Exam ?Constitutional:   ?   General: He is not in acute distress. ?Musculoskeletal:     ?   General: No deformity.  ?Neurological:  ?   Mental Status: He is alert and oriented to person, place, and time.  ?   Coordination: Coordination normal.  ?Psychiatric:     ?   Attention and Perception: Attention and perception normal. He does not perceive auditory or visual hallucinations.     ?   Mood and Affect: Mood normal. Mood is not anxious or depressed. Affect is not labile, blunt, angry or  inappropriate.     ?   Speech: Speech normal.     ?   Behavior: Behavior normal.     ?   Thought Content: Thought content normal. Thought content is not paranoid or delusional. Thought content does not include homicidal or suicidal ideation. Thought content does not include homicidal or suicidal plan.     ?   Cognition and Memory: Cognition and memory normal.     ?   Judgment: Judgment normal.  ?   Comments: Insight intact  ? ? ?Lab Review:  ?   ?Component Value Date/Time  ? NA 141 03/14/2017 1350  ? K 5.3 (H) 03/14/2017 1350  ? CL 101 03/14/2017 1350  ? CO2 26 03/14/2017 1350  ? GLUCOSE 92 03/14/2017 1350  ? GLUCOSE 149 (H) 11/30/2010 2143  ? BUN 7 03/14/2017 1350  ? CREATININE 0.92 03/14/2017 1350  ? CALCIUM 9.3 03/14/2017 1350  ? PROT 7.0 03/14/2017 1350  ? ALBUMIN 4.7 03/14/2017 1350  ? AST 15 03/14/2017 1350  ? ALT 12 03/14/2017 1350  ? ALKPHOS 68 03/14/2017 1350  ? BILITOT 0.2 03/14/2017 1350  ? GFRNONAA 118 03/14/2017 1350  ? GFRAA 137 03/14/2017 1350  ? ? ?   ?Component Value Date/Time  ? WBC 5.2 07/06/2017 1615  ? WBC 5.6 11/30/2010 2143  ? RBC 4.56 07/06/2017 1615  ? RBC 4.87 11/30/2010 2143  ? HGB 13.8 07/06/2017 1615  ? HCT 40.3 07/06/2017 1615  ? PLT 224 07/06/2017 1615  ? MCV 88 07/06/2017 1615  ? MCH 30.3 07/06/2017 1615  ? MCH 29.4 11/30/2010 2143  ? MCHC 34.2 07/06/2017 1615  ? MCHC 33.8 11/30/2010 2143  ? RDW 13.5 07/06/2017 1615  ? LYMPHSABS 1.8 07/06/2017 1615  ? MONOABS 0.4 11/30/2010 2143  ? EOSABS 0.2 07/06/2017 1615  ? BASOSABS 0.0 07/06/2017 1615  ? ? ?No results found for: POCLITH, LITHIUM  ? ?No results found for: PHENYTOIN, PHENOBARB, VALPROATE, CBMZ  ? ?.res ?Assessment: Plan:   ? ?Plan: ? ?PDMP reviewed ? ?Increase Seroquel 300mg  to 400mg  at bedtime. ?Depakote to 500mg  BID ? ?Lab slips given ? ?Continue therapy at [facility] - ? ?RTC 2/3 weeks ? ?Patient advised to contact office with any questions, adverse effects, or acute worsening in signs and symptoms. ? ?Discussed potential  metabolic side effects associated with atypical antipsychotics, as well as potential risk for movement side effects. Advised pt to contact office if movement side effects occur.  ? ?Discussed potential benefits, risks, and side effects of Depakote and need for period lab monitoring to assess for potential adverse effects to include obtaining CBC and LFTs.  ? ? ?Diagnoses and all orders for this visit: ? ?High risk medication use ?-     Valproic acid level ?-     CMP and Liver ? ?Attention deficit hyperactivity disorder (ADHD), unspecified ADHD  type ?-     QUEtiapine (SEROQUEL) 300 MG tablet; Take one tablet at bedtime. ?-     divalproex (DEPAKOTE ER) 500 MG 24 hr tablet; Take one tablet twice daily. ? ?Major depressive disorder, recurrent episode, moderate (HCC) ?-     QUEtiapine (SEROQUEL) 300 MG tablet; Take one tablet at bedtime. ?-     divalproex (DEPAKOTE ER) 500 MG 24 hr tablet; Take one tablet twice daily. ? ?PTSD (post-traumatic stress disorder) ? ?Insomnia, unspecified type ? ?Bipolar I disorder (HCC) ? ?Recurrent moderate major depressive disorder with anxiety (HCC) ? ?Generalized anxiety disorder ? ?  ? ?Please see After Visit Summary for patient specific instructions. ? ?Future Appointments  ?Date Time Provider Department Center  ?12/30/2021 11:00 AM Winfred Burnarter, Terry T, LCSW [facility] None  ?03/24/2022 11:20 AM Domique Clapper, Thereasa Soloegina Nattalie, NP [facility] None  ? ? ?Orders Placed This Encounter  ?Procedures  ? Valproic acid level  ? CMP and Liver  ? ? ?------------------------------- ?

## 2021-12-29 LAB — CMP AND LIVER
ALT: 20 IU/L (ref 0–44)
AST: 19 IU/L (ref 0–40)
Albumin: 5.1 g/dL (ref 4.1–5.2)
Alkaline Phosphatase: 67 IU/L (ref 44–121)
BUN: 16 mg/dL (ref 6–20)
Bilirubin Total: <0.2 mg/dL (ref 0.0–1.2)
Bilirubin, Direct: <0.1 mg/dL (ref 0.00–0.40)
CO2: 24 mmol/L (ref 20–29)
Calcium: 9.1 mg/dL (ref 8.7–10.2)
Chloride: 103 mmol/L (ref 96–106)
Creatinine, Ser: 0.93 mg/dL (ref 0.76–1.27)
Glucose: 69 mg/dL — ABNORMAL LOW (ref 70–99)
Potassium: 4.5 mmol/L (ref 3.5–5.2)
Sodium: 142 mmol/L (ref 134–144)
Total Protein: 7 g/dL (ref 6.0–8.5)
eGFR: 116 mL/min/{1.73_m2} (ref >59)

## 2021-12-29 LAB — VALPROIC ACID LEVEL: Valproic Acid Lvl: 46 ug/mL — ABNORMAL LOW (ref 50–100)

## 2021-12-30 ENCOUNTER — Ambulatory Visit (INDEPENDENT_AMBULATORY_CARE_PROVIDER_SITE_OTHER): Payer: Self-pay | Admitting: Clinical

## 2021-12-30 DIAGNOSIS — F4310 Post-traumatic stress disorder, unspecified: Secondary | ICD-10-CM

## 2021-12-30 DIAGNOSIS — F419 Anxiety disorder, unspecified: Secondary | ICD-10-CM

## 2021-12-30 DIAGNOSIS — F331 Major depressive disorder, recurrent, moderate: Secondary | ICD-10-CM

## 2021-12-30 DIAGNOSIS — F319 Bipolar disorder, unspecified: Secondary | ICD-10-CM

## 2021-12-30 DIAGNOSIS — G4700 Insomnia, unspecified: Secondary | ICD-10-CM

## 2021-12-30 NOTE — Plan of Care (Signed)
Verbal Consent 

## 2021-12-30 NOTE — Progress Notes (Signed)
?  ? Virtual Visit via Telephone Note ? ?I connected with Jordan Christian on 12/30/21 at 11:00 AM EDT by telephone and verified that I am speaking with the correct person using two identifiers. ? ?Location: ?Patient: Home ?Provider: Office ?  ?I discussed the limitations, risks, security and privacy concerns of performing an evaluation and management service by telephone and the availability of in person appointments. I also discussed with the patient that there may be a patient responsible charge related to this service. The patient expressed understanding and agreed to proceed. ? ? ?THERAPIST PROGRESS NOTE ?  ?Session Time: 11:00 AM-11:30 AM ?  ?Participation Level: Active ?  ?Behavioral Response: CasualAlertAnxious ?  ?Type of Therapy: Individual Therapy ?  ?Treatment Goals addressed: Mood and Anxiety/PTSD/Bipolar/Insomnia ?  ?Interventions: CBT ?  ?Summary: Jordan Christian is a 26 y.o. male who presents with MDD with Anxiety/Insomnia/PTSD/ Bipolar Disorder.  The OPT therapist worked with the patient for his ongoing OPT treatment. The OPT therapist utilized Motivational Interviewing to assist in creating therapeutic repore. The patient in the session was engaged and work in collaboration giving feedback about his triggers and symptoms over the past few weeks including stressors around emotion management and interaction with his partner.The OPT therapist utilized Cognitive Behavioral Therapy through cognitive restructuring as well as worked with the patient on coping strategies to assist in management of MH symptoms. The patient spoke about being more active recently and this helps with managing mood. The OPT therapist worked with the patient in review of coping strategies including drawing, watching tv, and cleaning. The patient spoke about and worked with th OPT therapist around his empowerment in taking back control in his cognitive process and thought lens . The patient identified his brothers wedding coming  up in about 2 weeks and identifies this as a potential trigger. The patient spoke about leisure coming up to watch a music band soon. The patient spoke about his med therapy and noted he feels this is currently helpful in management of his MH symptoms. ?  ?Suicidal/Homicidal: Nowithout intent/plan ?  ?Therapist Response: The OPT therapist worked with the patient for the patients scheduled session. The patient was engaged in his session and gave feedback in relation to triggers, symptoms, and behavior responses over the past few weeks. The OPT therapist worked with the patient utilizing an in session Cognitive Behavioral Therapy exercise. The patient was responsive in the session and verbalized, " I have been trying to use that tool of looking at a trigger and my reaction to it and making sure they are on the same level  and I think that has helped, and I have been more active and exercising and working".  The OPT therapist continued to work in session overviewing communication tools with his partner.The patient noted willingness to continue to work on awareness of his emotions and not to displace. The OPT therapist will continue treatment work with the patient in his next scheduled session. ?  ?Plan: Return again in 2/3 weeks. ?  ?Diagnosis:      Axis I: PTSD,Major depressive disorder, recurrent episode, moderate with anxiety, Bi polar Disorder, Insomnia ?  ?                        Axis II: No diagnosis ?  ?  ?I discussed the assessment and treatment plan with the patient. The patient was provided an opportunity to ask questions and all were answered. The patient agreed with  the plan and demonstrated an understanding of the instructions. ?  ?The patient was advised to call back or seek an in-person evaluation if the symptoms worsen or if the condition fails to improve as anticipated. ?  ?I provided 30 minutes of non-face-to-face time during this encounter. ?  ?Winfred Burn, LCSW ?  ?12/30/2021 ?

## 2022-03-04 ENCOUNTER — Other Ambulatory Visit: Payer: Self-pay

## 2022-03-04 ENCOUNTER — Telehealth: Payer: Self-pay | Admitting: Adult Health

## 2022-03-04 DIAGNOSIS — F909 Attention-deficit hyperactivity disorder, unspecified type: Secondary | ICD-10-CM

## 2022-03-04 DIAGNOSIS — F331 Major depressive disorder, recurrent, moderate: Secondary | ICD-10-CM

## 2022-03-04 MED ORDER — QUETIAPINE FUMARATE 400 MG PO TABS: ORAL_TABLET | ORAL | 0 refills | Status: DC

## 2022-03-24 ENCOUNTER — Ambulatory Visit (INDEPENDENT_AMBULATORY_CARE_PROVIDER_SITE_OTHER): Payer: Commercial Managed Care - PPO | Admitting: Adult Health

## 2022-03-24 ENCOUNTER — Encounter: Payer: Self-pay | Admitting: Adult Health

## 2022-03-24 DIAGNOSIS — F909 Attention-deficit hyperactivity disorder, unspecified type: Secondary | ICD-10-CM | POA: Diagnosis not present

## 2022-03-24 DIAGNOSIS — F331 Major depressive disorder, recurrent, moderate: Secondary | ICD-10-CM

## 2022-03-24 DIAGNOSIS — G4700 Insomnia, unspecified: Secondary | ICD-10-CM

## 2022-03-24 DIAGNOSIS — F4310 Post-traumatic stress disorder, unspecified: Secondary | ICD-10-CM

## 2022-03-24 DIAGNOSIS — F411 Generalized anxiety disorder: Secondary | ICD-10-CM

## 2022-03-24 DIAGNOSIS — F39 Unspecified mood [affective] disorder: Secondary | ICD-10-CM

## 2022-03-24 MED ORDER — QUETIAPINE FUMARATE 400 MG PO TABS: ORAL_TABLET | ORAL | 5 refills | Status: DC

## 2022-03-24 MED ORDER — DIVALPROEX SODIUM ER 500 MG PO TB24: ORAL_TABLET | ORAL | 5 refills | Status: DC

## 2022-03-24 MED ORDER — GUANFACINE HCL ER 1 MG PO TB24: 1.0000 mg | ORAL_TABLET | Freq: Every day | ORAL | 5 refills | Status: DC

## 2022-03-24 NOTE — Progress Notes (Signed)
Jordan Christian [id number] May 01, 1996 26 y.o.  Subjective:   Patient ID:  Jordan Christian is a 26 y.o. (DOB [birth year]) male.  Chief Complaint: No chief complaint on file.   HPI [name] presents to the office today for follow-up of MDD, GAD, ADHD, Mood disorder, and insomnia.  Describes mood today as "ok. Pleasant. Mood symptoms - reports decreased depression - "I go through episodes". Feels anxious and on the edge". More irritable overall. Gets frustrated easily. Denies panic attacks Denies recent  night terrors. Having periods of mania - "having bouts". Stating "I'm not doing ok most of the time and I don't know what it is". He and wife working on relationship. Reports THC use - daily. Reports alcohol use - on the weekends. Improved interest and motivation. Taking medication as prescribed and feels they are helping.   Energy levels lower towards the middle of the week. Active, has a regular exercise routine. Enjoys some usual interests and activities. Lives with wife, 4 cats and 1 dog. Wife's parents live in [place]. Spending time with family. Appetite adequate. Weight stable - 200 pounds. Sleeps better some nights than others. Averages 5 to 6 hours. Focus and concentration "a little bit all over the place". Completing tasks. Managing aspects of household. Working for [employer] - "doing ok on job". Denies SI or HI.  Denies AH or VH. Denies self harm. Smoking THC every day - quit for a while and restarted.  Working with a therapist - Suzan Garibaldi.  Previous medication trials: Celexa, Remeron, Concerta, Intuniv, Stratera, Wellbutrin   PHQ2-9    Flowsheet Row Office Visit from 03/12/2017 in Primary Care at [facility] Total Score 0      Flowsheet Row ED from 04/18/2021 in [facility] at [facility] RISK CATEGORY No Risk        Review of Systems:  Review of Systems  Musculoskeletal:  Negative for gait problem.  Neurological:  Negative for  tremors.  Psychiatric/Behavioral:         Please refer to HPI    Medications: I have reviewed the patient's current medications.  Current Outpatient Medications  Medication Sig Dispense Refill   guanFACINE (INTUNIV) 1 MG TB24 ER tablet Take 1 tablet (1 mg total) by mouth daily. 30 tablet 5   cetirizine (ZYRTEC ALLERGY) 10 MG tablet Take 1 tablet (10 mg total) by mouth daily. 30 tablet 0   divalproex (DEPAKOTE ER) 500 MG 24 hr tablet Take one tablet twice daily. 60 tablet 5   fluticasone (FLONASE) 50 MCG/ACT nasal spray Place 1 spray into both nostrils daily for 14 days. 16 g 0   Methylphenidate HCl ER 54 MG TB24 Take 54 mg by mouth daily. 30 tablet 0   Multiple Vitamin (MULTIVITAMIN WITH MINERALS) TABS tablet Take 1 tablet by mouth daily.     omeprazole (PRILOSEC) 40 MG capsule TAKE 1 CAPSULE BY MOUTH ONCE DAILY IN THE MORNING 30 capsule 1   QUEtiapine (SEROQUEL) 400 MG tablet Take one tablet at bedtime. 30 tablet 5   No current facility-administered medications for this visit.    Medication Side Effects: None  Allergies: No Known Allergies  Past Medical History:  Diagnosis Date   ADHD    Depression    GERD (gastroesophageal reflux disease)     Past Medical History, Surgical history, Social history, and Family history were reviewed and updated as appropriate.   Please see review of systems for further details on the  patient's review from today.   Objective:   Physical Exam:  There were no vitals taken for this visit.  Physical Exam Constitutional:      General: He is not in acute distress. Musculoskeletal:        General: No deformity.  Neurological:     Mental Status: He is alert and oriented to person, place, and time.     Coordination: Coordination normal.  Psychiatric:        Attention and Perception: Attention and perception normal. He does not perceive auditory or visual hallucinations.        Mood and Affect: Mood normal. Mood is not anxious or depressed.  Affect is not labile, blunt, angry or inappropriate.        Speech: Speech normal.        Behavior: Behavior normal.        Thought Content: Thought content normal. Thought content is not paranoid or delusional. Thought content does not include homicidal or suicidal ideation. Thought content does not include homicidal or suicidal plan.        Cognition and Memory: Cognition and memory normal.        Judgment: Judgment normal.     Comments: Insight intact     Lab Review:     Component Value Date/Time   NA 142 12/27/2021 1423   K 4.5 12/27/2021 1423   CL 103 12/27/2021 1423   CO2 24 12/27/2021 1423   GLUCOSE 69 (L) 12/27/2021 1423   GLUCOSE 149 (H) 11/30/2010 2143   BUN 16 12/27/2021 1423   CREATININE 0.93 12/27/2021 1423   CALCIUM 9.1 12/27/2021 1423   PROT 7.0 12/27/2021 1423   ALBUMIN 5.1 12/27/2021 1423   AST 19 12/27/2021 1423   ALT 20 12/27/2021 1423   ALKPHOS 67 12/27/2021 1423   BILITOT <0.2 12/27/2021 1423   GFRNONAA 118 03/14/2017 1350   GFRAA 137 03/14/2017 1350       Component Value Date/Time   WBC 5.2 07/06/2017 1615   WBC 5.6 11/30/2010 2143   RBC 4.56 07/06/2017 1615   RBC 4.87 11/30/2010 2143   HGB 13.8 07/06/2017 1615   HCT 40.3 07/06/2017 1615   PLT 224 07/06/2017 1615   MCV 88 07/06/2017 1615   MCH 30.3 07/06/2017 1615   MCH 29.4 11/30/2010 2143   MCHC 34.2 07/06/2017 1615   MCHC 33.8 11/30/2010 2143   RDW 13.5 07/06/2017 1615   LYMPHSABS 1.8 07/06/2017 1615   MONOABS 0.4 11/30/2010 2143   EOSABS 0.2 07/06/2017 1615   BASOSABS 0.0 07/06/2017 1615    No results found for: "POCLITH", "LITHIUM"   Lab Results  Component Value Date   VALPROATE 46 (L) 12/27/2021     .res Assessment: Plan:    Plan:  PDMP reviewed  Add Intuniv 1mg  at hs Seroquel 400mg  at bedtime. Depakote to 500mg  BID  Lab slips given  Continue therapy at [facility] -  RTC 4 weeks  Patient advised to contact office with any questions, adverse effects, or acute  worsening in signs and symptoms.  Discussed potential metabolic side effects associated with atypical antipsychotics, as well as potential risk for movement side effects. Advised pt to contact office if movement side effects occur.   Discussed potential benefits, risks, and side effects of Depakote and need for period lab monitoring to assess for potential adverse effects to include obtaining CBC and LFTs.    Diagnoses and all orders for this visit:  PTSD (post-traumatic stress disorder)  Attention  deficit hyperactivity disorder (ADHD), unspecified ADHD type -     QUEtiapine (SEROQUEL) 400 MG tablet; Take one tablet at bedtime. -     divalproex (DEPAKOTE ER) 500 MG 24 hr tablet; Take one tablet twice daily. -     guanFACINE (INTUNIV) 1 MG TB24 ER tablet; Take 1 tablet (1 mg total) by mouth daily.  Major depressive disorder, recurrent episode, moderate (HCC) -     QUEtiapine (SEROQUEL) 400 MG tablet; Take one tablet at bedtime. -     divalproex (DEPAKOTE ER) 500 MG 24 hr tablet; Take one tablet twice daily.  Insomnia, unspecified type  Generalized anxiety disorder  Episodic mood disorder ([place])     Please see After Visit Summary for patient specific instructions.  No future appointments.  No orders of the defined types were placed in this encounter.   -------------------------------

## 2022-04-21 ENCOUNTER — Ambulatory Visit (INDEPENDENT_AMBULATORY_CARE_PROVIDER_SITE_OTHER): Payer: Commercial Managed Care - PPO | Admitting: Adult Health

## 2022-04-21 ENCOUNTER — Encounter: Payer: Self-pay | Admitting: Adult Health

## 2022-04-21 DIAGNOSIS — F331 Major depressive disorder, recurrent, moderate: Secondary | ICD-10-CM | POA: Diagnosis not present

## 2022-04-21 DIAGNOSIS — F411 Generalized anxiety disorder: Secondary | ICD-10-CM

## 2022-04-21 DIAGNOSIS — G4700 Insomnia, unspecified: Secondary | ICD-10-CM

## 2022-04-21 DIAGNOSIS — F4310 Post-traumatic stress disorder, unspecified: Secondary | ICD-10-CM | POA: Diagnosis not present

## 2022-04-21 DIAGNOSIS — F909 Attention-deficit hyperactivity disorder, unspecified type: Secondary | ICD-10-CM

## 2022-04-21 DIAGNOSIS — F39 Unspecified mood [affective] disorder: Secondary | ICD-10-CM

## 2022-04-21 MED ORDER — GUANFACINE HCL ER 2 MG PO TB24: 2.0000 mg | ORAL_TABLET | Freq: Every day | ORAL | 5 refills | Status: DC

## 2022-04-21 NOTE — Progress Notes (Signed)
Jordan Christian [id number] 11-Jun-1996 26 y.o.  Subjective:   Patient ID:  Jordan Christian is a 26 y.o. (DOB [birth year]) male.  Chief Complaint: No chief complaint on file.   HPI Jordan Christian presents to the office today for follow-up of MDD, GAD, ADHD, Mood disorder and insomnia.  Describes mood today as "ok. Pleasant. Increased periods of crying. Mood symptoms - reports decreased depression - "back and forth". Feels anxious at times. Feels irritable "a lot of the time". Easily frustrated - "misplacing things" - "things not how they are supposed to be". Denies panic attacks. Decreased night terrors - "none lately". Having periods of mania - "not in the past month". Stating "sometimes I think I'm doing better and other days not so much". Taking medications as prescribed. He and wife working on relationship. Reports THC use - daily. Reports alcohol use - on the weekends. Varying interest and motivation. Taking medication as prescribed and feels they are helping.   Energy levels lower - "I stay tied". Active, hasn't been consistent with exercise routine. Enjoys some usual interests and activities. Lives with wife, 4 cats and 1 dog. Wife's parents live in [place]. Spending time with family. Appetite adequate. Weight stable - 200 pounds. Sleeps better some nights than others. Averages 4 to 7 hours during the week and sleeps in on the weekends. Focus and concentration varies - does better when he works alone. Completing tasks. Managing aspects of household. Working for [employer]. Denies SI or HI.  Denies AH or VH. Denies self harm. Smoking THC every day. Drinking alcohol a few times a week.  Working with a therapist - Suzan Garibaldi - has not seen recently.  Previous medication trials: Celexa, Remeron, Concerta, Intuniv, Stratera, Wellbutrin   PHQ2-9    Flowsheet Row Office Visit from 03/12/2017 in Primary Care at [facility] Total Score 0      Flowsheet Row ED from 04/18/2021 in [facility] Urgent Care at [facility] RISK CATEGORY No Risk        Review of Systems:  Review of Systems  Musculoskeletal:  Negative for gait problem.  Neurological:  Negative for tremors.  Psychiatric/Behavioral:         Please refer to HPI    Medications: I have reviewed the patient's current medications.  Current Outpatient Medications  Medication Sig Dispense Refill   cetirizine (ZYRTEC ALLERGY) 10 MG tablet Take 1 tablet (10 mg total) by mouth daily. 30 tablet 0   divalproex (DEPAKOTE ER) 500 MG 24 hr tablet Take one tablet twice daily. 60 tablet 5   fluticasone (FLONASE) 50 MCG/ACT nasal spray Place 1 spray into both nostrils daily for 14 days. 16 g 0   guanFACINE (INTUNIV) 2 MG TB24 ER tablet Take 1 tablet (2 mg total) by mouth daily. 30 tablet 5   Multiple Vitamin (MULTIVITAMIN WITH MINERALS) TABS tablet Take 1 tablet by mouth daily.     omeprazole (PRILOSEC) 40 MG capsule TAKE 1 CAPSULE BY MOUTH ONCE DAILY IN THE MORNING 30 capsule 1   QUEtiapine (SEROQUEL) 400 MG tablet Take one tablet at bedtime. 30 tablet 5   No current facility-administered medications for this visit.    Medication Side Effects: None  Allergies: No Known Allergies  Past Medical History:  Diagnosis Date   ADHD    Depression    GERD (gastroesophageal reflux disease)     Past Medical History, Surgical history, Social history, and Family history were reviewed and updated as  appropriate.   Please see review of systems for further details on the patient's review from today.   Objective:   Physical Exam:  There were no vitals taken for this visit.  Physical Exam Constitutional:      General: He is not in acute distress. Musculoskeletal:        General: No deformity.  Neurological:     Mental Status: He is alert and oriented to person, place, and time.     Coordination: Coordination normal.  Psychiatric:        Attention and Perception: Attention and perception normal. He does not  perceive auditory or visual hallucinations.        Mood and Affect: Mood normal. Mood is not anxious or depressed. Affect is not labile, blunt, angry or inappropriate.        Speech: Speech normal.        Behavior: Behavior normal.        Thought Content: Thought content normal. Thought content is not paranoid or delusional. Thought content does not include homicidal or suicidal ideation. Thought content does not include homicidal or suicidal plan.        Cognition and Memory: Cognition and memory normal.        Judgment: Judgment normal.     Comments: Insight intact     Lab Review:     Component Value Date/Time   NA 142 12/27/2021 1423   K 4.5 12/27/2021 1423   CL 103 12/27/2021 1423   CO2 24 12/27/2021 1423   GLUCOSE 69 (L) 12/27/2021 1423   GLUCOSE 149 (H) 11/30/2010 2143   BUN 16 12/27/2021 1423   CREATININE 0.93 12/27/2021 1423   CALCIUM 9.1 12/27/2021 1423   PROT 7.0 12/27/2021 1423   ALBUMIN 5.1 12/27/2021 1423   AST 19 12/27/2021 1423   ALT 20 12/27/2021 1423   ALKPHOS 67 12/27/2021 1423   BILITOT <0.2 12/27/2021 1423   GFRNONAA 118 03/14/2017 1350   GFRAA 137 03/14/2017 1350       Component Value Date/Time   WBC 5.2 07/06/2017 1615   WBC 5.6 11/30/2010 2143   RBC 4.56 07/06/2017 1615   RBC 4.87 11/30/2010 2143   HGB 13.8 07/06/2017 1615   HCT 40.3 07/06/2017 1615   PLT 224 07/06/2017 1615   MCV 88 07/06/2017 1615   MCH 30.3 07/06/2017 1615   MCH 29.4 11/30/2010 2143   MCHC 34.2 07/06/2017 1615   MCHC 33.8 11/30/2010 2143   RDW 13.5 07/06/2017 1615   LYMPHSABS 1.8 07/06/2017 1615   MONOABS 0.4 11/30/2010 2143   EOSABS 0.2 07/06/2017 1615   BASOSABS 0.0 07/06/2017 1615    No results found for: "POCLITH", "LITHIUM"   Lab Results  Component Value Date   VALPROATE 46 (L) 12/27/2021     .res Assessment: Plan:    Plan:  PDMP reviewed  Increase Intuniv 1mg  to 2mg  at hs Seroquel 400mg  at bedtime. Depakote to 500mg  BI  RTC 4 weeks  Patient  advised to contact office with any questions, adverse effects, or acute worsening in signs and symptoms.  Discussed potential metabolic side effects associated with atypical antipsychotics, as well as potential risk for movement side effects. Advised pt to contact office if movement side effects occur.   Discussed potential benefits, risks, and side effects of Depakote and need for period lab monitoring to assess for potential adverse effects to include obtaining CBC and LFTs.  Diagnoses and all orders for this visit:  Major depressive disorder, recurrent episode,  moderate (HCC)  Attention deficit hyperactivity disorder (ADHD), unspecified ADHD type -     guanFACINE (INTUNIV) 2 MG TB24 ER tablet; Take 1 tablet (2 mg total) by mouth daily.  PTSD (post-traumatic stress disorder)  Insomnia, unspecified type  Generalized anxiety disorder  Episodic mood disorder (HCC)     Please see After Visit Summary for patient specific instructions.  No future appointments.  No orders of the defined types were placed in this encounter.   -------------------------------

## 2022-05-26 ENCOUNTER — Ambulatory Visit (INDEPENDENT_AMBULATORY_CARE_PROVIDER_SITE_OTHER): Payer: Commercial Managed Care - PPO | Admitting: Adult Health

## 2022-05-26 ENCOUNTER — Encounter: Payer: Self-pay | Admitting: Adult Health

## 2022-05-26 DIAGNOSIS — F39 Unspecified mood [affective] disorder: Secondary | ICD-10-CM

## 2022-05-26 DIAGNOSIS — F4310 Post-traumatic stress disorder, unspecified: Secondary | ICD-10-CM | POA: Diagnosis not present

## 2022-05-26 DIAGNOSIS — F331 Major depressive disorder, recurrent, moderate: Secondary | ICD-10-CM

## 2022-05-26 DIAGNOSIS — F411 Generalized anxiety disorder: Secondary | ICD-10-CM

## 2022-05-26 DIAGNOSIS — F909 Attention-deficit hyperactivity disorder, unspecified type: Secondary | ICD-10-CM | POA: Diagnosis not present

## 2022-05-26 DIAGNOSIS — G4700 Insomnia, unspecified: Secondary | ICD-10-CM

## 2022-05-26 NOTE — Progress Notes (Signed)
Jordan Christian [id number] [birth year] 26 y.o.  Subjective:   Patient ID:  Jordan Christian is a 26 y.o. (DOB 03-19-1996) male.  Chief Complaint: No chief complaint on file.   HPI Jordan Christian presents to the office today for follow-up of  MDD, GAD, ADHD, Mood disorder and insomnia.  Describes mood today as "ok". Pleasant. Reports a few periods of tearfulness. Mood symptoms - reports depression at times - "I'm able to run away from it". Feels anxious sometimes. Decreased irritability - "started working out again". Denies panic attacks. Decreased night terrors - "none recently". Having periods of mania - "I guess at times - once over past month or two". Stating "I feel like I'm doing ok". Taking medications as prescribed. He and wife working on relationship. Varying interest and motivation. Taking medication as prescribed and feels they are helping.   Energy levels improved. Active, restarted a regular exercise routine. Enjoys some usual interests and activities. Lives with wife, 4 cats and 1 dog. Wife's parents live in [place]. Spending time with family. Appetite adequate. Weight loss - 190 from 200 pounds. Sleeps better some nights than others. Averages 4 to 7 hours during the week and sleeps in on the weekends. Focus and concentration varies - some days are better than others. Completing tasks. Managing aspects of household. Working for [employer]. Denies SI or HI.  Denies AH or VH. Denies self harm. Smoking THC every day. Drinking alcohol once or twice a week.  Working with a therapist - Suzan Garibaldi - has not seen recently.  Previous medication trials: Celexa, Remeron, Concerta, Intuniv, Stratera, Wellbutrin   PHQ2-9    Flowsheet Row Office Visit from 03/12/2017 in Primary Care at [facility] Total Score 0      Flowsheet Row ED from 04/18/2021 in [facility] at [facility] RISK CATEGORY No Risk        Review of Systems:  Review of Systems   Musculoskeletal:  Negative for gait problem.  Neurological:  Negative for tremors.  Psychiatric/Behavioral:         Please refer to HPI    Medications: I have reviewed the patient's current medications.  Current Outpatient Medications  Medication Sig Dispense Refill   cetirizine (ZYRTEC ALLERGY) 10 MG tablet Take 1 tablet (10 mg total) by mouth daily. 30 tablet 0   divalproex (DEPAKOTE ER) 500 MG 24 hr tablet Take one tablet twice daily. 60 tablet 5   fluticasone (FLONASE) 50 MCG/ACT nasal spray Place 1 spray into both nostrils daily for 14 days. 16 g 0   guanFACINE (INTUNIV) 2 MG TB24 ER tablet Take 1 tablet (2 mg total) by mouth daily. 30 tablet 5   Multiple Vitamin (MULTIVITAMIN WITH MINERALS) TABS tablet Take 1 tablet by mouth daily.     omeprazole (PRILOSEC) 40 MG capsule TAKE 1 CAPSULE BY MOUTH ONCE DAILY IN THE MORNING 30 capsule 1   QUEtiapine (SEROQUEL) 400 MG tablet Take one tablet at bedtime. 30 tablet 5   No current facility-administered medications for this visit.    Medication Side Effects: None  Allergies: No Known Allergies  Past Medical History:  Diagnosis Date   ADHD    Depression    GERD (gastroesophageal reflux disease)     Past Medical History, Surgical history, Social history, and Family history were reviewed and updated as appropriate.   Please see review of systems for further details on the patient's review from today.   Objective:  Physical Exam:  There were no vitals taken for this visit.  Physical Exam Constitutional:      General: He is not in acute distress. Musculoskeletal:        General: No deformity.  Neurological:     Mental Status: He is alert and oriented to person, place, and time.     Coordination: Coordination normal.  Psychiatric:        Attention and Perception: Attention and perception normal. He does not perceive auditory or visual hallucinations.        Mood and Affect: Mood normal. Mood is not anxious or depressed.  Affect is not labile, blunt, angry or inappropriate.        Speech: Speech normal.        Behavior: Behavior normal.        Thought Content: Thought content normal. Thought content is not paranoid or delusional. Thought content does not include homicidal or suicidal ideation. Thought content does not include homicidal or suicidal plan.        Cognition and Memory: Cognition and memory normal.        Judgment: Judgment normal.     Comments: Insight intact     Lab Review:     Component Value Date/Time   NA 142 12/27/2021 1423   K 4.5 12/27/2021 1423   CL 103 12/27/2021 1423   CO2 24 12/27/2021 1423   GLUCOSE 69 (L) 12/27/2021 1423   GLUCOSE 149 (H) 11/30/2010 2143   BUN 16 12/27/2021 1423   CREATININE 0.93 12/27/2021 1423   CALCIUM 9.1 12/27/2021 1423   PROT 7.0 12/27/2021 1423   ALBUMIN 5.1 12/27/2021 1423   AST 19 12/27/2021 1423   ALT 20 12/27/2021 1423   ALKPHOS 67 12/27/2021 1423   BILITOT <0.2 12/27/2021 1423   GFRNONAA 118 03/14/2017 1350   GFRAA 137 03/14/2017 1350       Component Value Date/Time   WBC 5.2 07/06/2017 1615   WBC 5.6 11/30/2010 2143   RBC 4.56 07/06/2017 1615   RBC 4.87 11/30/2010 2143   HGB 13.8 07/06/2017 1615   HCT 40.3 07/06/2017 1615   PLT 224 07/06/2017 1615   MCV 88 07/06/2017 1615   MCH 30.3 07/06/2017 1615   MCH 29.4 11/30/2010 2143   MCHC 34.2 07/06/2017 1615   MCHC 33.8 11/30/2010 2143   RDW 13.5 07/06/2017 1615   LYMPHSABS 1.8 07/06/2017 1615   MONOABS 0.4 11/30/2010 2143   EOSABS 0.2 07/06/2017 1615   BASOSABS 0.0 07/06/2017 1615    No results found for: "POCLITH", "LITHIUM"   Lab Results  Component Value Date   VALPROATE 46 (L) 12/27/2021     .res Assessment: Plan:    Plan:  PDMP reviewed  Intuniv 2mg  at hs. Seroquel 400mg  at bedtime. Depakote to 500mg  BID  Discussed potential benefits, risks, and side effects of Depakote and need for period lab monitoring to assess for potential adverse effects to include  obtaining CBC and LFTs. Last labs 12/29/2021.  RTC 8 weeks  Patient advised to contact office with any questions, adverse effects, or acute worsening in signs and symptoms.  Discussed potential metabolic side effects associated with atypical antipsychotics, as well as potential risk for movement side effects. Advised pt to contact office if movement side effects occur.    Diagnoses and all orders for this visit:  Episodic mood disorder ([place])  Attention deficit hyperactivity disorder (ADHD), unspecified ADHD type  Major depressive disorder, recurrent episode, moderate (HCC)  PTSD (post-traumatic stress disorder)  Generalized anxiety disorder  Insomnia, unspecified type     Please see After Visit Summary for patient specific instructions.  Future Appointments  Date Time Provider Department Center  07/25/2022 12:00 PM Marialy Urbanczyk, Thereasa Solo, NP [facility] None    No orders of the defined types were placed in this encounter.   -------------------------------

## 2022-06-24 ENCOUNTER — Telehealth: Payer: Self-pay | Admitting: Adult Health

## 2022-06-24 NOTE — Telephone Encounter (Signed)
Contacted patient. Recommended he go to [facility].  Address and phone number provided. Recommended he have someone drive him. He was agreeable to this.

## 2022-06-24 NOTE — Telephone Encounter (Signed)
Can you call him and refer to the [facility].

## 2022-06-24 NOTE — Telephone Encounter (Signed)
Pt called stated not doing good. Feels he needs to be admitted and don't know what to do. Feelings of possible harm to self. Contact pt ASAP @ [phone]

## 2022-06-25 ENCOUNTER — Ambulatory Visit (HOSPITAL_COMMUNITY)
Admission: EM | Admit: 2022-06-25 | Discharge: 2022-06-25 | Disposition: A | Discharge status: Home / Self Care | Payer: Commercial Managed Care - PPO

## 2022-06-25 DIAGNOSIS — F1290 Cannabis use, unspecified, uncomplicated: Secondary | ICD-10-CM | POA: Diagnosis not present

## 2022-06-25 DIAGNOSIS — F911 Conduct disorder, childhood-onset type: Secondary | ICD-10-CM

## 2022-06-25 DIAGNOSIS — F39 Unspecified mood [affective] disorder: Secondary | ICD-10-CM | POA: Diagnosis not present

## 2022-06-25 DIAGNOSIS — Z9189 Other specified personal risk factors, not elsewhere classified: Secondary | ICD-10-CM | POA: Diagnosis not present

## 2022-06-25 NOTE — Progress Notes (Signed)
[facility] entered resources into AVS  Kerianna Rawlinson [facility] 

## 2022-06-25 NOTE — ED Triage Notes (Signed)
Pt presents to [facility] voluntarily, accompanied by his wife with complaints of anger outbursts and mood swings. Pt reports hx of bipolar, PTSD, MDD. Pt reports that his wife is at her wits end and they are struggling in their marriage due to his issues. Pt is being followed by [facility] and he has not been compliant with medications this week. Pt was unable to remember name and dosage of medications at this time. Pt reports using marijuana daily and had two beers last night. Pt currently denies SI, HI, AVH.

## 2022-06-25 NOTE — ED Provider Notes (Signed)
Behavioral Health Urgent Care Medical Screening Exam  Patient Name: Jordan Christian MRN: [id number] Date of Evaluation: 06/26/22 Chief Complaint:   Diagnosis:  Final diagnoses:  Mild unsocial aggression  Mood disorder ([place])  At risk for medication noncompliance  Marijuana use    History of Present illness: Jordan Christian is a 26 y.o. male.  With a history of MDD, general anxiety disorder, ADHD, mood disorder and insomnia.  Presented to [facility] voluntarily accompanied by his wife.  Per the patient has been having issues with mood swings and aggression, but I do not wanted to continue to affect my relationship with my wife.  Per the patient he is currently seeing a psychiatrist treatment at [facility] last saw them on 9/18.  Patient currently takes medication, which includes Seroquel, and other medications that patient was unable to remember at this time. According to patient he was seeing a therapist but he did not really connect with the therapist so he has not seen a therapist in 4 months. Patient reported he smokes marijuana daily, drinks seldomly probably every 2 weeks.  When asked if he is compliant with his medication regiment patient stated sometimes he will forget to take the morning medicine but he does remember to take his night medicines.  Face-to-face observation of patient, patient is alert and oriented x 4, speech is clear, maintaining eye contact.  Mood is anxious affect congruent with mood.  Upon entering the room patient is observed sitting beside his wife patient is very open with his conversations.  Patient denies current suicidal ideation, HI, hallucination, per the patient he does feel a little bit paranoid at times that people do not like him because of what he he does or because of his mood at times. According to patient he was hospitalized once as a teenager because of cough medicine OD.  Patient denies access to or have ownership of a gun.  There is no evidence that  the patient is being influenced by external or internal stimuli at this time.  When asked if he feels that he is a threat to himself or others patient stated no he is very good at composing himself.  Per the patient he wished she could just get some outpatient resources because he definitely needs to go in and speak with the therapist because he thinks he has some childhood PTSD that are unresolved.  NP discussed with patient the walk-in psychiatry clinic and also provide patient with outpatient resources.  Recommend discharge for patient to follow-up with walk-in psychiatry and oral resources that were provided to him.    Psychiatric Specialty Exam  Presentation  General Appearance:Casual  Eye Contact:Good  Speech:Clear and Coherent  Speech Volume:Normal  Handedness:Right   Mood and Affect  Mood: Anxious; Euphoric  Affect: Congruent   Thought Process  Thought Processes: Coherent  Descriptions of Associations:Circumstantial  Orientation:Full (Time, Place and Person)  Thought Content:Logical  Diagnosis of Schizophrenia or Schizoaffective disorder in past: No   Hallucinations:None  Ideas of Reference:None  Suicidal Thoughts:No  Homicidal Thoughts:No   Sensorium  Memory: Immediate Good  Judgment: Good  Insight: Good   Executive Functions  Concentration: Good  Attention Span: Good  Recall: Good  Fund of Knowledge: Good  Language: Good   Psychomotor Activity  Psychomotor Activity: Normal   Assets  Assets: Desire for Improvement   Sleep  Sleep: Fair  Number of hours:  5   No data recorded  Physical Exam: Physical Exam HENT:  Head: Normocephalic.  Cardiovascular:     Rate and Rhythm: Normal rate.  Pulmonary:     Effort: Pulmonary effort is normal.  Musculoskeletal:        General: Normal range of motion.     Cervical back: Normal range of motion.  Neurological:     General: No focal deficit present.     Mental  Status: He is alert.  Psychiatric:        Mood and Affect: Mood normal.        Behavior: Behavior normal.    Review of Systems  Constitutional: Negative.   HENT: Negative.    Eyes: Negative.   Respiratory: Negative.    Cardiovascular: Negative.   Gastrointestinal: Negative.   Genitourinary: Negative.   Musculoskeletal: Negative.   Skin: Negative.   Neurological: Negative.   Endo/Heme/Allergies: Negative.   Psychiatric/Behavioral:  The patient is nervous/anxious.    Blood pressure (!) 134/95, pulse 89, temperature 98.9 F (37.2 C), temperature source Oral, resp. rate 20, SpO2 99 %. There is no height or weight on file to calculate BMI.  Musculoskeletal: Strength & Muscle Tone: within normal limits Gait & Station: normal Patient leans: N/A   [facility] MSE Discharge Disposition for Follow up and Recommendations: Based on my evaluation the patient does not appear to have an emergency medical condition and can be discharged with resources and follow up care in outpatient services for Medication Management, Individual Therapy, and Group Therapy   Sindy Guadeloupe, NP 06/26/2022, 4:18 AM

## 2022-06-25 NOTE — ED Provider Notes (Incomplete)
Behavioral Health Urgent Care Medical Screening Exam  Patient Name: Jordan Christian MRN: [id number] Date of Evaluation: 06/25/22 Chief Complaint:   Diagnosis:  Final diagnoses:  Mild unsocial aggression  Mood disorder (HCC)  At risk for medication noncompliance  Marijuana use    History of Present illness: Jordan Christian is a 26 y.o. male.  With a history of MDD, general anxiety disorder, ADHD, mood disorder and insomnia.  Presented to [facility] voluntarily accompanied by his wife.  Per the patient has been having issues with mood swings and aggression, but I do not wanted to continue to affect my relationship with my wife.  Per the patient he is currently seeing a psychiatrist treatment at [facility] last saw them on 9/18.  Patient currently takes medication, which includes Seroquel, and other medications that patient was unable to remember at this time. According to patient he was seeing a therapist but he did not really connect with the therapist so he has not seen a therapist in 4 months. Patient reported he smokes marijuana daily, drinks seldomly probably every 2 weeks.  When asked if he is compliant with his medication regiment patient stated sometimes he will forget to take the morning medicine but he does remember to take his night medicines.  Face-to-face observation of patient, patient is alert and oriented x 4, speech is clear, maintaining eye contact.  Mood is anxious affect congruent with mood.  Upon entering the room patient is observed sitting beside his wife patient is very open with his conversations.  Patient denies current suicidal ideation, HI, hallucination, per the patient he does feel a little bit paranoid at times that people do not like him because of what he he does or because of his mood at times. According to patient he was hospitalized once as a teenager because of cough medicine OD.  Patient denies access to or have ownership of a gun.  There is no evidence that  the patient is being influenced by external or internal stimuli at this time.  When asked if he feels that he is a threat to himself or others patient stated no he is very good at composing himself.  Per the patient he wished she could just get some outpatient resources because he definitely needs to go in and speak with the therapist because he thinks he has some childhood PTSD that are unresolved.  NP discussed with patient the walk-in psychiatry clinic and also provide patient with outpatient resources.  Recommend discharge for patient to follow-up with walk-in psychiatry and oral resources that were provided to him.    Psychiatric Specialty Exam  Presentation  General Appearance:Casual  Eye Contact:Good  Speech:Clear and Coherent  Speech Volume:Normal  Handedness:Right   Mood and Affect  Mood: Anxious; Euphoric  Affect: Congruent   Thought Process  Thought Processes: Coherent  Descriptions of Associations:Circumstantial  Orientation:Full (Time, Place and Person)  Thought Content:Logical  Diagnosis of Schizophrenia or Schizoaffective disorder in past: No   Hallucinations:None  Ideas of Reference:None  Suicidal Thoughts:No  Homicidal Thoughts:No   Sensorium  Memory: Immediate Good  Judgment: Good  Insight: Good   Executive Functions  Concentration: Good  Attention Span: Good  Recall: Good  Fund of Knowledge: Good  Language: Good   Psychomotor Activity  Psychomotor Activity: Normal   Assets  Assets: Desire for Improvement   Sleep  Sleep: Fair  Number of hours:  5   No data recorded  Physical Exam: Physical Exam HENT:  Head: Normocephalic.  Cardiovascular:     Rate and Rhythm: Normal rate.  Pulmonary:     Effort: Pulmonary effort is normal.  Musculoskeletal:        General: Normal range of motion.     Cervical back: Normal range of motion.  Neurological:     General: No focal deficit present.     Mental  Status: He is alert.  Psychiatric:        Mood and Affect: Mood normal.        Behavior: Behavior normal.    Review of Systems  Constitutional: Negative.   HENT: Negative.    Eyes: Negative.   Respiratory: Negative.    Cardiovascular: Negative.   Gastrointestinal: Negative.   Genitourinary: Negative.   Musculoskeletal: Negative.   Skin: Negative.   Neurological: Negative.   Endo/Heme/Allergies: Negative.   Psychiatric/Behavioral:  The patient is nervous/anxious.    Blood pressure (!) 134/95, pulse 89, temperature 98.9 F (37.2 C), temperature source Oral, resp. rate 20, SpO2 99 %. There is no height or weight on file to calculate BMI.  Musculoskeletal: Strength & Muscle Tone: within normal limits Gait & Station: normal Patient leans: N/A   [place] MSE Discharge Disposition for Follow up and Recommendations: Based on my evaluation the patient does not appear to have an emergency medical condition and can be discharged with resources and follow up care in outpatient services for Medication Management, Individual Therapy, and Group Therapy   Evette Georges, NP 06/25/2022, 9:27 PM

## 2022-06-25 NOTE — Telephone Encounter (Signed)
It does not appear patient went to [facility]. Called to check on him but no answer. LVM to RC.

## 2022-06-26 ENCOUNTER — Telehealth: Payer: Self-pay | Admitting: Adult Health

## 2022-06-26 NOTE — Telephone Encounter (Signed)
Pt called stating he is RTC from yesterday. Contact # [phone]

## 2022-06-26 NOTE — Telephone Encounter (Signed)
Gave patient # for [facility] IOP program. He went to [facility], but was not a candidate for inpatient hospitalization.

## 2022-06-27 ENCOUNTER — Telehealth (HOSPITAL_COMMUNITY): Payer: Self-pay | Admitting: Psychiatry

## 2022-06-27 NOTE — Telephone Encounter (Signed)
D:  Pt called to inquire about [facility]-IOP; states Jordan Lair, NP referred him.  A:  Oriented pt.  Pt will start on 07-03-22.  Encouraged pt to verify his benefits.  Inform Regina.  R:  Pt receptive.

## 2022-07-03 ENCOUNTER — Other Ambulatory Visit (HOSPITAL_COMMUNITY): Payer: Commercial Managed Care - PPO | Admitting: Licensed Clinical Social Worker

## 2022-07-03 ENCOUNTER — Encounter (HOSPITAL_COMMUNITY): Payer: Self-pay

## 2022-07-04 ENCOUNTER — Other Ambulatory Visit (HOSPITAL_COMMUNITY): Payer: Commercial Managed Care - PPO | Admitting: Licensed Clinical Social Worker

## 2022-07-07 ENCOUNTER — Other Ambulatory Visit (HOSPITAL_COMMUNITY): Payer: Commercial Managed Care - PPO | Admitting: Psychiatry

## 2022-07-08 ENCOUNTER — Other Ambulatory Visit (HOSPITAL_COMMUNITY): Payer: Commercial Managed Care - PPO | Admitting: Licensed Clinical Social Worker

## 2022-07-09 ENCOUNTER — Other Ambulatory Visit (HOSPITAL_COMMUNITY): Payer: Commercial Managed Care - PPO | Admitting: Psychiatry

## 2022-07-10 ENCOUNTER — Other Ambulatory Visit (HOSPITAL_COMMUNITY): Payer: Commercial Managed Care - PPO | Admitting: Licensed Clinical Social Worker

## 2022-07-11 ENCOUNTER — Other Ambulatory Visit (HOSPITAL_COMMUNITY): Payer: Commercial Managed Care - PPO | Admitting: Licensed Clinical Social Worker

## 2022-07-14 ENCOUNTER — Other Ambulatory Visit (HOSPITAL_COMMUNITY): Payer: Commercial Managed Care - PPO | Admitting: Licensed Clinical Social Worker

## 2022-07-15 ENCOUNTER — Encounter (HOSPITAL_COMMUNITY): Payer: Self-pay | Admitting: Psychiatry

## 2022-07-15 ENCOUNTER — Other Ambulatory Visit (HOSPITAL_COMMUNITY): Payer: Commercial Managed Care - PPO | Attending: Licensed Clinical Social Worker | Admitting: Psychiatry

## 2022-07-15 DIAGNOSIS — Z9152 Personal history of nonsuicidal self-harm: Secondary | ICD-10-CM | POA: Insufficient documentation

## 2022-07-15 DIAGNOSIS — F411 Generalized anxiety disorder: Secondary | ICD-10-CM | POA: Diagnosis not present

## 2022-07-15 DIAGNOSIS — F4310 Post-traumatic stress disorder, unspecified: Secondary | ICD-10-CM | POA: Insufficient documentation

## 2022-07-15 DIAGNOSIS — F909 Attention-deficit hyperactivity disorder, unspecified type: Secondary | ICD-10-CM | POA: Insufficient documentation

## 2022-07-15 DIAGNOSIS — G4700 Insomnia, unspecified: Secondary | ICD-10-CM

## 2022-07-15 DIAGNOSIS — F319 Bipolar disorder, unspecified: Secondary | ICD-10-CM | POA: Insufficient documentation

## 2022-07-15 MED ORDER — MIRTAZAPINE 15 MG PO TABS
15.0000 mg | ORAL_TABLET | Freq: Every day | ORAL | 0 refills | Status: DC
Start: 2022-07-15 — End: 2022-08-22

## 2022-07-15 NOTE — Plan of Care (Signed)
Pt is an active participant in his treatment plan.
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
## 2022-07-15 NOTE — Progress Notes (Signed)
Virtual Visit via Video Note  I connected with Jordan Christian on @TODAY @ at  9:00 AM EST by a video enabled telemedicine application and verified that I am speaking with the correct person using two identifiers.  Location: Patient: at home Provider: at office   I discussed the limitations of evaluation and management by telemedicine and the availability of in person appointments. The patient expressed understanding and agreed to proceed.   I discussed the assessment and treatment plan with the patient. The patient was provided an opportunity to ask questions and all were answered. The patient agreed with the plan and demonstrated an understanding of the instructions.   The patient was advised to call back or seek an in-person evaluation if the symptoms worsen or if the condition fails to improve as anticipated.  I provided 45 minutes of non-face-to-face time during this encounter.   Dellia Nims, M.Ed,CNA   Patient ID: Jordan Christian, male   DOB: 1995/10/09, 26 y.o.   MRN: [id number] D:  This is a 26 yr old, unemployed, married, Caucasian male, who was referred per Deloria Lair, NP; treatment for worsening mood swings.  Pt c/o anger and impulsiveness for several years.  States he's been in treatment for a number of years.  "I've had my ups and downs, and mood lability."  Pt reports temper outbursts whenever he's under a lot of stress.  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  2) Marriage of 2.5 yrs.  "I've done things to hurt her."  According to pt, she is wanting to leave pt.  3) Family:  states he's drove a wedge between his siblings. PHQ-9=18.  Denies SI/HI or A/V hallucinations. Cc: chart for more information A:Oriented pt. Pt was advised of ROI must be obtained prior to any records release in order to collaborate his care with an outside provider.  Pt was advised if he has not already done so to contact the front desk to sign all necessary forms  in order for [facility] to release info re: his care. Consent:  Pt gives verbal consent for tx and assignment of benefits for services provided during this telehealth group process.  Pt expressed understanding and agreed to proceed. Collaboration of care:  Collaborate with Dr. Fatima Sanger AEB, Deloria Lair, NP AEB, and [facility], LCSW AEB .  Will refer pt to a therapist, per his request.  Strongly encouraged support groups.   Pt will improve his mood as evidenced by being happy again, managing his mood and coping with daily stressors for 5 out of 7 days for 60 days.  R:  Pt receptive.   Dellia Nims, M.Ed,CNA

## 2022-07-15 NOTE — Progress Notes (Signed)
Virtual Visit via Video Note   I connected with Estrellita Ludwig. Stellmach on 07/15/22 at  9:00 AM EDT by a video enabled telemedicine application and verified that I am speaking with the correct person using two identifiers.   At orientation to the IOP program, Case Manager discussed the limitations of evaluation and management by telemedicine and the availability of in person appointments. The patient expressed understanding and agreed to proceed with virtual visits throughout the duration of the program.   Location:  Patient: Patient Home Provider: OPT [place] Office   History of Present Illness: Bipolar I Disorder and PTSD  Observations/Objective: Check In: Case Manager checked in with all participants to review discharge dates, insurance authorizations, work-related documents and needs from the treatment team regarding medications. Rodrickus stated needs and engaged in discussion.    Initial Therapeutic Activity: Counselor facilitated a check-in with Tijuan to assess for safety, sobriety and medication compliance.  Counselor also inquired about Dang's current emotional ratings, as well as any significant changes in thoughts, feelings or behavior since previous check in.  Tayte presented for session on time and was alert, oriented x5, with no evidence or self-report of active SI/HI or A/V H.  Dwon reported compliance with medication and denied use of alcohol.  Tison reported scores of 3/10 for depression, 0/10 for anxiety, and 0/10 for anger/irritability.  Jiles denied any recent panic attacks.  Leverne reported that a recent struggle was having to postpone starting group while he worked out insurance issues.  He reported that a success was avoiding an outburst when he got upset at his partner the other night, choosing to take a timeout instead, and revisiting the issue later when feeling calmer.  Hezzie disclosed that he smoked a 'bowl' of marijuana this morning before group.  Counselor informed Ernesto of  group rules against being under the influence while in session, and provided warning of removal from group if this occurs again.  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       Second Therapeutic Activity: Counselor introduced [employer], [place] Chaplain to provide psychoeducation on topic of Grief and Loss with members today.  Estill Bamberg began discussion by checking in with the group about their baseline mood today, general thoughts on what grief means to them and how it has affected them personally in the past.  Estill Bamberg provided information on how the process of grief/loss can differ depending upon one's unique culture, and categories of loss one could experience (i.e. loss of a person, animal, relationship, job, identity, etc).  Estill Bamberg encouraged members to be mindful of how pervasive loss can be, and how to recognize signs which could indicate that this is having an impact on one's overall mental health and wellbeing.  Intervention was effective, as evidenced by [name] participating in discussion with speaker on the subject, reporting that although loss is not a primary concern at the moment for him, something which brings him joy and helps him cope with loss is watching old sci-fi movies like Star Wars, since they make him laugh and serve as a positive distraction from difficulties.    Third Therapeutic Activity: Counselor discussed topic of gratitude journaling with members as a form of self-care.  Counselor virtually shared a handout with the group today which explained the benefits of this practice, including reduction in stress, increased happiness, and self-esteem.  Tips were also provided to aid in practice,  such as taking time with entries, writing about people one is grateful for, and setting goal for two entries per week for at least 10-20 minutes at a time.  Counselor also provided group members with a variety of journaling  prompts to choose from today, and encouraged each member to take time to write about something they are grateful for, with examples such as "Something beautiful I recently saw was..", "Something I can be proud of is.", "A reason to be excited for the future is." and more.  Members were encouraged to share their entry with the group, along with their perspective on the activity and motivation level towards making this a habit. Intervention was effective, as evidenced by Ivin Booty participating in journaling activity, and choosing the prompts "An experience I feel lucky to have had was." and "A reason to be excited for the future is.".  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  He reported that he is excited for the future because despite job loss, and recent frustrations, he feels confident that things will turn around for him eventually.  Melquan expressed motivation to add this activity to self-care routine, stating "I used to journal in the past and it definitely helped".      Assessment and Plan: Counselor recommends that Deontaye remain in IOP treatment to better manage mental health symptoms, ensure stability and pursue completion of treatment plan goals. Counselor recommends adherence to crisis/safety plan, taking medications as prescribed, and following up with medical professionals if any issues arise.   Follow Up Instructions: Counselor will send Webex link for next session. Camerin was advised to call back or seek an in-person evaluation if the symptoms worsen or if the condition fails to improve as anticipated.   Collaboration of Care:   Medication Management AEB Dr. Arna Snipe or Hillery Jacks, NP                                          Case Manager AEB Jeri Modena, CNA   Patient/Guardian was advised Release of  Information must be obtained prior to any record release in order to collaborate their care with an outside provider. Patient/Guardian was advised if they have not already done so to contact the registration department to sign all necessary forms in order for [place] to release information regarding their care.   Consent: Patient/Guardian gives verbal consent for treatment and assignment of benefits for services provided during this visit. Patient/Guardian expressed understanding and agreed to proceed.  I provided 180 minutes of non-face-to-face time during this encounter.   Noralee Stain, [place], LCAS 07/15/22

## 2022-07-16 ENCOUNTER — Other Ambulatory Visit (HOSPITAL_COMMUNITY): Payer: Commercial Managed Care - PPO | Admitting: Licensed Clinical Social Worker

## 2022-07-16 DIAGNOSIS — F319 Bipolar disorder, unspecified: Secondary | ICD-10-CM

## 2022-07-16 DIAGNOSIS — F4310 Post-traumatic stress disorder, unspecified: Secondary | ICD-10-CM
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
## 2022-07-17 ENCOUNTER — Other Ambulatory Visit (HOSPITAL_COMMUNITY): Payer: Commercial Managed Care - PPO | Admitting: Licensed Clinical Social Worker

## 2022-07-17 DIAGNOSIS — F319 Bipolar disorder, unspecified: Secondary | ICD-10-CM

## 2022-07-17 DIAGNOSIS — F4310 Post-traumatic stress disorder, unspecified: Secondary | ICD-10-CM

## 2022-07-17 NOTE — Progress Notes (Signed)
Virtual Visit via Video Note   I connected with Alric Seton. Geister on 07/17/22 at  9:00 AM EDT by a video enabled telemedicine application and verified that I am speaking with the correct person using two identifiers.   At orientation to the IOP program, Case Manager discussed the limitations of evaluation and management by telemedicine and the availability of in person appointments. The patient expressed understanding and agreed to proceed with virtual visits throughout the duration of the program.   Location:  Patient: Patient Home Provider: [facility]   History of Present Illness: Bipolar I Disorder and PTSD   Observations/Objective: Check In: Case Manager checked in with all participants to review discharge dates, insurance authorizations, work-related documents and needs from the treatment team regarding medications. Valente stated needs and engaged in discussion.    Initial Therapeutic Activity: Counselor facilitated a check-in with Mikey to assess for safety, sobriety and medication compliance.  Counselor also inquired about Rocklin's current emotional ratings, as well as any significant changes in thoughts, feelings or behavior since previous check in.  Ewald presented for session on time and was alert, oriented x5, with no evidence or self-report of active SI/HI or A/V H.  Americo reported compliance with medication and denied use of alcohol or illicit substances.  Tasean reported scores of 5/10 for depression, 0/10 for anxiety, and 0/10 for anger/irritability.  Amere denied any recent outbursts or panic attacks.  Darwyn reported that a recent success was visiting the gym again yesterday and pick up some ingredients for dinner.  Lesslie reported that a recent struggle was noticing mood fluctuations over the past day, which may have been triggered by financial stress in the marriage.  Kalee reported that his goal today is to visit the gym again.    Second Therapeutic Activity:  Counselor covered topic of core beliefs with group today.  Counselor virtually shared a handout on the subject, which explained how everyone looks at the world differently, and two people can have the same experience, but have different interpretations of what happened.  Members were encouraged to think of these like sunglasses with different "shades" influencing perception towards positive or negative outcomes.  Examples of negative core beliefs were provided, such as "I'm unlovable", "I'm not good enough", and "I'm a bad person".  Members were asked to share which one(s) they could relate to, and then identify evidence which contradicts these beliefs.  Counselor also provided psychoeducation on positive affirmations today.  Counselor explained how these are positive statements which can be spoken out loud or recited mentally to challenge negative thoughts and/or core beliefs to improve mood and outlook each day.  Counselor shared a comprehensive list of affirmations virtually to members with different categories, including ones for health, confidence, success, and happiness.  Counselor invited members to look through this list and identify any which resonated with them, and practice saying them out loud with sincerity.  Intervention was effective, as evidenced by Ivin Booty successfully participating in discussion on the subject and reporting that he could relate to several negative core beliefs listed on the handout, such as "People cant be trusted" and "I don't deserve to have close relationships".  Aeson was able to successfully challenge these beliefs by listing evidence which contradicts them, reporting that he has been able to confide in new supports via engagement in group, has received positive feedback and compassion, and has been able to offer them assistance as well with their issues.    Stefano also reported that he  liked several of the positive affirmations listed, such as "Tomorrow will be better", "The  future is good and I look toward it with hope", and "I refrain from comparing myself to others".     Assessment and Plan: Counselor recommends that Kailyn remain in IOP treatment to better manage mental health symptoms, ensure stability and pursue completion of treatment plan goals. Counselor recommends adherence to crisis/safety plan, taking medications as prescribed, and following up with medical professionals if any issues arise.   Follow Up Instructions: Counselor will send Webex link for next session. Nymir was advised to call back or seek an in-person evaluation if the symptoms worsen or if the condition fails to improve as anticipated.   Collaboration of Care:   Medication Management AEB Dr. Arna Snipe or Hillery Jacks, NP                                          Case Manager AEB Jeri Modena, CNA   Patient/Guardian was advised Release of Information must be obtained prior to any record release in order to collaborate their care with an outside provider. Patient/Guardian was advised if they have not already done so to contact the registration department to sign all necessary forms in order for [place] to release information regarding their care.   Consent: Patient/Guardian gives verbal consent for treatment and assignment of benefits for services provided during this visit. Patient/Guardian expressed understanding and agreed to proceed.  I provided 180 minutes of non-face-to-face time during this encounter.   Noralee Stain, [place], LCAS 07/17/22

## 2022-07-18 ENCOUNTER — Other Ambulatory Visit (HOSPITAL_COMMUNITY): Payer: Commercial Managed Care - PPO | Admitting: Licensed Clinical Social Worker

## 2022-07-18 DIAGNOSIS — F319 Bipolar disorder, unspecified: Secondary | ICD-10-CM | POA: Diagnosis not present

## 2022-07-18 DIAGNOSIS — F4310 Post-traumatic stress disorder, unspecified: Secondary | ICD-10-CM

## 2022-07-18 NOTE — Progress Notes (Signed)
Virtual Visit via Video Note   I connected with Estrellita Ludwig. Feltner on 07/18/22 at  9:00 AM EDT by a video enabled telemedicine application and verified that I am speaking with the correct person using two identifiers.   At orientation to the IOP program, Case Manager discussed the limitations of evaluation and management by telemedicine and the availability of in person appointments. The patient expressed understanding and agreed to proceed with virtual visits throughout the duration of the program.   Location:  Patient: Patient Home Provider: [facility]   History of Present Illness: Bipolar I Disorder and PTSD   Observations/Objective: Check In: Case Manager checked in with all participants to review discharge dates, insurance authorizations, work-related documents and needs from the treatment team regarding medications. Joshuwa stated needs and engaged in discussion.    Initial Therapeutic Activity: Counselor facilitated a check-in with Praxton to assess for safety, sobriety and medication compliance.  Counselor also inquired about Toma's current emotional ratings, as well as any significant changes in thoughts, feelings or behavior since previous check in.  Kaled presented for session on time and was alert, oriented x5, with no evidence or self-report of active SI/HI or A/V H.  Kode reported compliance with medication and denied use of alcohol or illicit substances.  Rayburn reported scores of 8/10 for depression, 5/10 for anxiety, and 7/10 for anger/irritability.  Riggins denied any recent panic attacks.  Kirkpatrick reported that a recent struggle was having an outburst yesterday when he and his wife had a disagreement about financial stress again.  Yi reported that they were able to talk things out when things were calmer, which was a success.  He disclosed that he has continued smoking marijuana at least x1 per day, but denied any consequences at this time.  Kayode reported that his  goal this weekend is to continue with exercise regimen since this offers a healthy outlet for stress.         Second Therapeutic Activity: Counselor proposed practice of guided imagery exercise with members today to improve their relaxation and stress management skills.  This visualization featured a casual walk through a nature trail in a forest on a pleasant fall day.  Counselor invited members to get comfortable, achieve a relaxing breathing pattern, and then began narration of this visualization, including various sensory details (i.e. feeling of the sun on the skin, smell of pine trees, sound of running stream and breeze through leaves) to enhance experience over course of activity.  Counselor processed experience afterward with each member, inquiring about effectiveness of activity, any issues that may have arisen, and whether they intend to add this to self-care routine.  Intervention was effective, as evidenced by [name] participating in activity successfully and reporting that he enjoyed it and would practice it again for his self-care routine due to positive impact upon mood.  He reported that it made him think about various trails he has run before and stated "It was like taking a trip to my happy place".    Third Therapeutic Activity: Counselor also acknowledged 2 graduating group members today by prompting each one to reflect on progress made since beginning the [facility] program, notable takeaways from sessions attended, challenges overcome, and plan for continued care following discharge. Counselor and group members shared observations of growth, words of encouragement and support as these individuals transitioned out of the program today.   Leny participated in activity by offering words of encouragement and support for both departing members, reporting that although  he had not known them long, they both had a positive impact on his experience in the program.       Assessment and Plan: Counselor  recommends that Udell remain in IOP treatment to better manage mental health symptoms, ensure stability and pursue completion of treatment plan goals. Counselor recommends adherence to crisis/safety plan, taking medications as prescribed, and following up with medical professionals if any issues arise.   Follow Up Instructions: Counselor will send Webex link for next session. Arael was advised to call back or seek an in-person evaluation if the symptoms worsen or if the condition fails to improve as anticipated.   Collaboration of Care:   Medication Management AEB Dr. Arna Snipe or Hillery Jacks, NP                                          Case Manager AEB Jeri Modena, CNA   Patient/Guardian was advised Release of Information must be obtained prior to any record release in order to collaborate their care with an outside provider. Patient/Guardian was advised if they have not already done so to contact the registration department to sign all necessary forms in order for [place] to release information regarding their care.   Consent: Patient/Guardian gives verbal consent for treatment and assignment of benefits for services provided during this visit. Patient/Guardian expressed understanding and agreed to proceed.  I provided 180 minutes of non-face-to-face time during this encounter.   Noralee Stain, [place], LCAS 07/18/22

## 2022-07-21 ENCOUNTER — Other Ambulatory Visit (HOSPITAL_COMMUNITY): Payer: Commercial Managed Care - PPO | Admitting: Professional

## 2022-07-21 DIAGNOSIS — F3162 Bipolar disorder, current episode mixed, moderate: Secondary | ICD-10-CM

## 2022-07-21 DIAGNOSIS — F319 Bipolar disorder, unspecified: Secondary | ICD-10-CM | POA: Diagnosis not present

## 2022-07-21 DIAGNOSIS — F4310 Post-traumatic stress disorder, unspecified: Secondary | ICD-10-CM | POA: Insufficient documentation

## 2022-07-22 ENCOUNTER — Other Ambulatory Visit (HOSPITAL_COMMUNITY): Payer: Commercial Managed Care - PPO | Attending: Licensed Clinical Social Worker | Admitting: Psychiatry

## 2022-07-22 ENCOUNTER — Other Ambulatory Visit (HOSPITAL_COMMUNITY): Payer: Commercial Managed Care - PPO | Admitting: Licensed Clinical Social Worker

## 2022-07-22 DIAGNOSIS — Z9152 Personal history of nonsuicidal self-harm: Secondary | ICD-10-CM | POA: Insufficient documentation

## 2022-07-22 DIAGNOSIS — F4310 Post-traumatic stress disorder, unspecified: Secondary | ICD-10-CM | POA: Insufficient documentation

## 2022-07-22 DIAGNOSIS — F909 Attention-deficit hyperactivity disorder, unspecified type: Secondary | ICD-10-CM | POA: Diagnosis not present

## 2022-07-22 DIAGNOSIS — F319 Bipolar disorder, unspecified: Secondary | ICD-10-CM | POA: Insufficient documentation

## 2022-07-22 DIAGNOSIS — Z79899 Other long term (current) drug therapy: Secondary | ICD-10-CM | POA: Insufficient documentation

## 2022-07-22 DIAGNOSIS — F411 Generalized anxiety disorder: Secondary | ICD-10-CM | POA: Insufficient documentation

## 2022-07-22 DIAGNOSIS — F331 Major depressive disorder, recurrent, moderate: Secondary | ICD-10-CM

## 2022-07-22 MED ORDER — DIVALPROEX SODIUM ER 250 MG PO TB24: 500.0000 mg | ORAL_TABLET | Freq: Two times a day (BID) | ORAL | 0 refills | Status: DC

## 2022-07-22 NOTE — Progress Notes (Signed)
BH MD/PA/NP IOP Progress Note   Virtual Visit via Video Note  I connected with Jordan Christian on 07/22/22 at  9:00 AM EST by a video enabled telemedicine application and verified that I am speaking with the correct person using two identifiers.  Location: Patient: [facility] Provider: [facility]   I discussed the limitations of evaluation and management by telemedicine and the availability of in person appointments. The patient expressed understanding and agreed to proceed.   07/22/2022 12:29 PM Jordan Christian  MRN:  [id number]  Chief Complaint:  Chief Complaint  Patient presents with   Follow-up   Agitation   Anxiety   Depression   HPI:  Jordan Christian is a 26 yr old male who presents via Virtual Video for follow up and medication management, he enrolled in the IOP program on 07/15/2022.  PPHx is significant for Bipolar Disorder, PTSD, GAD, and ADHD, and 1 OD when a teenage (Cough Medicine), history of Self Injurious Behavior (cutting that turned into dangerous activities that would hurt him- teenager), and no history of Psychiatric Hospitalizations.    He reports that he has continued to have issues with his mood and anger.  He reports that yesterday he got so angry that he wanted to break things and punch holes in the wall.  He reports that he went to the gym for 1.5 hours and worked out but still was very angry for a few more hours.  He reports that he is afraid he will undo all of his work.  Discussed increasing his Depakote specifically his morning dose to better control his mood and he is agreeable to this.  Discussed that we would like to get some blood work about 1 week after starting the medicine.  He reports that he will be on vacation next week for his wedding anniversary.  Discussed getting the blood work done Monday the 27th and he was agreeable to this.  He reports no side effects from medications.  He reports his sleep is still okay and that he gets about 5 to 6 hours but it is  interrupted.  He reports his appetite is doing good.  He reports no SI, HI, or AVH.  He reports no other concerns at present.  Visit Diagnosis:    ICD-10-CM   1. Bipolar I disorder (HCC)  F31.9 divalproex (DEPAKOTE ER) 250 MG 24 hr tablet    Valproic Acid level    Comprehensive Metabolic Panel (CMET)    2. PTSD (post-traumatic stress disorder)  F43.10     3. Major depressive disorder, recurrent episode, moderate (HCC)  F33.1     4. Attention deficit hyperactivity disorder (ADHD), unspecified ADHD type  F90.9     5. Generalized anxiety disorder  F41.1       Past Psychiatric History: Bipolar Disorder, PTSD, GAD, and ADHD, and 1 OD when a teenage (Cough Medicine), history of Self Injurious Behavior (cutting that turned into dangerous activities that would hurt him- teenager), and no history of Psychiatric Hospitalizations.   Past Medical History:  Past Medical History:  Diagnosis Date   ADHD    Depression    GERD (gastroesophageal reflux disease)     Past Surgical History:  Procedure Laterality Date   WISDOM TOOTH EXTRACTION      Family Psychiatric History: Bio Mother- Bipolar Disorder, possible BPD, Suicide Attempts Bio Sister- Bipolar Disorder, possible BPD, Suicide Attempts Father- Substance Abuse Paternal Grandmother- Suicide Attempts  Family History:  Family History  Adopted: Yes  Problem  Relation Age of Onset   Bipolar disorder Mother    Bipolar disorder Sister    Alcohol abuse Father     Social History:  Social History   Socioeconomic History   Marital status: Single    Spouse name: Not on file   Number of children: Not on file   Years of education: Not on file   Highest education level: Not on file  Occupational History   Occupation: Personnel officer  Tobacco Use   Smoking status: Former    Years: 3.50    Types: Cigarettes, E-cigarettes   Smokeless tobacco: Never  Building services engineer Use: Every day  Substance and Sexual Activity   Alcohol use: No     Alcohol/week: 1.0 - 2.0 standard drink of alcohol    Types: 1 - 2 Cans of beer per week    Comment: occ   Drug use: Yes    Types: Marijuana    Comment: occ   Sexual activity: Yes    Partners: Female    Birth control/protection: None  Other Topics Concern   Not on file  Social History Narrative   Not on file   Social Determinants of Health   Financial Resource Strain: Not on file  Food Insecurity: Not on file  Transportation Needs: Not on file  Physical Activity: Not on file  Stress: Not on file  Social Connections: Not on file    Allergies: No Known Allergies  Metabolic Disorder Labs: Lab Results  Component Value Date   HGBA1C 5.5 07/06/2017   No results found for: "PROLACTIN" No results found for: "CHOL", "TRIG", "HDL", "CHOLHDL", "VLDL", "LDLCALC" Lab Results  Component Value Date   TSH 2.093 06/08/2010    Therapeutic Level Labs: No results found for: "LITHIUM" Lab Results  Component Value Date   VALPROATE 46 (L) 12/27/2021   No results found for: "CBMZ"  Current Medications: Current Outpatient Medications  Medication Sig Dispense Refill   divalproex (DEPAKOTE ER) 250 MG 24 hr tablet Take 2-3 tablets (500-750 mg total) by mouth in the morning and at bedtime. Take 3 tablets (750 mg) in the morning and take 2 tablets (500 mg) at night. 150 tablet 0   cetirizine (ZYRTEC ALLERGY) 10 MG tablet Take 1 tablet (10 mg total) by mouth daily. 30 tablet 0   fluticasone (FLONASE) 50 MCG/ACT nasal spray Place 1 spray into both nostrils daily for 14 days. 16 g 0   guanFACINE (INTUNIV) 2 MG TB24 ER tablet Take 1 tablet (2 mg total) by mouth daily. 30 tablet 5   mirtazapine (REMERON) 15 MG tablet Take 1 tablet (15 mg total) by mouth at bedtime. Take a half tablet at night for 2 weeks then increase to a whole tablet 30 tablet 0   Multiple Vitamin (MULTIVITAMIN WITH MINERALS) TABS tablet Take 1 tablet by mouth daily.     omeprazole (PRILOSEC) 40 MG capsule TAKE 1 CAPSULE BY  MOUTH ONCE DAILY IN THE MORNING 30 capsule 1   QUEtiapine (SEROQUEL) 400 MG tablet Take one tablet at bedtime. 30 tablet 5   No current facility-administered medications for this visit.     Musculoskeletal: Strength & Muscle Tone: within normal limits Gait & Station:  Sitting during interview Patient leans: N/A  Psychiatric Specialty Exam: Review of Systems  Respiratory:  Negative for cough and shortness of breath.   Cardiovascular:  Negative for chest pain.  Gastrointestinal:  Negative for abdominal pain, constipation, diarrhea, nausea and vomiting.  Neurological:  Negative for weakness  and headaches.  Psychiatric/Behavioral:  Positive for agitation and sleep disturbance. Negative for dysphoric mood, hallucinations and suicidal ideas. The patient is nervous/anxious.     There were no vitals taken for this visit.There is no height or weight on file to calculate BMI.  General Appearance: Casual and Fairly Groomed  Eye Contact:  Good  Speech:  Clear and Coherent and Normal Rate  Volume:  Normal  Mood:  Anxious and Dysphoric  Affect:  Congruent  Thought Process:  Coherent and Goal Directed  Orientation:  Full (Time, Place, and Person)  Thought Content: WDL and Logical   Suicidal Thoughts:  No  Homicidal Thoughts:  No  Memory:  Immediate;   Good Recent;   Good  Judgement:  Good  Insight:  Good  Psychomotor Activity:  Normal  Concentration:  Concentration: Good and Attention Span: Good  Recall:  Good  Fund of Knowledge: Good  Language: Good  Akathisia:  Negative  Handed:  Right  AIMS (if indicated): not done  Assets:  Communication Skills Desire for Improvement Housing Physical Health Resilience Social Support  ADL's:  Intact  Cognition: WNL  Sleep:  Fair   Screenings: GAD-7    Advertising copywriter [facility] Counselor from 09/18/2017 in [facility] [place]  Total GAD-7  Score 19 16      PHQ2-9    Flowsheet Row Counselor from 07/07/2022 in [facility] Counselor from 10/01/2021 [facility] Counselor from 09/18/2017 in BEHAVIORAL HEALTH OUTPATIENT THERAPY [place] Office Visit from 03/12/2017 in [facility] at [facility] Total Score 4 4 2  0  PHQ-9 Total Score 18 16 11  --      Flowsheet Row Counselor from 07/07/2022 in BEHAVIOR[facility] INT[facility] Counselor from 10/01/2021 [facility] ED from [facility]18/2021 in [facility] at [facility] RISK CATEGORY Error: Question 6 not populated No Risk No Risk        Assessment and Plan:  Jordan Christian is a 26 yr old male who presents via Virtual Video for follow up and medication management, he enrolled in the IOP program on 07/15/2022.  PPHx is significant for Bipolar Disorder, PTSD, GAD, and ADHD, and 1 OD when a teenage (Cough Medicine), history of Self Injurious Behavior (cutting that turned into dangerous activities that would hurt him- teenager), and no history of Psychiatric Hospitalizations.    Jordan Christian has been continuing to struggle with his mood continuing to have outbursts.  We will increase his morning dose of his Depakote and have scheduled a Depakote level and CMP to monitor.  May need to make further increases based on lab results once he comes back from vacation.  We will continue to monitor.    Bipolar Disorder  GAD  PTSD: -Increase Depakote ER to 750 mg AM and 500 mg QHS.  150 (250 mg) tablets with 0 refills. -Continue Seroquel 400 mg QHS.  No refills sent at this time. -Continue Remeron 7.5 mg QHS for 1 more week then increase to 15 mg QHS, for depression, anxiety, and sleep.  No refills sent at this time. -Ordered Depakote Level and CMP for 11/27     ADHD: -Continue Intuniv 2 mg daily.  No refills sent at this time.    Collaboration of Care: Collaboration of  Care: Other IOP Program  Patient/Guardian was advised Release of Information must be obtained prior to any record release in order  to collaborate their care with an outside provider. Patient/Guardian was advised if they have not already done so to contact the registration department to sign all necessary forms in order for [place] to release information regarding their care.   Consent: Patient/Guardian gives verbal consent for treatment and assignment of benefits for services provided during this visit. Patient/Guardian expressed understanding and agreed to proceed.    Jordan Franklin, MD 07/22/2022, 12:29 PM      Follow Up Instructions:    I discussed the assessment and treatment plan with the patient. The patient was provided an opportunity to ask questions and all were answered. The patient agreed with the plan and demonstrated an understanding of the instructions.   The patient was advised to call back or seek an in-person evaluation if the symptoms worsen or if the condition fails to improve as anticipated.  I provided 15 minutes of non-face-to-face time during this encounter.   Jordan Franklin, MD

## 2022-07-22 NOTE — Progress Notes (Signed)
Virtual Visit via Video Note   I connected with Alric Seton. Blystone on 07/22/22 at  9:00 AM EDT by a video enabled telemedicine application and verified that I am speaking with the correct person using two identifiers.   At orientation to the IOP program, Case Manager discussed the limitations of evaluation and management by telemedicine and the availability of in person appointments. The patient expressed understanding and agreed to proceed with virtual visits throughout the duration of the program.   Location:  Patient: Patient Home Provider: Clinical Home Office   History of Present Illness: Bipolar I Disorder and PTSD   Observations/Objective: Check In: Case Manager checked in with all participants to review discharge dates, insurance authorizations, work-related documents and needs from the treatment team regarding medications. Paulanthony stated needs and engaged in discussion.    Initial Therapeutic Activity: Counselor facilitated a check-in with Taiki to assess for safety, sobriety and medication compliance.  Counselor also inquired about Ankur's current emotional ratings, as well as any significant changes in thoughts, feelings or behavior since previous check in.  Sanjiv presented for session on time and was alert, oriented x5, with no evidence or self-report of active SI/HI or A/V H.  Everest reported compliance with medication and denied use of alcohol or illicit substances.  Samule reported scores of 8/10 for depression, 10/10 for anxiety, and 8/10 for anger/irritability.  Pancho denied any recent panic attacks.  Kaz reported that a recent struggle was experiencing an outburst last night when he and his partner could not agree on vacation dates, stating "I'm not being kept in the loop right now".  Bricen reported that a success was writing about how he felt in the journal and visit the gym to process this event and feelings in a healthy manner.  Elyjah reported that his goal today is to take  a daytrip to the mountains and take a run.         Second Therapeutic Activity: Counselor introduced [employer], [place] Chaplain to provide psychoeducation on topic of Grief and Loss with members today.  Marchelle Folks began discussion by checking in with the group about their baseline mood today, general thoughts on what grief means to them and how it has affected them personally in the past.  Marchelle Folks provided information on how the process of grief/loss can differ depending upon one's unique culture, and categories of loss one could experience (i.e. loss of a person, animal, relationship, job, identity, etc).  Marchelle Folks encouraged members to be mindful of how pervasive loss can be, and how to recognize signs which could indicate that this is having an impact on one's overall mental health and wellbeing.  Intervention was effective, as evidenced by Ivin Booty participating in discussion with speaker on the subject, reporting that something which brings him joy and helps him cope with loss is doing physical activity of some kind, such as exercise, or busying himself with work since they serve as a healthy distraction and outlet for stress.    Third Therapeutic Activity: Counselor also acknowledged a graduating group member by prompting this member to reflect on progress made since beginning the [facility] program, notable takeaways from sessions attended, challenges overcome, and plan for continued care following discharge. Counselor and group members shared observations of growth, words of encouragement and support as this member transitioned out of the program today.  Jontay participated by sharing words of support and positivity regarding experience shared with this member during his time in [facility] so far.  He reported  that some advice she shared with him were meaningful and he took notes in his journal so he wouldn't forget.      Assessment and Plan: Counselor recommends that Malek remain in IOP treatment to better  manage mental health symptoms, ensure stability and pursue completion of treatment plan goals. Counselor recommends adherence to crisis/safety plan, taking medications as prescribed, and following up with medical professionals if any issues arise.   Follow Up Instructions: Counselor will send Webex link for next session. Mykah was advised to call back or seek an in-person evaluation if the symptoms worsen or if the condition fails to improve as anticipated.   Collaboration of Care:   Medication Management AEB Dr. Arna Snipe or Hillery Jacks, NP                                          Case Manager AEB Jeri Modena, CNA   Patient/Guardian was advised Release of Information must be obtained prior to any record release in order to collaborate their care with an outside provider. Patient/Guardian was advised if they have not already done so to contact the registration department to sign all necessary forms in order for [place] to release information regarding their care.   Consent: Patient/Guardian gives verbal consent for treatment and assignment of benefits for services provided during this visit. Patient/Guardian expressed understanding and agreed to proceed.  I provided 180 minutes of non-face-to-face time during this encounter.   Noralee Stain, [place], LCAS 07/22/22

## 2022-07-23 ENCOUNTER — Other Ambulatory Visit (HOSPITAL_COMMUNITY): Payer: Commercial Managed Care - PPO | Admitting: Licensed Clinical Social Worker

## 2022-07-23 ENCOUNTER — Telehealth (HOSPITAL_COMMUNITY): Payer: Self-pay | Admitting: Psychiatry

## 2022-07-24 ENCOUNTER — Ambulatory Visit (HOSPITAL_COMMUNITY)
Admission: EM | Admit: 2022-07-24 | Discharge: 2022-07-24 | Disposition: A | Discharge status: Home / Self Care | Payer: Commercial Managed Care - PPO | Attending: Psychiatry | Admitting: Psychiatry

## 2022-07-24 ENCOUNTER — Other Ambulatory Visit (HOSPITAL_COMMUNITY): Payer: Commercial Managed Care - PPO | Admitting: Psychiatry

## 2022-07-24 ENCOUNTER — Telehealth (HOSPITAL_COMMUNITY): Payer: Self-pay | Admitting: Psychiatry

## 2022-07-24 DIAGNOSIS — F909 Attention-deficit hyperactivity disorder, unspecified type: Secondary | ICD-10-CM | POA: Insufficient documentation

## 2022-07-24 DIAGNOSIS — F3160 Bipolar disorder, current episode mixed, unspecified: Secondary | ICD-10-CM | POA: Diagnosis present

## 2022-07-24 DIAGNOSIS — F32A Depression, unspecified: Secondary | ICD-10-CM | POA: Diagnosis not present

## 2022-07-24 DIAGNOSIS — R45851 Suicidal ideations: Secondary | ICD-10-CM | POA: Insufficient documentation

## 2022-07-24 DIAGNOSIS — F3162 Bipolar disorder, current episode mixed, moderate: Secondary | ICD-10-CM | POA: Diagnosis not present

## 2022-07-24 DIAGNOSIS — F4310 Post-traumatic stress disorder, unspecified: Secondary | ICD-10-CM | POA: Diagnosis not present

## 2022-07-24 DIAGNOSIS — Z1152 Encounter for screening for COVID-19: Secondary | ICD-10-CM | POA: Diagnosis not present

## 2022-07-24 LAB — CBC WITH DIFFERENTIAL/PLATELET
Abs Immature Granulocytes: 0.01 10*3/uL (ref 0.00–0.07)
Basophils Absolute: 0 10*3/uL (ref 0.0–0.1)
Basophils Relative: 1 %
Eosinophils Absolute: 0.1 10*3/uL (ref 0.0–0.5)
Eosinophils Relative: 1 %
HCT: 40.9 % (ref 39.0–52.0)
Hemoglobin: 14 g/dL (ref 13.0–17.0)
Immature Granulocytes: 0 %
Lymphocytes Relative: 35 %
Lymphs Abs: 2.1 10*3/uL (ref 0.7–4.0)
MCH: 29.9 pg (ref 26.0–34.0)
MCHC: 34.2 g/dL (ref 30.0–36.0)
MCV: 87.4 fL (ref 80.0–100.0)
Monocytes Absolute: 0.4 10*3/uL (ref 0.1–1.0)
Monocytes Relative: 6 %
Neutro Abs: 3.5 10*3/uL (ref 1.7–7.7)
Neutrophils Relative %: 57 %
Platelets: 246 10*3/uL (ref 150–400)
RBC: 4.68 MIL/uL (ref 4.22–5.81)
RDW: 12.6 % (ref 11.5–15.5)
WBC: 6.1 10*3/uL (ref 4.0–10.5)
nRBC: 0 % (ref 0.0–0.2)

## 2022-07-24 LAB — COMPREHENSIVE METABOLIC PANEL
ALT: 51 U/L — ABNORMAL HIGH (ref 0–44)
AST: 56 U/L — ABNORMAL HIGH (ref 15–41)
Albumin: 4.5 g/dL (ref 3.5–5.0)
Alkaline Phosphatase: 45 U/L (ref 38–126)
Anion gap: 9 (ref 5–15)
BUN: 7 mg/dL (ref 6–20)
CO2: 26 mmol/L (ref 22–32)
Calcium: 9.2 mg/dL (ref 8.9–10.3)
Chloride: 106 mmol/L (ref 98–111)
Creatinine, Ser: 0.89 mg/dL (ref 0.61–1.24)
GFR, Estimated: >60 mL/min (ref >60)
Glucose, Bld: 95 mg/dL (ref 70–99)
Potassium: 4 mmol/L (ref 3.5–5.1)
Sodium: 141 mmol/L (ref 135–145)
Total Bilirubin: 0.4 mg/dL (ref 0.3–1.2)
Total Protein: 7.1 g/dL (ref 6.5–8.1)

## 2022-07-24 LAB — HEMOGLOBIN A1C
Hgb A1c MFr Bld: 5.6 % (ref 4.8–5.6)
Mean Plasma Glucose: 114.02 mg/dL

## 2022-07-24 LAB — POCT URINE DRUG SCREEN - MANUAL ENTRY (I-SCREEN)
POC Amphetamine UR: NOT DETECTED
POC Buprenorphine (BUP): NOT DETECTED
POC Cocaine UR: NOT DETECTED
POC Marijuana UR: POSITIVE — AB
POC Methadone UR: NOT DETECTED
POC Methamphetamine UR: NOT DETECTED
POC Morphine: NOT DETECTED
POC Oxazepam (BZO): NOT DETECTED
POC Oxycodone UR: NOT DETECTED
POC Secobarbital (BAR): NOT DETECTED

## 2022-07-24 LAB — RESP PANEL BY RT-PCR (FLU A&B, COVID) ARPGX2
Influenza A by PCR: NEGATIVE
Influenza B by PCR: NEGATIVE
SARS Coronavirus 2 by RT PCR: NEGATIVE

## 2022-07-24 LAB — TSH: TSH: 1.051 u[IU]/mL (ref 0.350–4.500)

## 2022-07-24 LAB — ETHANOL: Alcohol, Ethyl (B): <10 mg/dL (ref <10)

## 2022-07-24 LAB — VALPROIC ACID LEVEL: Valproic Acid Lvl: 86 ug/mL (ref 50.0–100.0)

## 2022-07-24 LAB — POC SARS CORONAVIRUS 2 AG: SARSCOV2ONAVIRUS 2 AG: NEGATIVE

## 2022-07-24 MED ORDER — MAGNESIUM HYDROXIDE 400 MG/5ML PO SUSP: 30.0000 mL | Freq: Every day | ORAL | Status: DC | PRN

## 2022-07-24 MED ORDER — NICOTINE POLACRILEX 2 MG MT GUM: 2.0000 mg | CHEWING_GUM | OROMUCOSAL | Status: DC | PRN

## 2022-07-24 MED ORDER — MIRTAZAPINE 15 MG PO TABS: 15.0000 mg | ORAL_TABLET | Freq: Every day | ORAL | Status: DC

## 2022-07-24 MED ORDER — PANTOPRAZOLE SODIUM 40 MG PO TBEC
80.0000 mg | DELAYED_RELEASE_TABLET | Freq: Every day | ORAL | Status: DC
  Filled 2022-07-24: qty 2

## 2022-07-24 MED ORDER — ACETAMINOPHEN 325 MG PO TABS: 650.0000 mg | ORAL_TABLET | Freq: Four times a day (QID) | ORAL | Status: DC | PRN

## 2022-07-24 MED ORDER — ALUM & MAG HYDROXIDE-SIMETH 200-200-20 MG/5ML PO SUSP: 30.0000 mL | ORAL | Status: DC | PRN

## 2022-07-24 MED ORDER — DIVALPROEX SODIUM ER 500 MG PO TB24: 500.0000 mg | ORAL_TABLET | Freq: Every day | ORAL | Status: DC

## 2022-07-24 MED ORDER — DIVALPROEX SODIUM ER 500 MG PO TB24: 750.0000 mg | ORAL_TABLET | Freq: Every day | ORAL | Status: DC

## 2022-07-24 MED ORDER — NICOTINE POLACRILEX 2 MG MT GUM
2.0000 mg | CHEWING_GUM | Freq: Once | OROMUCOSAL | Status: AC
  Administered 2022-07-24: 2 mg via ORAL
  Filled 2022-07-24: qty 1

## 2022-07-24 MED ORDER — ADULT MULTIVITAMIN W/MINERALS CH
1.0000 | ORAL_TABLET | Freq: Every day | ORAL | Status: DC
  Administered 2022-07-24: 1 via ORAL
  Filled 2022-07-24: qty 1

## 2022-07-24 MED ORDER — GUANFACINE HCL ER 2 MG PO TB24: 2.0000 mg | ORAL_TABLET | Freq: Every day | ORAL | Status: DC

## 2022-07-24 MED ORDER — QUETIAPINE FUMARATE 200 MG PO TABS: 400.0000 mg | ORAL_TABLET | Freq: Every day | ORAL | Status: DC
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
## 2022-07-24 NOTE — ED Notes (Signed)
Patient request to speak to doctor because he received some bad news about hisi wife and possibly wanted to discharge. Darrick Grinder, NP and Doran Heater, Np made aware. Encouragement and support provided and safety maintained.
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
## 2022-07-24 NOTE — ED Notes (Signed)
Patient A&Ox4. Patient currently denies SI/HI and AVH. Patient denies any physical complaints when asked. No acute distress noted. Support and encouragement provided. Routine safety checks conducted according to facility protocol. Encouraged patient to notify staff if thoughts of harm toward self or others arise. Patient verbalize understanding and agreement. Will continue to monitor for safety.
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
## 2022-07-24 NOTE — Discharge Instructions (Signed)

## 2022-07-24 NOTE — Progress Notes (Signed)
Pt was accepted to [facility] BMU TODAY 07/24/22; Bed Assignment 320  Pt meets inpatient criteria per Lamar Sprinkles, MD  Attending Physician will be Dr. Marlana Latus, MD,  Report can be called to: - [phone]   Pt can arrive: Night Shift; 8:00pm  Care Team notified: Marquette Saa, RN, Lamar Sprinkles, MD, Marlana Latus, MD, Claudia Desanctis, RN, Tyrell Antonio, RN, Laretta Alstrom, LPN, Kia Diggs, LPN  Kelton Pillar, LCSWA 07/24/2022 @ 3:32 PM

## 2022-07-24 NOTE — Telephone Encounter (Signed)
D:  Patient hasn't returned to virtual MH-IOP.  He has been absent yesterday and today.  Pt isn't returning the case manager's phone calls either.  A:  Placed another call to pt.  Left vm requesting pt to call cm back as soon as possible or he will be discharged on 07-28-22 d/t non-compliancy with attendance.  Also, mentioned that cm will have to reach out to his emergency contact (his mother [phone]) if she doesn't ever hear from him.  Inform Dr. Renaldo Fiddler and treatment team.  Will reach out to inform pt's referral source [facility], NP) also.

## 2022-07-24 NOTE — ED Notes (Signed)
Patient A&O x 4, ambulatory. Patient discharged in no acute distress. Patient denied SI/HI, A/VH upon discharge. Patient verbalized understanding of all discharge instructions explained by staff, to include follow up appointments, and safety plan. Patient reported mood 10/10.  Pt belongings returned to patient from locker #  13 intact. Patient escorted to lobby via staff for transport to destination. Safety maintained.  

## 2022-07-24 NOTE — Progress Notes (Addendum)
Pt meets criteria for inpatient behavioral health placement per Lamar Sprinkles, MD. Per Dr. Toni Amend with [facility] has agreed to review pt for BMU admission. CSW will assist and follow with placement.  Maryjean Ka, MSW, LCSWA 07/24/2022 1:57 PM

## 2022-07-24 NOTE — Progress Notes (Signed)
Virtual Visit via Video Note   I connected with Alric Seton. Ferrell on 07/21/22 at  9:00 AM EDT by a video enabled telemedicine application and verified that I am speaking with the correct person using two identifiers.   At orientation to the IOP program, Case Manager discussed the limitations of evaluation and management by telemedicine and the availability of in person appointments. The patient expressed understanding and agreed to proceed with virtual visits throughout the duration of the program.   Location:  Patient: Patient Home Provider: Clinical Home Office   History of Present Illness: Bipolar I Disorder and PTSD   Observations/Objective: Check In: Case Manager checked in with all participants to review discharge dates, insurance authorizations, work-related documents and needs from the treatment team regarding medications. Lamarcus stated needs and engaged in discussion.    Initial Therapeutic Activity: Counselor facilitated a check-in with Ryon to assess for safety, sobriety and medication compliance.  Counselor also inquired about Robert's current emotional ratings, as well as any significant changes in thoughts, feelings or behavior since previous check in.  Brysan presented for session on time and was alert, oriented x5, with no evidence or self-report of active SI/HI or A/V H.  Donie reported compliance with medication and denied use of alcohol or illicit substances.  Keishaun reported scores of 8/10 for depression and 5/10 for anxiety. Kipper denied any recent panic attacks.  Oluwadamilola reported that a recent struggle continues to be working with his wife on what the future looks like.  Robie reported a success is using I-Statements with wife and not "blowing up."  He disclosed that he has continued smoking marijuana at least x1 per day, but denied any consequences at this time.    Second Therapeutic Activity: Counselor led discussion in how to use "no" as a complete sentence, using  I-statements, and giving self kudos for progress. Pt's identified if it is hard to give self credit for progress. Cln discussed "best friend test" and giving self as much "grace and space" as others are allowed. Cln provided examples of I-statements and discussed how they can be helpful in communicating with others. Cln and group discussed setting boundaries and using "no" as a complete sentence. Goerge reports difficulty with giving self credit. He identified wanting to work more on this. He reports he wants to continue to work on using I-Statements for better communication with others.  Third Therapeutic Activity: Clinician introduced topic of "Positive Psychology". Group watched "Positive Psychology" Ted-Talk. Patients discussed how their "lens" of life affects the way they feel and how they view reality. Group discussed 5 strategies to help change lens. Patients identified one strategy they would be willing to try to change their "lens" for at least 21 days to create a new habit. Cambell identified using meditation before exercising to help change his lens. Cln assessed for safety at the end of group. Dante denies SI/HI.      Assessment and Plan: Counselor recommends that Malakye remain in IOP treatment to better manage mental health symptoms, ensure stability and pursue completion of treatment plan goals. Counselor recommends adherence to crisis/safety plan, taking medications as prescribed, and following up with medical professionals if any issues arise.   Follow Up Instructions: Counselor will send Webex link for next session. Chrisean was advised to call back or seek an in-person evaluation if the symptoms worsen or if the condition fails to improve as anticipated.   Collaboration of Care:   Medication Management AEB Dr. Arna Snipe or Hillery Jacks,  NP                                          Case Manager AEB Jeri Modena, CNA   Patient/Guardian was advised Release of Information must be obtained  prior to any record release in order to collaborate their care with an outside provider. Patient/Guardian was advised if they have not already done so to contact the registration department to sign all necessary forms in order for [place] to release information regarding their care.   Consent: Patient/Guardian gives verbal consent for treatment and assignment of benefits for services provided during this visit. Patient/Guardian expressed understanding and agreed to proceed.  I provided 180 minutes of non-face-to-face time during this encounter.   Milana Na, [facility] 07/21/22

## 2022-07-25 ENCOUNTER — Encounter: Payer: Self-pay | Admitting: Adult Health

## 2022-07-25 ENCOUNTER — Telehealth (HOSPITAL_COMMUNITY): Payer: Self-pay | Admitting: Psychiatry

## 2022-07-25 ENCOUNTER — Ambulatory Visit (INDEPENDENT_AMBULATORY_CARE_PROVIDER_SITE_OTHER): Payer: Commercial Managed Care - PPO | Admitting: Adult Health

## 2022-07-25 ENCOUNTER — Other Ambulatory Visit (HOSPITAL_COMMUNITY): Payer: Commercial Managed Care - PPO | Admitting: Licensed Clinical Social Worker

## 2022-07-25 DIAGNOSIS — F909 Attention-deficit hyperactivity disorder, unspecified type: Secondary | ICD-10-CM | POA: Diagnosis not present

## 2022-07-25 DIAGNOSIS — F411 Generalized anxiety disorder: Secondary | ICD-10-CM | POA: Diagnosis not present

## 2022-07-25 DIAGNOSIS — F331 Major depressive disorder, recurrent, moderate: Secondary | ICD-10-CM | POA: Diagnosis not present

## 2022-07-25 DIAGNOSIS — F39 Unspecified mood [affective] disorder: Secondary | ICD-10-CM

## 2022-07-25 DIAGNOSIS — F4310 Post-traumatic stress disorder, unspecified: Secondary | ICD-10-CM

## 2022-07-25 NOTE — Progress Notes (Signed)
KOA ZOELLER [id number] [birth year] 26 y.o.  Subjective:   Patient ID:  Jordan Christian is a 26 y.o. (DOB 14-Apr-1996) male.  Chief Complaint: No chief complaint on file.   HPI Dwight Adamczak [place] presents to the office today for follow-up of MDD, GAD, ADHD, Mood disorder and insomnia.  Describes mood today as "ok". Pleasant. Reports tearfulness. Mood symptoms - reports depression, anxiety and irritability. Stating "there's a lot going on right now". Denies panic attacks. Reports physical and emotional outbursts - (Depakote recently increased - has not started increased dose yet). Having periods of mixed episodes - "highs and lows" - "quick fluctuations". Stating "I feel sad and hurt right now". Worried about his wife - recently overdosed - transferred to [facility]". Stating "right now I feel like I need to stand up and take care of things while she gets better". Also relays he understands his treatment is also important and he plans to continue working on himself when wife is better.  He has talked to his wife once and plans to visit her over the weekend. Has been taking care of the house - animals. Taking medications as prescribed. He and wife working on relationship. Varying interest and motivation. Taking medication as prescribed and feels they are helping.   Energy levels "it comes and goes". Active, restarted a regular exercise routine. Enjoys some usual interests and activities. Lives with wife, 6 cats and 1 dog. Wife's parents live in [place]. Spending time with family. Appetite adequate. Weight loss - 185 to 190 pounds. Sleeps better some nights than others - variable. Reporting nightmares over the past month. Focus and concentration - "it varies". Completing tasks. Managing aspects of household. Unemployed currently. Denies SI or HI.  Denies AH or VH. Denies self harm. Smoking THC every day. Decreased alcohol use - once or twice a week.   Working with a therapist - Suzan Garibaldi -  has not seen recently.  Previous medication trials: Celexa, Remeron, Concerta, Intuniv, Stratera, Wellbutrin   GAD-7    Flowsheet Row Counselor from [facility]/2021 [facility] Counselor from 09/18/2017 in [facility] [place]  Total GAD-7 Score 19 16      PHQ2-9    Flowsheet Row ED from 07/24/2022 in [facility] Counselor from 07/07/2022 in [facility] Counselor from 10/01/2021 [facility] Counselor from 09/18/2017 in BE[facility]IORAL [facility]ALTH OUTPATIENT THERAPY [place] Office Visit from 03/12/2017 in [facility] at [facility] Total Score 4 4 4 2  0  PHQ-9 Total Score 18 18 16 11  --      Flowsheet Row ED from 07/24/2022 in [facility] Counselor from 07/07/2022 in [facility] Counselor from 10/01/2021 in [facility]  C-SSRS RISK CATEGORY Moderate Risk Error: Question 6 not populated No Risk        Review of Systems:  Review of Systems  Musculoskeletal:  Negative for gait problem.  Neurological:  Negative for tremors.  Psychiatric/Behavioral:         Please refer to HPI    Medications: I have reviewed the patient's current medications.  Current Outpatient Medications  Medication Sig Dispense Refill   cetirizine (ZYRTEC ALLERGY) 10 MG tablet Take 1 tablet (10 mg total) by mouth daily. 30 tablet 0   divalproex (DEPAKOTE ER) 250 MG 24 hr tablet Take 2-3 tablets (500-750 mg total) by mouth in the morning and at bedtime. Take  3 tablets (750 mg) in the morning and take 2 tablets (500 mg) at night. 150 tablet 0   fluticasone (FLONASE) 50 MCG/ACT nasal spray Place 1 spray into both nostrils daily for 14 days. 16 g 0   guanFACINE (INTUNIV) 2 MG TB24 ER tablet Take 1 tablet (2 mg total) by mouth daily. 30 tablet 5   mirtazapine (REMERON) 15  MG tablet Take 1 tablet (15 mg total) by mouth at bedtime. Take a half tablet at night for 2 weeks then increase to a whole tablet 30 tablet 0   Multiple Vitamin (MULTIVITAMIN WITH MINERALS) TABS tablet Take 1 tablet by mouth daily.     omeprazole (PRILOSEC) 40 MG capsule TAKE 1 CAPSULE BY MOUTH ONCE DAILY IN THE MORNING 30 capsule 1   QUEtiapine (SEROQUEL) 400 MG tablet Take one tablet at bedtime. 30 tablet 5   No current facility-administered medications for this visit.    Medication Side Effects: None  Allergies: No Known Allergies  Past Medical History:  Diagnosis Date   ADHD    Depression    GERD (gastroesophageal reflux disease)     Past Medical History, Surgical history, Social history, and Family history were reviewed and updated as appropriate.   Please see review of systems for further details on the patient's review from today.   Objective:   Physical Exam:  There were no vitals taken for this visit.  Physical Exam Constitutional:      General: He is not in acute distress. Musculoskeletal:        General: No deformity.  Neurological:     Mental Status: He is alert and oriented to person, place, and time.     Coordination: Coordination normal.  Psychiatric:        Attention and Perception: Attention and perception normal. He does not perceive auditory or visual hallucinations.        Mood and Affect: Mood normal. Mood is not anxious or depressed. Affect is not labile, blunt, angry or inappropriate.        Speech: Speech normal.        Behavior: Behavior normal.        Thought Content: Thought content normal. Thought content is not paranoid or delusional. Thought content does not include homicidal or suicidal ideation. Thought content does not include homicidal or suicidal plan.        Cognition and Memory: Cognition and memory normal.        Judgment: Judgment normal.     Comments: Insight intact     Lab Review:     Component Value Date/Time   NA 141  07/24/2022 1527   NA 142 12/27/2021 1423   K 4.0 07/24/2022 1527   CL 106 07/24/2022 1527   CO2 26 07/24/2022 1527   GLUCOSE 95 07/24/2022 1527   BUN 7 07/24/2022 1527   BUN 16 12/27/2021 1423   CREATININE 0.89 07/24/2022 1527   CALCIUM 9.2 07/24/2022 1527   PROT 7.1 07/24/2022 1527   PROT 7.0 12/27/2021 1423   ALBUMIN 4.5 07/24/2022 1527   ALBUMIN 5.1 12/27/2021 1423   AST 56 (H) 07/24/2022 1527   ALT 51 (H) 07/24/2022 1527   ALKPHOS 45 07/24/2022 1527   BILITOT 0.4 07/24/2022 1527   BILITOT <0.2 12/27/2021 1423   GFRNONAA >60 07/24/2022 1527   GFRAA 137 03/14/2017 1350       Component Value Date/Time   WBC 6.1 07/24/2022 1527   RBC 4.68 07/24/2022 1527   HGB 14.0 07/24/2022  1527   HGB 13.8 07/06/2017 1615   HCT 40.9 07/24/2022 1527   HCT 40.3 07/06/2017 1615   PLT 246 07/24/2022 1527   PLT 224 07/06/2017 1615   MCV 87.4 07/24/2022 1527   MCV 88 07/06/2017 1615   MCH 29.9 07/24/2022 1527   MCHC 34.2 07/24/2022 1527   RDW 12.6 07/24/2022 1527   RDW 13.5 07/06/2017 1615   LYMPHSABS 2.1 07/24/2022 1527   LYMPHSABS 1.8 07/06/2017 1615   MONOABS 0.4 07/24/2022 1527   EOSABS 0.1 07/24/2022 1527   EOSABS 0.2 07/06/2017 1615   BASOSABS 0.0 07/24/2022 1527   BASOSABS 0.0 07/06/2017 1615    No results found for: "POCLITH", "LITHIUM"   Lab Results  Component Value Date   VALPROATE 86 07/24/2022     .res Assessment: Plan:    Plan:  PDMP reviewed  Intuniv 2mg  at hs. Remeron 15mg  at hs - recently added by other provider. Seroquel 400mg  at bedtime. Depakote to 500mg  to 750mg  BID - recently increased by other provider.  Working with IOP program - [facility].  Discussed potential benefits, risks, and side effects of Depakote and need for period lab monitoring to assess for potential adverse effects to include obtaining CBC and LFTs. Last labs 12/29/2021.  RTC 8 weeks  Patient advised to contact office with any questions, adverse effects, or acute worsening in signs  and symptoms.  Discussed potential metabolic side effects associated with atypical antipsychotics, as well as potential risk for movement side effects. Advised pt to contact office if movement side effects occur.   Diagnoses and all orders for this visit:  Major depressive disorder, recurrent episode, moderate (HCC)  PTSD (post-traumatic stress disorder)  Attention deficit hyperactivity disorder (ADHD), unspecified ADHD type  Generalized anxiety disorder  Episodic mood disorder (HCC)     Please see After Visit Summary for patient specific instructions.  Future Appointments  Date Time Provider Department Center  08/22/2022 11:40 AM Robinson Brinkley, , NP CP-CP None  09/10/2022  2:00 PM , [facility] None    No orders of the defined types were placed in this encounter.   -------------------------------
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
## 2022-07-28 ENCOUNTER — Telehealth (HOSPITAL_COMMUNITY): Payer: Self-pay | Admitting: Psychiatry

## 2022-07-28 ENCOUNTER — Encounter (HOSPITAL_COMMUNITY): Payer: Self-pay | Admitting: Licensed Clinical Social Worker

## 2022-07-28 ENCOUNTER — Other Ambulatory Visit (HOSPITAL_COMMUNITY): Payer: Commercial Managed Care - PPO | Attending: Licensed Clinical Social Worker | Admitting: Psychiatry

## 2022-07-28 DIAGNOSIS — F1220 Cannabis dependence, uncomplicated: Secondary | ICD-10-CM | POA: Insufficient documentation

## 2022-07-28 DIAGNOSIS — F418 Other specified anxiety disorders: Secondary | ICD-10-CM | POA: Diagnosis not present

## 2022-07-28 DIAGNOSIS — F331 Major depressive disorder, recurrent, moderate: Secondary | ICD-10-CM

## 2022-07-28 DIAGNOSIS — R4587 Impulsiveness: Secondary | ICD-10-CM | POA: Insufficient documentation

## 2022-07-28 DIAGNOSIS — G479 Sleep disorder, unspecified: Secondary | ICD-10-CM | POA: Diagnosis not present

## 2022-07-28 DIAGNOSIS — F4310 Post-traumatic stress disorder, unspecified: Secondary | ICD-10-CM

## 2022-07-28 NOTE — Progress Notes (Signed)
Virtual Visit via Video Note   I connected with Jordan Christian. Bitter on 07/28/22 at  9:00 AM EDT by a video enabled telemedicine application and verified that I am speaking with the correct person using two identifiers.   At orientation to the IOP program, Case Manager discussed the limitations of evaluation and management by telemedicine and the availability of in person appointments. The patient expressed understanding and agreed to proceed with virtual visits throughout the duration of the program.   Location:  Patient: Patient Home Provider: OPT [place] Office   History of Present Illness: Bipolar I Disorder and PTSD   Observations/Objective: Check In: Case Manager checked in with all participants to review discharge dates, insurance authorizations, work-related documents and needs from the treatment team regarding medications. Jordan Christian stated needs and engaged in discussion.    Initial Therapeutic Activity: Counselor facilitated a check-in with Jordan Christian to assess for safety, sobriety and medication compliance.  Counselor also inquired about Jordan Christian's current emotional ratings, as well as any significant changes in thoughts, feelings or behavior since previous check in.  Jordan Christian presented for session on time and was alert, oriented x5, with no evidence or self-report of active SI/HI or A/V H.  Jordan Christian reported compliance with medication and denied use of alcohol or illicit substances.  Jordan Christian reported scores of 8/10 for depression, 8/10 for anxiety, and 2/10 for anger/irritability.  Jordan Christian denied any recent outbursts or panic attacks.  Jordan Christian reported that a recent success was visiting his wife in the hospital, stating "It was also our 2 year anniversary".  Jordan Christian reported that he also spent time with his parents over the weekend for support.  Jordan Christian reported that a recent struggle was dealing with his wife's parents during his visit, although they kept things civil.  Jordan Christian reported that his goal today is to  visit the gym in order to ventilate stress.      Second Therapeutic Activity: Counselor introduced topic of self-care today.  Counselor explained how this can be defined as the things one does to maintain good health and improve well-being.  Counselor provided members with a self-care assessment form to complete.  This handout featured various sub-categories of self-care, including physical, psychological/emotional, social, spiritual, and professional.  Members were asked to rank their engagement in the activities listed for each dimension on a scale of 1-3, with 1 indicating 'Poor', 2 indicating '[place]', and 3 indicating 'Well'.  Counselor invited members to share results of their assessment, and inquired about which areas of self-care they are doing well in, as well as areas that require attention, and how they plan to begin addressing this during treatment.  Intervention was effective, as evidenced by Jordan Christian successfully completing initial 2 sections of assessment and actively engaging in discussion on subject, reporting that he is excelling in areas such as exercising regularly, taking care of personal hygiene, and resting when sick, but would benefit from focusing more on areas such as getting enough sleep, and attending preventative medical appointments.  Jordan Christian reported that he would work to improve self-care deficits by avoiding use of alcohol before bed due to problems that could result, setting an appointment to see a dentist and eye doctor soon.    Assessment and Plan: Counselor recommends that Jordan Christian remain in IOP treatment to better manage mental health symptoms, ensure stability and pursue completion of treatment plan goals. Counselor recommends adherence to crisis/safety plan, taking medications as prescribed, and following up with medical professionals if any issues arise.   Follow Up Instructions:  Counselor will send Webex link for next session. Jordan Christian was advised to call back or seek an  in-person evaluation if the symptoms worsen or if the condition fails to improve as anticipated.   Collaboration of Care:   Medication Management AEB Dr. Arna Snipe or Jordan Jacks, NP                                          Case Manager AEB Jordan Modena, CNA   Patient/Guardian was advised Release of Information must be obtained prior to any record release in order to collaborate their care with an outside provider. Patient/Guardian was advised if they have not already done so to contact the registration department to sign all necessary forms in order for [place] to release information regarding their care.   Consent: Patient/Guardian gives verbal consent for treatment and assignment of benefits for services provided during this visit. Patient/Guardian expressed understanding and agreed to proceed.  I provided 180 minutes of non-face-to-face time during this encounter.   Noralee Stain, [place], LCAS 07/28/22
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
## 2022-07-29 ENCOUNTER — Telehealth (HOSPITAL_COMMUNITY): Payer: Self-pay | Admitting: Psychiatry

## 2022-07-29 NOTE — Telephone Encounter (Signed)
D:  Placed call to check in on pt because he didn't report to [facility] as scheduled to sign himself in.  Pt states he was cleaning his home and didn't get around to it.  Whenever cm asked if he was going today pt stated no.  "I am really up in the air about going in [place]; I really don't feel like I need to now."  Pt denies SI/HI or A/V hallucinations.  Denies having the feeling of wanting to destroy things.  A:  Provided pt with support.  Discussed safety options at length with pt.  Strongly encouraged pt to f/u with Yvette Rack, NP.  Pt states Rene Kocher got pt scheduled with a therapist Thayer Ohm) in her office.  Informed Dr. Arna Snipe.  R: Pt receptive.

## 2022-08-22 ENCOUNTER — Telehealth: Payer: Commercial Managed Care - PPO | Admitting: Adult Health

## 2022-08-22 ENCOUNTER — Telehealth: Payer: Self-pay | Admitting: Adult Health

## 2022-08-22 DIAGNOSIS — G4700 Insomnia, unspecified: Secondary | ICD-10-CM

## 2022-08-22 DIAGNOSIS — F4310 Post-traumatic stress disorder, unspecified: Secondary | ICD-10-CM

## 2022-08-22 MED ORDER — MIRTAZAPINE 15 MG PO TABS: 15.0000 mg | ORAL_TABLET | Freq: Every day | ORAL | 0 refills | Status: DC

## 2022-08-22 NOTE — Telephone Encounter (Signed)
Pt requesting Rx for Mirtazepine @ [facility]. Apt 1/5 (RS from 12/15 provider)

## 2022-08-22 NOTE — Telephone Encounter (Signed)
Sent!

## 2022-09-10 ENCOUNTER — Ambulatory Visit: Payer: Commercial Managed Care - PPO | Admitting: Mental Health

## 2022-09-12 ENCOUNTER — Telehealth (INDEPENDENT_AMBULATORY_CARE_PROVIDER_SITE_OTHER): Payer: Self-pay | Admitting: Adult Health

## 2022-09-12 DIAGNOSIS — F489 Nonpsychotic mental disorder, unspecified: Secondary | ICD-10-CM

## 2022-09-12 NOTE — Progress Notes (Signed)
Patient no show appointment. Did not connect to video or answer phone. LVM to call back and r/s.

## 2022-10-07 ENCOUNTER — Ambulatory Visit (INDEPENDENT_AMBULATORY_CARE_PROVIDER_SITE_OTHER): Payer: 59 | Admitting: Mental Health

## 2022-10-07 DIAGNOSIS — F331 Major depressive disorder, recurrent, moderate: Secondary | ICD-10-CM

## 2022-10-07 NOTE — Progress Notes (Signed)
[facility] Counselor Initial Adult Exam  Name: Jordan Christian Date: 10/20/2022 MRN: [id number] DOB: [birth year] PCP: Patient, No Pcp Per  Time spent: 52 minutes  Reason for Visit /Presenting Problem: patient reports he has a history of past trauma, resulting in negatively affecting relationships with family and others. He reports having anger flare-ups, may get "snarky and rude", denies outward aggression "I dont do that anymore".  He stated he has choices to make- deciding if his marriage will continue. Try to mend relationships w/ family, sister if possible. He is still close with 2 of his brothers. He does not have a relationship w/ his in-laws. He has been married for the past 2 years, 7 total together.  Reports having mood swings, reports dx hx of ADHD, bipolar and anxiety. He is in care with Deloria Lair, NP.  He thinks he may have sleep apnea, they have 6 cats in the house. Reports mood has been "down", struggling with motivation to go to work, go to the gym. This past weekend was difficult due to their talking about separation again.  He was engaged in [facility] though [facility] last October due to changing jobs and the stress of being out of work and he and his wife having marital issues. He quit IOP to try and go psychiatric inpatient, but this did not occur. His wife was inpatient at [facility] due to a suicide attempt- OD.  He is trying to open up and communicate more effectively.   Recommended patient continue med management with Deloria Lair, NP and to return to therapy in 2 weeks.  Mental Status Exam:    Appearance:    Casual     Behavior:   Appropriate  Motor:   WNL  Speech/Language:    Clear and Coherent  Affect:   Full range   Mood:   Euthymic  Thought process:   Logical, linear, goal directed  Thought content:     WNL  Sensory/Perceptual disturbances:     none  Orientation:   x4  Attention:   Good  Concentration:   Good  Memory:   Intact  Fund of knowledge:     Consistent with age and development  Insight:     Good  Judgment:    Good  Impulse Control:   Good     Reported Symptoms:  mood swings, anger, anxiety, depression  Risk Assessment: Danger to Self:  No Self-injurious Behavior: No Danger to Others: No Duty to Warn:no Physical Aggression / Violence:No  Access to Firearms a concern: No  Gang Involvement:No  Patient / guardian was educated about steps to take if suicide or homicide risk level increases between visits: yes While future psychiatric events cannot be accurately predicted, the patient does not currently require acute inpatient psychiatric care and does not currently meet [facility] involuntary commitment criteria.  Substance Abuse History: Current substance abuse: alcohol abuse - not presently, only drinks 1 day/week.  THC daily  Past Psychiatric History:   [place] IOP; therapy about 3 years ago History of Psych Hospitalization: none Psychological Testing: none   Family History:  Family History  Adopted: Yes  Problem Relation Age of Onset   Bipolar disorder Mother    Bipolar disorder Sister    Alcohol abuse Father       Medical History/Surgical History: Past Medical History:  Diagnosis Date   ADHD    Depression    GERD (gastroesophageal reflux disease)     Past Surgical History:  Procedure Laterality  Date   WISDOM TOOTH EXTRACTION      Medications: Current Outpatient Medications  Medication Sig Dispense Refill   cetirizine (ZYRTEC ALLERGY) 10 MG tablet Take 1 tablet (10 mg total) by mouth daily. 30 tablet 0   divalproex (DEPAKOTE ER) 250 MG 24 hr tablet Take 2-3 tablets (500-750 mg total) by mouth in the morning and at bedtime. Take 3 tablets (750 mg) in the morning and take 2 tablets (500 mg) at night. 150 tablet 0   fluticasone (FLONASE) 50 MCG/ACT nasal spray Place 1 spray into both nostrils daily for 14 days. 16 g 0   guanFACINE (INTUNIV) 2 MG TB24 ER tablet Take 1 tablet (2 mg total) by  mouth daily. 30 tablet 5   mirtazapine (REMERON) 15 MG tablet Take 1 tablet (15 mg total) by mouth at bedtime. 30 tablet 0   Multiple Vitamin (MULTIVITAMIN WITH MINERALS) TABS tablet Take 1 tablet by mouth daily.     omeprazole (PRILOSEC) 40 MG capsule TAKE 1 CAPSULE BY MOUTH ONCE DAILY IN THE MORNING 30 capsule 1   QUEtiapine (SEROQUEL) 400 MG tablet Take one tablet at bedtime. 30 tablet 5   No current facility-administered medications for this visit.    No Known Allergies  Diagnoses:    ICD-10-CM   1. Major depressive disorder, recurrent episode, moderate ([place])  F33.1       Plan of Care: TBD   Anson [place], [facility]

## 2022-10-27 ENCOUNTER — Telehealth: Payer: Self-pay | Admitting: Adult Health

## 2022-10-27 NOTE — Telephone Encounter (Signed)
Change of insurance 2024. Needs to use [facility] now -  Please send Rxs to: [facility] on [address] in [place], Remeron, Seroquel, Depakote 555m (3 day)  to be sent to new pharmacy and needs 90 day RX

## 2022-10-29 ENCOUNTER — Other Ambulatory Visit: Payer: Self-pay

## 2022-10-29 DIAGNOSIS — F319 Bipolar disorder, unspecified: Secondary | ICD-10-CM

## 2022-10-29 DIAGNOSIS — G4700 Insomnia, unspecified: Secondary | ICD-10-CM

## 2022-10-29 DIAGNOSIS — F909 Attention-deficit hyperactivity disorder, unspecified type: Secondary | ICD-10-CM

## 2022-10-29 DIAGNOSIS — F4310 Post-traumatic stress disorder, unspecified: Secondary | ICD-10-CM

## 2022-10-29 DIAGNOSIS — F331 Major depressive disorder, recurrent, moderate: Secondary | ICD-10-CM

## 2022-10-29 MED ORDER — DIVALPROEX SODIUM ER 250 MG PO TB24: 500.0000 mg | ORAL_TABLET | Freq: Two times a day (BID) | ORAL | 0 refills | Status: DC

## 2022-10-29 MED ORDER — GUANFACINE HCL ER 2 MG PO TB24: 2.0000 mg | ORAL_TABLET | Freq: Every day | ORAL | 0 refills | Status: DC

## 2022-10-29 MED ORDER — MIRTAZAPINE 15 MG PO TABS: 15.0000 mg | ORAL_TABLET | Freq: Every day | ORAL | 0 refills | Status: DC

## 2022-10-29 MED ORDER — QUETIAPINE FUMARATE 400 MG PO TABS: ORAL_TABLET | ORAL | 0 refills | Status: DC

## 2022-10-29 NOTE — Telephone Encounter (Signed)
He has an appointment on Friday march 1

## 2022-10-29 NOTE — Telephone Encounter (Signed)
Please call patient to schedule an appt, was a no show in January.

## 2022-11-03 ENCOUNTER — Ambulatory Visit: Payer: 59 | Admitting: Mental Health

## 2022-11-07 ENCOUNTER — Ambulatory Visit (INDEPENDENT_AMBULATORY_CARE_PROVIDER_SITE_OTHER): Payer: Self-pay | Admitting: Adult Health

## 2022-11-07 DIAGNOSIS — F489 Nonpsychotic mental disorder, unspecified: Secondary | ICD-10-CM

## 2022-11-07 NOTE — Progress Notes (Signed)
Patient no show appointment. ? ?

## 2022-11-14 ENCOUNTER — Ambulatory Visit: Payer: 59 | Admitting: Mental Health

## 2022-11-23 ENCOUNTER — Telehealth: Payer: Self-pay

## 2022-11-23 NOTE — Telephone Encounter (Signed)
Prior Authorization submitted for Guanfacine ER 2 mg #90/90 with [facility], pending response

## 2022-11-25 NOTE — Telephone Encounter (Signed)
Prior Approval received for [facility] ER 2 mg tablets approved through 11/23/2023 with [facility], [id number]

## 2022-11-28 ENCOUNTER — Ambulatory Visit (INDEPENDENT_AMBULATORY_CARE_PROVIDER_SITE_OTHER): Payer: 59 | Admitting: Mental Health

## 2022-11-28 DIAGNOSIS — F909 Attention-deficit hyperactivity disorder, unspecified type: Secondary | ICD-10-CM

## 2022-11-28 DIAGNOSIS — F331 Major depressive disorder, recurrent, moderate: Secondary | ICD-10-CM
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
## 2022-12-12 ENCOUNTER — Ambulatory Visit (INDEPENDENT_AMBULATORY_CARE_PROVIDER_SITE_OTHER): Payer: 59 | Admitting: Mental Health

## 2022-12-12 DIAGNOSIS — F331 Major depressive disorder, recurrent, moderate: Secondary | ICD-10-CM
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
## 2022-12-16 ENCOUNTER — Ambulatory Visit: Payer: 59 | Admitting: Adult Health

## 2022-12-25 ENCOUNTER — Ambulatory Visit (INDEPENDENT_AMBULATORY_CARE_PROVIDER_SITE_OTHER): Payer: Self-pay | Admitting: Adult Health

## 2022-12-25 DIAGNOSIS — Z0389 Encounter for observation for other suspected diseases and conditions ruled out: Secondary | ICD-10-CM

## 2022-12-25 NOTE — Progress Notes (Signed)
Patient no show appointment. ? ?

## 2022-12-26 ENCOUNTER — Ambulatory Visit: Payer: 59 | Admitting: Mental Health

## 2023-01-09 ENCOUNTER — Ambulatory Visit (INDEPENDENT_AMBULATORY_CARE_PROVIDER_SITE_OTHER): Payer: 59 | Admitting: Mental Health

## 2023-01-09 DIAGNOSIS — F331 Major depressive disorder, recurrent, moderate: Secondary | ICD-10-CM | POA: Diagnosis not present

## 2023-01-09 NOTE — Progress Notes (Signed)
[facility] psychotherapy note  Name: Jordan Christian Date: 01/09/2023 MRN: [id number] DOB: [birth year] PCP: Jordan Christian  Time spent: 51 minutes  Treatment:   ind. therapy  Mental Status Exam:    Appearance:    Casual     Behavior:   Appropriate  Motor:   WNL  Speech/Language:    Clear and Coherent  Affect:   Full range   Mood:   Sad, pleasant  Thought process:   Logical, linear, goal directed  Thought content:     WNL  Sensory/Perceptual disturbances:     none  Orientation:   x4  Attention:   Good  Concentration:   Good  Memory:   Intact  Fund of knowledge:    Consistent with age and development  Insight:     Good  Judgment:    Good  Impulse Control:   Good     Reported Symptoms:  mood swings, anger, anxiety, depression  Risk Assessment: Danger to Self:  No Self-injurious Behavior: No Danger to Others: No Duty to Warn:no Physical Aggression / Violence:No  Access to Firearms a concern: No  Gang Involvement:No  Patient / guardian was educated about steps to take if suicide or homicide risk level increases between visits: yes While future psychiatric events cannot be accurately predicted, the patient does not currently require acute inpatient psychiatric care and does not currently meet [facility] involuntary commitment criteria.  Substance Abuse History: Current substance abuse: alcohol abuse - not presently, only drinks 1 day/week.  THC daily  Past Psychiatric History:   [facility]; therapy about 3 years ago History of Psych Hospitalization: none Psychological Testing: none    Medications: Current Outpatient Medications  Medication Sig Dispense Refill   cetirizine (ZYRTEC ALLERGY) 10 MG tablet Take 1 tablet (10 mg total) by mouth daily. 30 tablet 0   divalproex (DEPAKOTE ER) 250 MG 24 hr tablet Take 2-3 tablets (500-750 mg total) by mouth in the morning and at bedtime. Take 3 tablets (750 mg) in the morning and take 2 tablets (500 mg) at  night. 540 tablet 0   fluticasone (FLONASE) 50 MCG/ACT nasal spray Place 1 spray into both nostrils daily for 14 days. 16 g 0   guanFACINE (INTUNIV) 2 MG TB24 ER tablet Take 1 tablet (2 mg total) by mouth daily. 90 tablet 0   mirtazapine (REMERON) 15 MG tablet Take 1 tablet (15 mg total) by mouth at bedtime. 90 tablet 0   Multiple Vitamin (MULTIVITAMIN WITH MINERALS) TABS tablet Take 1 tablet by mouth daily.     omeprazole (PRILOSEC) 40 MG capsule TAKE 1 CAPSULE BY MOUTH ONCE DAILY IN THE MORNING 30 capsule 1   QUEtiapine (SEROQUEL) 400 MG tablet Take one tablet at bedtime. 90 tablet 0   No current facility-administered medications for this visit.    Subjective:  Patient arrived on time for today's session.  Patient shared progress.  He stated that he did attend his father's birthday dinner with his wife.  He stated that it went well overall, his wife did speak to his mother briefly about feeling "out of the loop" in terms of family and her calling them less.  Patient stated that he understands how busy his mother is as she watches her grandchildren and works 20 to 25 hours part-time [employer] week.  He stated that he and his wife went on short weekend vacation which was pleasant.  He stated they continue to have issues in their relationship of noncommitability  regarding intimacy which patient stated makes him question their future together.  He still thinks at some point they could benefit from couples therapy and is willing to go.  He continues to maintain a boundary with family, not communicating with his sister due to previous events about a year ago.  Facilitated his identifying feelings where he continues to carry guilt.  Worked with patient to identify these associated thoughts and how they impact his mood. He continues to cope and care for himself by working out a few days a week which he feels is a good outlet for stress management as well.  Interventions: Motivational interviewing, supportive  therapy  Diagnoses:    ICD-10-CM   1. Major depressive disorder, recurrent episode, moderate (HCC)  F33.1          Plan: Patient is to use coping skills as identified to help manage / decrease symptoms.  Continue to use his support system, his wife, father and other friendships.   Long-term goal:  Reduce overall level, frequency, and intensity emotional distress that manifests as depression, anger outbursts.  Short-term goal: To identify and process feelings related to the disappointment of past painful events that increase worthless feelings.                   Verbally express understanding of the relationship between depressed mood and repression of feelings.                              Identify and process thoughts and feelings relate when he becomes more increasingly irritable and angry         Improve motivation by identifying and exploring outlets for enjoyment and self-care  Assessment of progress:  progressing     Waldron Session, [facility]

## 2023-01-23 ENCOUNTER — Ambulatory Visit (INDEPENDENT_AMBULATORY_CARE_PROVIDER_SITE_OTHER): Payer: 59 | Admitting: Mental Health

## 2023-01-23 DIAGNOSIS — Z0389 Encounter for observation for other suspected diseases and conditions ruled out: Secondary | ICD-10-CM

## 2023-01-23 NOTE — Progress Notes (Signed)
Charge for no show 01/23/23.

## 2023-01-29 ENCOUNTER — Ambulatory Visit (INDEPENDENT_AMBULATORY_CARE_PROVIDER_SITE_OTHER): Payer: 59 | Admitting: Adult Health

## 2023-01-29 ENCOUNTER — Encounter: Payer: Self-pay | Admitting: Adult Health

## 2023-01-29 DIAGNOSIS — F331 Major depressive disorder, recurrent, moderate: Secondary | ICD-10-CM

## 2023-01-29 DIAGNOSIS — F909 Attention-deficit hyperactivity disorder, unspecified type: Secondary | ICD-10-CM

## 2023-01-29 DIAGNOSIS — F4310 Post-traumatic stress disorder, unspecified: Secondary | ICD-10-CM | POA: Diagnosis not present

## 2023-01-29 DIAGNOSIS — F3162 Bipolar disorder, current episode mixed, moderate: Secondary | ICD-10-CM

## 2023-01-29 DIAGNOSIS — F319 Bipolar disorder, unspecified: Secondary | ICD-10-CM

## 2023-01-29 DIAGNOSIS — Z79899 Other long term (current) drug therapy: Secondary | ICD-10-CM

## 2023-01-29 DIAGNOSIS — G4700 Insomnia, unspecified: Secondary | ICD-10-CM | POA: Diagnosis not present

## 2023-01-29 DIAGNOSIS — F411 Generalized anxiety disorder: Secondary | ICD-10-CM

## 2023-01-29 MED ORDER — QUETIAPINE FUMARATE 400 MG PO TABS: ORAL_TABLET | ORAL | 1 refills | Status: DC

## 2023-01-29 MED ORDER — DIVALPROEX SODIUM ER 250 MG PO TB24: ORAL_TABLET | ORAL | 5 refills | Status: DC

## 2023-01-29 MED ORDER — MIRTAZAPINE 15 MG PO TABS: 15.0000 mg | ORAL_TABLET | Freq: Every day | ORAL | 1 refills | Status: DC

## 2023-01-29 MED ORDER — GUANFACINE HCL ER 2 MG PO TB24: 2.0000 mg | ORAL_TABLET | Freq: Every day | ORAL | 1 refills | Status: DC

## 2023-01-29 NOTE — Progress Notes (Signed)
Jordan Christian [id number] June 06, 1996 27 y.o.  Subjective:   Patient ID:  Jordan Christian is a 26 y.o. (DOB 1996-06-21) male.  Chief Complaint: No chief complaint on file.   HPI Saba Boese Dekay presents to the office today for follow-up of MDD, GAD, ADHD, Mood disorder and insomnia.  Describes mood today as "ok". Pleasant. Reports tearfulness. Mood symptoms - reports depression, anxiety and irritability - "at times". Denies panic attacks. Reports decreased outbursts - reports one recently in work setting. Mood is variable. Taking medications as prescribed and feels they are helpful. He and wife working on relationship. Varying interest and motivation. Taking medication as prescribed and feels they are helping.   Energy levels stable. Active, has a regular exercise routine - 4 to 5 times a week. Enjoys some usual interests and activities. Lives with wife, 6 cats and 1 dog. Wife's parents live in [place]. Spending time with family. Appetite adequate. Weight gain - 205 pounds. Sleeps better some nights than others. Averages 5 to 6 hours. Focus and concentration - "pretty good". Completing tasks. Managing aspects of household. Working full time. Denies SI or HI.  Denies AH or VH. Denies self harm. Smoking THC every day. Decreased alcohol use - once or twice a week.  Working with a therapist - Elio Forget.  Previous medication trials: Celexa, Remeron, Concerta, Intuniv, Stratera, Wellbutrin   PHQ2-9    Flowsheet Row Office Visit from 03/12/2017 in Primary Care at [facility] Total Score 0      Flowsheet Row ED from 04/18/2021 in [facility] at [facility] RISK CATEGORY No Risk        Review of Systems:  Review of Systems  Musculoskeletal:  Negative for gait problem.  Neurological:  Negative for tremors.  Psychiatric/Behavioral:         Please refer to HPI    Medications: I have reviewed the patient's current medications.  Current Outpatient  Medications  Medication Sig Dispense Refill   cetirizine (ZYRTEC ALLERGY) 10 MG tablet Take 1 tablet (10 mg total) by mouth daily. 30 tablet 0   divalproex (DEPAKOTE ER) 250 MG 24 hr tablet Take 2-3 tablets (500-750 mg total) by mouth in the morning and at bedtime. Take 3 tablets (750 mg) in the morning and take 2 tablets (500 mg) at night. 540 tablet 0   fluticasone (FLONASE) 50 MCG/ACT nasal spray Place 1 spray into both nostrils daily for 14 days. 16 g 0   guanFACINE (INTUNIV) 2 MG TB24 ER tablet Take 1 tablet (2 mg total) by mouth daily. 90 tablet 0   mirtazapine (REMERON) 15 MG tablet Take 1 tablet (15 mg total) by mouth at bedtime. 90 tablet 0   Multiple Vitamin (MULTIVITAMIN WITH MINERALS) TABS tablet Take 1 tablet by mouth daily.     omeprazole (PRILOSEC) 40 MG capsule TAKE 1 CAPSULE BY MOUTH ONCE DAILY IN THE MORNING 30 capsule 1   QUEtiapine (SEROQUEL) 400 MG tablet Take one tablet at bedtime. 90 tablet 0   No current facility-administered medications for this visit.    Medication Side Effects: None  Allergies: No Known Allergies  Past Medical History:  Diagnosis Date   ADHD    Depression    GERD (gastroesophageal reflux disease)     Past Medical History, Surgical history, Social history, and Family history were reviewed and updated as appropriate.   Please see review of systems for further details on the patient's review from today.   Objective:  Physical Exam:  There were no vitals taken for this visit.  Physical Exam Constitutional:      General: He is not in acute distress. Musculoskeletal:        General: No deformity.  Neurological:     Mental Status: He is alert and oriented to person, place, and time.     Coordination: Coordination normal.  Psychiatric:        Attention and Perception: Attention and perception normal. He does not perceive auditory or visual hallucinations.        Mood and Affect: Mood normal. Mood is not anxious or depressed. Affect is  not labile, blunt, angry or inappropriate.        Speech: Speech normal.        Behavior: Behavior normal.        Thought Content: Thought content normal. Thought content is not paranoid or delusional. Thought content does not include homicidal or suicidal ideation. Thought content does not include homicidal or suicidal plan.        Cognition and Memory: Cognition and memory normal.        Judgment: Judgment normal.     Comments: Insight intact     Lab Review:     Component Value Date/Time   NA 141 07/24/2022 1527   NA 142 12/27/2021 1423   K 4.0 07/24/2022 1527   CL 106 07/24/2022 1527   CO2 26 07/24/2022 1527   GLUCOSE 95 07/24/2022 1527   BUN 7 07/24/2022 1527   BUN 16 12/27/2021 1423   CREATININE 0.89 07/24/2022 1527   CALCIUM 9.2 07/24/2022 1527   PROT 7.1 07/24/2022 1527   PROT 7.0 12/27/2021 1423   ALBUMIN 4.5 07/24/2022 1527   ALBUMIN 5.1 12/27/2021 1423   AST 56 (H) 07/24/2022 1527   ALT 51 (H) 07/24/2022 1527   ALKPHOS 45 07/24/2022 1527   BILITOT 0.4 07/24/2022 1527   BILITOT <0.2 12/27/2021 1423   GFRNONAA >60 07/24/2022 1527   GFRAA 137 03/14/2017 1350       Component Value Date/Time   WBC 6.1 07/24/2022 1527   RBC 4.68 07/24/2022 1527   HGB 14.0 07/24/2022 1527   HGB 13.8 07/06/2017 1615   HCT 40.9 07/24/2022 1527   HCT 40.3 07/06/2017 1615   PLT 246 07/24/2022 1527   PLT 224 07/06/2017 1615   MCV 87.4 07/24/2022 1527   MCV 88 07/06/2017 1615   MCH 29.9 07/24/2022 1527   MCHC 34.2 07/24/2022 1527   RDW 12.6 07/24/2022 1527   RDW 13.5 07/06/2017 1615   LYMPHSABS 2.1 07/24/2022 1527   LYMPHSABS 1.8 07/06/2017 1615   MONOABS 0.4 07/24/2022 1527   EOSABS 0.1 07/24/2022 1527   EOSABS 0.2 07/06/2017 1615   BASOSABS 0.0 07/24/2022 1527   BASOSABS 0.0 07/06/2017 1615    No results found for: "POCLITH", "LITHIUM"   Lab Results  Component Value Date   VALPROATE 86 07/24/2022     .res Assessment: Plan:    Plan:  PDMP reviewed  Intuniv 2mg   at hs. Remeron 15mg  at hs - recently added by other provider. Seroquel 400mg  at bedtime. Depakote to 500mg  to 750mg  BID - recently increased by other provider.  Discussed potential benefits, risks, and side effects of Depakote and need for periodic lab monitoring to assess for potential adverse effects to include obtaining CBC and LFTs. Last labs 07/31/2022.  RTC 3 months  Patient advised to contact office with any questions, adverse effects, or acute worsening in signs and symptoms.  Discussed potential metabolic side effects associated with atypical antipsychotics, as well as potential risk for movement side effects. Advised pt to contact office if movement side effects occur.   There are no diagnoses linked to this encounter.   Please see After Visit Summary for patient specific instructions.  Future Appointments  Date Time Provider Department Center  01/29/2023  5:00 PM Carine Nordgren, Thereasa Solo, NP CP-CP None  02/06/2023  8:00 AM Waldron Session, [facility] CP-[facility] None  02/19/2023  8:00 AM Waldron Session, [facility] CP-[facility] None    No orders of the defined types were placed in this encounter.   -------------------------------

## 2023-02-06 ENCOUNTER — Ambulatory Visit (INDEPENDENT_AMBULATORY_CARE_PROVIDER_SITE_OTHER): Payer: 59 | Admitting: Mental Health

## 2023-02-06 DIAGNOSIS — F3162 Bipolar disorder, current episode mixed, moderate: Secondary | ICD-10-CM | POA: Diagnosis not present

## 2023-02-06 NOTE — Progress Notes (Signed)
[facility] psychotherapy note  Name: MARKICE TORBERT Date: 02/06/23 MRN: [id number] DOB: [birth year] PCP: Patient, No Pcp Per  Time spent: 50 minutes  Treatment:   ind. therapy  Mental Status Exam:    Appearance:    Casual     Behavior:   Appropriate  Motor:   WNL  Speech/Language:    Clear and Coherent  Affect:   Full range   Mood:   Sad, pleasant  Thought process:   Logical, linear, goal directed  Thought content:     WNL  Sensory/Perceptual disturbances:     none  Orientation:   x4  Attention:   Good  Concentration:   Good  Memory:   Intact  Fund of knowledge:    Consistent with age and development  Insight:     Good  Judgment:    Good  Impulse Control:   Good     Reported Symptoms:  mood swings, anger, anxiety, depression  Risk Assessment: Danger to Self:  No Self-injurious Behavior: No Danger to Others: No Duty to Warn:no Physical Aggression / Violence:No  Access to Firearms a concern: No  Gang Involvement:No  Patient / guardian was educated about steps to take if suicide or homicide risk level increases between visits: yes While future psychiatric events cannot be accurately predicted, the patient does not currently require acute inpatient psychiatric care and does not currently meet [facility] involuntary commitment criteria.    Medications: Current Outpatient Medications  Medication Sig Dispense Refill   cetirizine (ZYRTEC ALLERGY) 10 MG tablet Take 1 tablet (10 mg total) by mouth daily. 30 tablet 0   divalproex (DEPAKOTE ER) 250 MG 24 hr tablet Take 3 tablets (750 mg) in the morning and take 2 tablets (500 mg) at night. 150 tablet 5   fluticasone (FLONASE) 50 MCG/ACT nasal spray Place 1 spray into both nostrils daily for 14 days. 16 g 0   guanFACINE (INTUNIV) 2 MG TB24 ER tablet Take 1 tablet (2 mg total) by mouth daily. 90 tablet 1   mirtazapine (REMERON) 15 MG tablet Take 1 tablet (15 mg total) by mouth at bedtime. 90 tablet 1   Multiple  Vitamin (MULTIVITAMIN WITH MINERALS) TABS tablet Take 1 tablet by mouth daily.     omeprazole (PRILOSEC) 40 MG capsule TAKE 1 CAPSULE BY MOUTH ONCE DAILY IN THE MORNING 30 capsule 1   QUEtiapine (SEROQUEL) 400 MG tablet Take one tablet at bedtime. 90 tablet 1   No current facility-administered medications for this visit.    Subjective:  Patient arrived on time for today's session.  Assess progress since last visit.  Patient shared work-related stress, specifically with one of his supervisors.  Patient identified often feeling "picked on" by the supervisor.  He stated that others have had issues with the supervisor as well.  He identified wanting to keep this job as it is increased income than his last job.  He shared how he is trying to communicate with the supervisor and we collaboratively explored ways to do so, maintaining professionalism.  Explored family relationships where he continues to have strain in the relationship with his sister.  Provide support and assisting him in clarifying feelings related to past behaviors that caused the strain most recently in the relationship about 2 years ago.  Encouraged patient to consider journaling to further express his feelings.  He shared he continues to have strain in his marital relationship, at this point he is not optimistic about their future together citing some incompatibilities.  Interventions: Motivational interviewing, supportive therapy  Diagnoses:    ICD-10-CM   1. Bipolar 1 disorder, mixed, moderate (HCC)  F31.62           Plan: Patient is to use coping skills as identified to help manage / decrease symptoms.  Continue to use his support system, his wife, father and other friendships.   Long-term goal:  Reduce overall level, frequency, and intensity emotional distress that manifests as depression, anger outbursts for at least 3 consecutive months per patient report.   Short-term goal: To identify and process feelings related to the  disappointment of past painful events that increase worthless feelings.                   Verbally express understanding of the relationship between depressed mood and repression of feelings.                              Identify and process thoughts and feelings relate when he becomes more increasingly irritable and angry         Improve motivation by identifying and exploring outlets for enjoyment and self-care  Assessment of progress:  progressing     Waldron Session, [facility]

## 2023-02-19 ENCOUNTER — Ambulatory Visit: Payer: 59 | Admitting: Mental Health

## 2023-02-26 ENCOUNTER — Telehealth: Payer: Self-pay | Admitting: Adult Health

## 2023-02-26 NOTE — Telephone Encounter (Signed)
Received ADA Accomodations Request for Medical Certification. Given to [facility] 6/20

## 2023-03-02 ENCOUNTER — Telehealth (INDEPENDENT_AMBULATORY_CARE_PROVIDER_SITE_OTHER): Payer: 59 | Admitting: Adult Health

## 2023-03-02 ENCOUNTER — Encounter: Payer: Self-pay | Admitting: Adult Health

## 2023-03-02 DIAGNOSIS — F4310 Post-traumatic stress disorder, unspecified: Secondary | ICD-10-CM

## 2023-03-02 DIAGNOSIS — G4700 Insomnia, unspecified: Secondary | ICD-10-CM | POA: Diagnosis not present

## 2023-03-02 DIAGNOSIS — F3162 Bipolar disorder, current episode mixed, moderate: Secondary | ICD-10-CM

## 2023-03-02 DIAGNOSIS — F411 Generalized anxiety disorder: Secondary | ICD-10-CM | POA: Diagnosis not present

## 2023-03-02 NOTE — Progress Notes (Signed)
Jordan Christian [id number] [birth year] 27 y.o.  Virtual Visit via Video Note  I connected with pt @ on 03/02/23 at  3:20 PM EDT by a video enabled telemedicine application and verified that I am speaking with the correct person using two identifiers.   I discussed the limitations of evaluation and management by telemedicine and the availability of in person appointments. The patient expressed understanding and agreed to proceed.  I discussed the assessment and treatment plan with the patient. The patient was provided an opportunity to ask questions and all were answered. The patient agreed with the plan and demonstrated an understanding of the instructions.   The patient was advised to call back or seek an in-person evaluation if the symptoms worsen or if the condition fails to improve as anticipated.  I provided 25 minutes of non-face-to-face time during this encounter.  The patient was located at home.  The provider was located at [facility].   Dorothyann Gibbs, NP   Subjective:   Patient ID:  Jordan Christian is a 27 y.o. (DOB [birth year]) male.  Chief Complaint: No chief complaint on file.   HPI Jordan Christian presents for follow-up of MDD, GAD, ADHD, Mood disorder and insomnia.  Describes mood today as "ok". Pleasant. Reports tearfulness. Mood symptoms - reports decreased depression and anxiety. Reports increased irritability - "more so lately". Reports one panic attack while at work. Reports recent outbursts - "being snippy". Mood is "fairly" consistent, but has periods of switching back and forth.Stating "I feel like I'm going in a better direction". Taking medications as prescribed and feels they are helpful. He and wife working on relationship - on a list for couples therapy. Varying interest and motivation. Taking medication as prescribed and feels they are helping.   Energy levels stable. Active, does not have a regular exercise routine, but plans to restart. Enjoys some  usual interests and activities. Lives with wife, 6 cats and 1 dog. Wife's parents live in [place]. Spending time with family. Appetite adequate. Weight gain - 210 to 215 pounds. Sleeps better some nights than others. Averages 5 to 6 hours. Focus and concentration stable. Completing tasks. Managing aspects of household. Working full time. Denies SI or HI.  Denies AH or VH. Denies self harm. Smoking THC every day. Decreased alcohol use - once or twice a week.  Working with a therapist - Elio Forget.  Previous medication trials: Celexa, Remeron, Concerta, Intuniv, Stratera, Wellbutrin   Review of Systems:  Review of Systems  Musculoskeletal:  Negative for gait problem.  Neurological:  Negative for tremors.  Psychiatric/Behavioral:         Please refer to HPI    Medications: I have reviewed the patient's current medications.  Current Outpatient Medications  Medication Sig Dispense Refill   cetirizine (ZYRTEC ALLERGY) 10 MG tablet Take 1 tablet (10 mg total) by mouth daily. 30 tablet 0   divalproex (DEPAKOTE ER) 250 MG 24 hr tablet Take 3 tablets (750 mg) in the morning and take 2 tablets (500 mg) at night. 150 tablet 5   fluticasone (FLONASE) 50 MCG/ACT nasal spray Place 1 spray into both nostrils daily for 14 days. 16 g 0   guanFACINE (INTUNIV) 2 MG TB24 ER tablet Take 1 tablet (2 mg total) by mouth daily. 90 tablet 1   mirtazapine (REMERON) 15 MG tablet Take 1 tablet (15 mg total) by mouth at bedtime. 90 tablet 1   Multiple Vitamin (MULTIVITAMIN WITH MINERALS) TABS tablet Take 1  tablet by mouth daily.     omeprazole (PRILOSEC) 40 MG capsule TAKE 1 CAPSULE BY MOUTH ONCE DAILY IN THE MORNING 30 capsule 1   QUEtiapine (SEROQUEL) 400 MG tablet Take one tablet at bedtime. 90 tablet 1   No current facility-administered medications for this visit.    Medication Side Effects: None  Allergies: No Known Allergies  Past Medical History:  Diagnosis Date   ADHD    Depression     GERD (gastroesophageal reflux disease)     Family History  Adopted: Yes  Problem Relation Age of Onset   Bipolar disorder Mother    Bipolar disorder Sister    Alcohol abuse Father     Social History   Socioeconomic History   Marital status: Single    Spouse name: Not on file   Number of children: Not on file   Years of education: Not on file   Highest education level: Not on file  Occupational History   Occupation: Personnel officer  Tobacco Use   Smoking status: Former    Years: 3.5    Types: Cigarettes, E-cigarettes   Smokeless tobacco: Never  Building services engineer Use: Every day  Substance and Sexual Activity   Alcohol use: No    Alcohol/week: 1.0 - 2.0 standard drink of alcohol    Types: 1 - 2 Cans of beer per week    Comment: occ   Drug use: Yes    Types: Marijuana    Comment: occ   Sexual activity: Yes    Partners: Female    Birth control/protection: None  Other Topics Concern   Not on file  Social History Narrative   Not on file   Social Determinants of Health   Financial Resource Strain: Not on file  Food Insecurity: Not on file  Transportation Needs: Not on file  Physical Activity: Not on file  Stress: Not on file  Social Connections: Not on file  Intimate Partner Violence: Not on file    Past Medical History, Surgical history, Social history, and Family history were reviewed and updated as appropriate.   Please see review of systems for further details on the patient's review from today.   Objective:   Physical Exam:  There were no vitals taken for this visit.  Physical Exam Constitutional:      General: He is not in acute distress. Musculoskeletal:        General: No deformity.  Neurological:     Mental Status: He is alert and oriented to person, place, and time.     Coordination: Coordination normal.  Psychiatric:        Attention and Perception: Attention and perception normal. He does not perceive auditory or visual hallucinations.         Mood and Affect: Mood normal. Mood is not anxious or depressed. Affect is not labile, blunt, angry or inappropriate.        Speech: Speech normal.        Behavior: Behavior normal.        Thought Content: Thought content normal. Thought content is not paranoid or delusional. Thought content does not include homicidal or suicidal ideation. Thought content does not include homicidal or suicidal plan.        Cognition and Memory: Cognition and memory normal.        Judgment: Judgment normal.     Comments: Insight intact     Lab Review:     Component Value Date/Time   NA 141 07/24/2022  1527   NA 142 12/27/2021 1423   K 4.0 07/24/2022 1527   CL 106 07/24/2022 1527   CO2 26 07/24/2022 1527   GLUCOSE 95 07/24/2022 1527   BUN 7 07/24/2022 1527   BUN 16 12/27/2021 1423   CREATININE 0.89 07/24/2022 1527   CALCIUM 9.2 07/24/2022 1527   PROT 7.1 07/24/2022 1527   PROT 7.0 12/27/2021 1423   ALBUMIN 4.5 07/24/2022 1527   ALBUMIN 5.1 12/27/2021 1423   AST 56 (H) 07/24/2022 1527   ALT 51 (H) 07/24/2022 1527   ALKPHOS 45 07/24/2022 1527   BILITOT 0.4 07/24/2022 1527   BILITOT <0.2 12/27/2021 1423   GFRNONAA >60 07/24/2022 1527   GFRAA 137 03/14/2017 1350       Component Value Date/Time   WBC 6.1 07/24/2022 1527   RBC 4.68 07/24/2022 1527   HGB 14.0 07/24/2022 1527   HGB 13.8 07/06/2017 1615   HCT 40.9 07/24/2022 1527   HCT 40.3 07/06/2017 1615   PLT 246 07/24/2022 1527   PLT 224 07/06/2017 1615   MCV 87.4 07/24/2022 1527   MCV 88 07/06/2017 1615   MCH 29.9 07/24/2022 1527   MCHC 34.2 07/24/2022 1527   RDW 12.6 07/24/2022 1527   RDW 13.5 07/06/2017 1615   LYMPHSABS 2.1 07/24/2022 1527   LYMPHSABS 1.8 07/06/2017 1615   MONOABS 0.4 07/24/2022 1527   EOSABS 0.1 07/24/2022 1527   EOSABS 0.2 07/06/2017 1615   BASOSABS 0.0 07/24/2022 1527   BASOSABS 0.0 07/06/2017 1615    No results found for: "POCLITH", "LITHIUM"   Lab Results  Component Value Date   VALPROATE 86 07/24/2022      .res Assessment: Plan:    Plan:  PDMP reviewed  Intuniv 2mg  at hs. Remeron 15mg  at hs - recently added by other provider. Seroquel 400mg  at bedtime. Depakote to 500mg  to 750mg  BID - recently increased by other provider.  Discussed potential benefits, risks, and side effects of Depakote and need for periodic lab monitoring to assess for potential adverse effects to include obtaining CBC and LFTs. Last labs 07/31/2022.  Discussed accommodations for the work setting - getting to work late - medications not wearing off so he can get to work on time. Trying to be consistent and working with medications.  RTC 3 months  Patient advised to contact office with any questions, adverse effects, or acute worsening in signs and symptoms.  Discussed potential metabolic side effects associated with atypical antipsychotics, as well as potential risk for movement side effects. Advised pt to contact office if movement side effects occur.   There are no diagnoses linked to this encounter.   Please see After Visit Summary for patient specific instructions.  Future Appointments  Date Time Provider Department Center  03/02/2023  3:20 PM Roosevelt Bisher, Thereasa Solo, NP CP-CP None  03/20/2023  8:00 AM Waldron Session, [facility] None  04/03/2023  8:00 AM Waldron Session, [facility] None  04/17/2023  8:00 AM Waldron Session, [facility] None  05/04/2023  5:00 PM Jackelin Correia, Thereasa Solo, NP [facility] None    No orders of the defined types were placed in this encounter.     -------------------------------

## 2023-03-05 ENCOUNTER — Ambulatory Visit: Payer: 59 | Admitting: Mental Health

## 2023-03-08 DIAGNOSIS — Z0289 Encounter for other administrative examinations: Secondary | ICD-10-CM

## 2023-03-09 NOTE — Telephone Encounter (Signed)
Paper work completed and given to Almira Coaster to review and sign, then will fax according to his paper work

## 2023-03-17 LAB — CMP AND LIVER
ALT: 33 IU/L (ref 0–44)
AST: 27 IU/L (ref 0–40)
Albumin: 4.8 g/dL (ref 4.3–5.2)
Alkaline Phosphatase: 56 IU/L (ref 44–121)
BUN: 10 mg/dL (ref 6–20)
Bilirubin Total: <0.2 mg/dL (ref 0.0–1.2)
Bilirubin, Direct: <0.1 mg/dL (ref 0.00–0.40)
CO2: 23 mmol/L (ref 20–29)
Calcium: 9.2 mg/dL (ref 8.7–10.2)
Chloride: 101 mmol/L (ref 96–106)
Creatinine, Ser: 1 mg/dL (ref 0.76–1.27)
Glucose: 99 mg/dL (ref 70–99)
Potassium: 4.7 mmol/L (ref 3.5–5.2)
Sodium: 140 mmol/L (ref 134–144)
Total Protein: 7.1 g/dL (ref 6.0–8.5)
eGFR: 106 mL/min/{1.73_m2} (ref >59)

## 2023-03-17 LAB — VALPROIC ACID LEVEL: Valproic Acid Lvl: 50 ug/mL (ref 50–100)

## 2023-03-17 NOTE — Progress Notes (Signed)
Please advise patient labs are available for review. Liver enzyme are elevated - follow up with PCP.

## 2023-03-20 ENCOUNTER — Ambulatory Visit (INDEPENDENT_AMBULATORY_CARE_PROVIDER_SITE_OTHER): Payer: 59 | Admitting: Mental Health

## 2023-03-20 DIAGNOSIS — F3162 Bipolar disorder, current episode mixed, moderate: Secondary | ICD-10-CM | POA: Diagnosis not present

## 2023-03-20 NOTE — Progress Notes (Signed)
[facility] psychotherapy note  Name: Jordan Christian Date: 03/20/23 MRN: [id number] DOB: 1996/09/05 PCP: Patient, No Pcp Per  Time spent: 50 minutes  Treatment:   ind. therapy  Mental Status Exam:    Appearance:    Casual     Behavior:   Appropriate  Motor:   WNL  Speech/Language:    Clear and Coherent  Affect:   Full range   Mood:   Sad, pleasant  Thought process:   Logical, linear, goal directed  Thought content:     WNL  Sensory/Perceptual disturbances:     none  Orientation:   x4  Attention:   Good  Concentration:   Good  Memory:   Intact  Fund of knowledge:    Consistent with age and development  Insight:     Good  Judgment:    Good  Impulse Control:   Good     Reported Symptoms:  mood swings, anger, anxiety, depression  Risk Assessment: Danger to Self:  No Self-injurious Behavior: No Danger to Others: No Duty to Warn:no Physical Aggression / Violence:No  Access to Firearms a concern: No  Gang Involvement:No  Patient / guardian was educated about steps to take if suicide or homicide risk level increases between visits: yes While future psychiatric events cannot be accurately predicted, the patient does not currently require acute inpatient psychiatric care and does not currently meet [facility] involuntary commitment criteria.    Medications: Current Outpatient Medications  Medication Sig Dispense Refill   cetirizine (ZYRTEC ALLERGY) 10 MG tablet Take 1 tablet (10 mg total) by mouth daily. 30 tablet 0   divalproex (DEPAKOTE ER) 250 MG 24 hr tablet Take 3 tablets (750 mg) in the morning and take 2 tablets (500 mg) at night. 150 tablet 5   fluticasone (FLONASE) 50 MCG/ACT nasal spray Place 1 spray into both nostrils daily for 14 days. 16 g 0   guanFACINE (INTUNIV) 2 MG TB24 ER tablet Take 1 tablet (2 mg total) by mouth daily. 90 tablet 1   mirtazapine (REMERON) 15 MG tablet Take 1 tablet (15 mg total) by mouth at bedtime. 90 tablet 1   Multiple  Vitamin (MULTIVITAMIN WITH MINERALS) TABS tablet Take 1 tablet by mouth daily.     omeprazole (PRILOSEC) 40 MG capsule TAKE 1 CAPSULE BY MOUTH ONCE DAILY IN THE MORNING 30 capsule 1   QUEtiapine (SEROQUEL) 400 MG tablet Take one tablet at bedtime. 90 tablet 1   No current facility-administered medications for this visit.    Subjective:  Patient arrived for today's session on time.  Assessed progress where he stated that he has been able to have some positive changes at work, stated that one of his supervisors apologized him for how he spoke to him a few weeks ago.  He stated he went on to share how he spoke to human resources and plans to avoid a couple of supervisors he has had some issues with at work.  He stated that he has had a few doctor appointments and this has caused some stress at work, continues to have days where he may come in late due to challenges with his sleep, related medication concerns.  He stated that paperwork was sent and he hopes that will alleviate this issue and allow him accommodations.  He shared how he has worked to try and manage his reactions at work with less anger.  He stated that he and his wife have engaged in couples counseling as he has been  asking her for the past few months.  At this point, he continues to remain doubtful about their future.  Provide support as he processed feelings of concern and sadness regarding his father's health, worried that at some point he is going to have a major medical event due to his being morbidly obese and his refusal to make changes toward living healthier.  Interventions: Motivational interviewing, supportive therapy  Diagnoses:    ICD-10-CM   1. Bipolar 1 disorder, mixed, moderate (HCC)  F31.62            Plan: Patient is to use coping skills as identified to help manage / decrease symptoms.  Continue to use his support system, his wife, father and other friendships.   Long-term goal:  Reduce overall level, frequency,  and intensity emotional distress that manifests as depression, anger outbursts for at least 3 consecutive months per patient report.   Short-term goal: To identify and process feelings related to the disappointment of past painful events that increase worthless feelings.                   Verbally express understanding of the relationship between depressed mood and repression of feelings.                              Identify and process thoughts and feelings relate when he becomes more increasingly irritable and angry         Improve motivation by identifying and exploring outlets for enjoyment and self-care  Assessment of progress:  progressing     Waldron Session, [facility]

## 2023-04-03 ENCOUNTER — Ambulatory Visit (INDEPENDENT_AMBULATORY_CARE_PROVIDER_SITE_OTHER): Payer: 59 | Admitting: Mental Health

## 2023-04-03 DIAGNOSIS — F3162 Bipolar disorder, current episode mixed, moderate: Secondary | ICD-10-CM
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
## 2023-04-17 ENCOUNTER — Ambulatory Visit: Payer: 59 | Admitting: Mental Health

## 2023-05-01 ENCOUNTER — Telehealth (INDEPENDENT_AMBULATORY_CARE_PROVIDER_SITE_OTHER): Payer: 59 | Admitting: Mental Health

## 2023-05-01 DIAGNOSIS — F3162 Bipolar disorder, current episode mixed, moderate: Secondary | ICD-10-CM | POA: Diagnosis not present
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
## 2023-05-04 ENCOUNTER — Ambulatory Visit (INDEPENDENT_AMBULATORY_CARE_PROVIDER_SITE_OTHER): Payer: Self-pay | Admitting: Adult Health

## 2023-05-04 DIAGNOSIS — Z0389 Encounter for observation for other suspected diseases and conditions ruled out: Secondary | ICD-10-CM

## 2023-05-04 NOTE — Progress Notes (Signed)
Patient no show appointment. ? ?

## 2023-05-15 ENCOUNTER — Ambulatory Visit (INDEPENDENT_AMBULATORY_CARE_PROVIDER_SITE_OTHER): Payer: 59 | Admitting: Mental Health

## 2023-05-15 NOTE — Progress Notes (Signed)
No chg for missed session 05/14/23.

## 2023-05-29 ENCOUNTER — Ambulatory Visit (INDEPENDENT_AMBULATORY_CARE_PROVIDER_SITE_OTHER): Payer: Self-pay | Admitting: Mental Health

## 2023-05-29 DIAGNOSIS — Z0389 Encounter for observation for other suspected diseases and conditions ruled out: Secondary | ICD-10-CM

## 2023-05-29 NOTE — Progress Notes (Signed)
Chg for no show 05/29/23

## 2023-07-30 ENCOUNTER — Other Ambulatory Visit: Payer: Self-pay | Admitting: Adult Health

## 2023-07-30 ENCOUNTER — Telehealth: Payer: Self-pay

## 2023-07-30 DIAGNOSIS — G4700 Insomnia, unspecified: Secondary | ICD-10-CM

## 2023-07-30 DIAGNOSIS — F909 Attention-deficit hyperactivity disorder, unspecified type: Secondary | ICD-10-CM

## 2023-07-30 DIAGNOSIS — F4310 Post-traumatic stress disorder, unspecified: Secondary | ICD-10-CM

## 2023-07-30 NOTE — Telephone Encounter (Signed)
Please schedule pt an appt was due back in 3 months lv 06/24

## 2023-07-31 NOTE — Telephone Encounter (Signed)
LM for pt schedule

## 2023-08-26 ENCOUNTER — Other Ambulatory Visit: Payer: Self-pay | Admitting: Adult Health

## 2023-08-26 DIAGNOSIS — G4700 Insomnia, unspecified: Secondary | ICD-10-CM

## 2023-08-26 DIAGNOSIS — F4310 Post-traumatic stress disorder, unspecified: Secondary | ICD-10-CM

## 2023-08-26 NOTE — Telephone Encounter (Signed)
Please schedule pt an appt. LV 06/24 due back in 3 months.

## 2023-08-28 ENCOUNTER — Other Ambulatory Visit: Payer: Self-pay | Admitting: Adult Health

## 2023-08-28 ENCOUNTER — Telehealth: Payer: Self-pay | Admitting: Adult Health

## 2023-08-28 DIAGNOSIS — F909 Attention-deficit hyperactivity disorder, unspecified type: Secondary | ICD-10-CM

## 2023-08-28 NOTE — Telephone Encounter (Signed)
LVM 2:59p on 12/20 to call and schedule follow up appt

## 2023-08-31 NOTE — Telephone Encounter (Signed)
error 

## 2023-09-10 ENCOUNTER — Ambulatory Visit: Payer: 59 | Admitting: Adult Health

## 2023-09-10 ENCOUNTER — Encounter: Payer: Self-pay | Admitting: Adult Health

## 2023-09-10 DIAGNOSIS — G4700 Insomnia, unspecified: Secondary | ICD-10-CM | POA: Diagnosis not present

## 2023-09-10 DIAGNOSIS — F411 Generalized anxiety disorder: Secondary | ICD-10-CM

## 2023-09-10 DIAGNOSIS — F3162 Bipolar disorder, current episode mixed, moderate: Secondary | ICD-10-CM | POA: Diagnosis not present

## 2023-09-10 DIAGNOSIS — Z79899 Other long term (current) drug therapy: Secondary | ICD-10-CM

## 2023-09-10 DIAGNOSIS — F4310 Post-traumatic stress disorder, unspecified: Secondary | ICD-10-CM

## 2023-09-10 MED ORDER — GUANFACINE HCL ER 2 MG PO TB24: 2.0000 mg | ORAL_TABLET | Freq: Every day | ORAL | 1 refills | Status: DC

## 2023-09-10 MED ORDER — DIVALPROEX SODIUM ER 250 MG PO TB24: ORAL_TABLET | ORAL | 5 refills | Status: DC

## 2023-09-10 MED ORDER — QUETIAPINE FUMARATE 400 MG PO TABS: ORAL_TABLET | ORAL | 1 refills | Status: DC

## 2023-09-10 MED ORDER — MIRTAZAPINE 15 MG PO TABS: 15.0000 mg | ORAL_TABLET | Freq: Every day | ORAL | 1 refills | Status: DC

## 2023-09-10 NOTE — Progress Notes (Signed)
 Jordan Christian [id number] 02-20-1996 28 y.o.  Subjective:   Patient ID:  Jordan Christian is a 29 y.o. (DOB Apr 17, 1996) male.  Chief Complaint: No chief complaint on file.   HPI Jordan Christian presents to the office today for follow-up of MDD, GAD, ADHD, Mood disorder and insomnia.  Describes mood today as ok. Pleasant. Reports tearfulness. Mood symptoms - reports depression, anxiety and irritability - more bouncy with emotions. Reports varying interest and motivation.Reports occasional panic attacks. Reports recent outbursts. Mood is variable - up and down. Stating I feel like I'm getting by - moving through life. Taking medications as prescribed and feels they are helpful. Energy levels stable. Active, has a regular exercise routine. Enjoys some usual interests and activities. Lives with wife, 6 cats and 1 dog. Wife's parents live in [place]. Spending time with family. Appetite adequate. Weight gain - 220 pounds. Sleeps better some nights than others - has been out of Seroquel .   Focus and concentration difficulties - more with staying on task. Completing tasks. Managing aspects of household. Out of work currently. Denies SI or HI.  Denies AH or VH. Denies self harm. Smoking THC every day. Decreased alcohol use - once or twice a week.  Working with a therapist - Medford Fischer.  Previous medication trials: Celexa , Remeron , Concerta , Intuniv , [place], Wellbutrin    PHQ2-9    Flowsheet Row Office Visit from 03/12/2017 in [facility] Total Score 0      Flowsheet Row ED from 04/18/2021 in [facility] at [facility] RISK CATEGORY No Risk        Review of Systems:  Review of Systems  Musculoskeletal:  Negative for gait problem.  Neurological:  Negative for tremors.  Psychiatric/Behavioral:         Please refer to HPI    Medications: I have reviewed the patient's current medications.  Current Outpatient Medications  Medication  Sig Dispense Refill   cetirizine  (ZYRTEC  ALLERGY) 10 MG tablet Take 1 tablet (10 mg total) by mouth daily. 30 tablet 0   divalproex  (DEPAKOTE  ER) 250 MG 24 hr tablet Take 3 tablets (750 mg) in the morning and take 2 tablets (500 mg) at night. 150 tablet 5   fluticasone  (FLONASE ) 50 MCG/ACT nasal spray Place 1 spray into both nostrils daily for 14 days. 16 g 0   guanFACINE  (INTUNIV ) 2 MG TB24 ER tablet Take 1 tablet (2 mg total) by mouth daily. 90 tablet 1   mirtazapine  (REMERON ) 15 MG tablet Take 1 tablet (15 mg total) by mouth at bedtime. 90 tablet 1   Multiple Vitamin (MULTIVITAMIN WITH MINERALS) TABS tablet Take 1 tablet by mouth daily.     omeprazole  (PRILOSEC) 40 MG capsule TAKE 1 CAPSULE BY MOUTH ONCE DAILY IN THE MORNING 30 capsule 1   QUEtiapine  (SEROQUEL ) 400 MG tablet Take one tablet at bedtime. 90 tablet 1   No current facility-administered medications for this visit.    Medication Side Effects: None  Allergies: No Known Allergies  Past Medical History:  Diagnosis Date   ADHD    Depression    GERD (gastroesophageal reflux disease)     Past Medical History, Surgical history, Social history, and Family history were reviewed and updated as appropriate.   Please see review of systems for further details on the patient's review from today.   Objective:   Physical Exam:  There were no vitals taken for this visit.  Physical Exam Constitutional:  General: He is not in acute distress. Musculoskeletal:        General: No deformity.  Neurological:     Mental Status: He is alert and oriented to person, place, and time.     Coordination: Coordination normal.  Psychiatric:        Attention and Perception: Attention and perception normal. He does not perceive auditory or visual hallucinations.        Mood and Affect: Mood normal. Mood is not anxious or depressed. Affect is not labile, blunt, angry or inappropriate.        Speech: Speech normal.        Behavior: Behavior  normal.        Thought Content: Thought content normal. Thought content is not paranoid or delusional. Thought content does not include homicidal or suicidal ideation. Thought content does not include homicidal or suicidal plan.        Cognition and Memory: Cognition and memory normal.        Judgment: Judgment normal.     Comments: Insight intact     Lab Review:     Component Value Date/Time   NA 140 03/16/2023 1028   K 4.7 03/16/2023 1028   CL 101 03/16/2023 1028   CO2 23 03/16/2023 1028   GLUCOSE 99 03/16/2023 1028   GLUCOSE 95 07/24/2022 1527   BUN 10 03/16/2023 1028   CREATININE 1.00 03/16/2023 1028   CALCIUM 9.2 03/16/2023 1028   PROT 7.1 03/16/2023 1028   ALBUMIN 4.8 03/16/2023 1028   AST 27 03/16/2023 1028   ALT 33 03/16/2023 1028   ALKPHOS 56 03/16/2023 1028   BILITOT <0.2 03/16/2023 1028   GFRNONAA >60 07/24/2022 1527   GFRAA 137 03/14/2017 1350       Component Value Date/Time   WBC 6.1 07/24/2022 1527   RBC 4.68 07/24/2022 1527   HGB 14.0 07/24/2022 1527   HGB 13.8 07/06/2017 1615   HCT 40.9 07/24/2022 1527   HCT 40.3 07/06/2017 1615   PLT 246 07/24/2022 1527   PLT 224 07/06/2017 1615   MCV 87.4 07/24/2022 1527   MCV 88 07/06/2017 1615   MCH 29.9 07/24/2022 1527   MCHC 34.2 07/24/2022 1527   RDW 12.6 07/24/2022 1527   RDW 13.5 07/06/2017 1615   LYMPHSABS 2.1 07/24/2022 1527   LYMPHSABS 1.8 07/06/2017 1615   MONOABS 0.4 07/24/2022 1527   EOSABS 0.1 07/24/2022 1527   EOSABS 0.2 07/06/2017 1615   BASOSABS 0.0 07/24/2022 1527   BASOSABS 0.0 07/06/2017 1615    No results found for: POCLITH, LITHIUM   Lab Results  Component Value Date   VALPROATE 50 03/16/2023     .res Assessment: Plan:    Plan:  PDMP reviewed  Would like to consider restarting ADD medications - will review previous documentation. Previous history of ADD. Remembers doing well with Concerta .  Intuniv  2mg  at hs. Remeron  15mg  at hs Seroquel  400mg  at bedtime. Depakote   750mg  - in the morning and 500mg  at hs per patient.  Discussed potential benefits, risks, and side effects of Depakote  and need for periodic lab monitoring to assess for potential adverse effects to include obtaining CBC and LFTs. Will request labs at next visit.  RTC 2 months  Patient advised to contact office with any questions, adverse effects, or acute worsening in signs and symptoms.  Discussed potential metabolic side effects associated with atypical antipsychotics, as well as potential risk for movement side effects. Advised pt to contact office if movement side effects  occur.   Diagnoses and all orders for this visit:  Bipolar 1 disorder, mixed, moderate (HCC) -     divalproex  (DEPAKOTE  ER) 250 MG 24 hr tablet; Take 3 tablets (750 mg) in the morning and take 2 tablets (500 mg) at night. -     guanFACINE  (INTUNIV ) 2 MG TB24 ER tablet; Take 1 tablet (2 mg total) by mouth daily. -     QUEtiapine  (SEROQUEL ) 400 MG tablet; Take one tablet at bedtime.  Generalized anxiety disorder  PTSD (post-traumatic stress disorder) -     mirtazapine  (REMERON ) 15 MG tablet; Take 1 tablet (15 mg total) by mouth at bedtime.  Insomnia, unspecified type -     mirtazapine  (REMERON ) 15 MG tablet; Take 1 tablet (15 mg total) by mouth at bedtime.  High risk medication use     Please see After Visit Summary for patient specific instructions.  No future appointments.   No orders of the defined types were placed in this encounter.   -------------------------------

## 2023-09-30 ENCOUNTER — Telehealth: Payer: 59 | Admitting: Adult Health

## 2023-09-30 ENCOUNTER — Encounter: Payer: Self-pay | Admitting: Adult Health

## 2023-09-30 DIAGNOSIS — F3162 Bipolar disorder, current episode mixed, moderate: Secondary | ICD-10-CM

## 2023-09-30 DIAGNOSIS — F909 Attention-deficit hyperactivity disorder, unspecified type: Secondary | ICD-10-CM

## 2023-09-30 DIAGNOSIS — F319 Bipolar disorder, unspecified: Secondary | ICD-10-CM | POA: Diagnosis not present

## 2023-09-30 DIAGNOSIS — F411 Generalized anxiety disorder: Secondary | ICD-10-CM

## 2023-09-30 DIAGNOSIS — G4700 Insomnia, unspecified: Secondary | ICD-10-CM | POA: Diagnosis not present

## 2023-09-30 DIAGNOSIS — F4310 Post-traumatic stress disorder, unspecified: Secondary | ICD-10-CM

## 2023-09-30 MED ORDER — DIVALPROEX SODIUM ER 250 MG PO TB24: ORAL_TABLET | ORAL | 2 refills | Status: DC

## 2023-09-30 MED ORDER — QUETIAPINE FUMARATE 400 MG PO TABS: ORAL_TABLET | ORAL | 1 refills | Status: DC

## 2023-09-30 MED ORDER — MIRTAZAPINE 15 MG PO TABS: 15.0000 mg | ORAL_TABLET | Freq: Every day | ORAL | 1 refills | Status: DC

## 2023-09-30 MED ORDER — DIVALPROEX SODIUM ER 500 MG PO TB24: ORAL_TABLET | ORAL | 2 refills | Status: DC

## 2023-09-30 MED ORDER — GUANFACINE HCL ER 2 MG PO TB24: 2.0000 mg | ORAL_TABLET | Freq: Every day | ORAL | 1 refills | Status: DC
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
## 2023-11-26 ENCOUNTER — Ambulatory Visit: Payer: Self-pay | Admitting: Nurse Practitioner

## 2023-11-26 VITALS — BP 126/80 | HR 61 | Temp 98.4°F | Ht 66.0 in | Wt 210.0 lb

## 2023-11-26 DIAGNOSIS — Z0001 Encounter for general adult medical examination with abnormal findings: Secondary | ICD-10-CM

## 2023-11-26 DIAGNOSIS — Z23 Encounter for immunization: Secondary | ICD-10-CM

## 2023-11-26 DIAGNOSIS — Z3141 Encounter for fertility testing: Secondary | ICD-10-CM | POA: Diagnosis not present

## 2023-11-26 DIAGNOSIS — Z136 Encounter for screening for cardiovascular disorders: Secondary | ICD-10-CM

## 2023-11-26 DIAGNOSIS — Z1159 Encounter for screening for other viral diseases: Secondary | ICD-10-CM

## 2023-11-26 DIAGNOSIS — Z13220 Encounter for screening for lipoid disorders: Secondary | ICD-10-CM | POA: Insufficient documentation

## 2023-11-26 DIAGNOSIS — Z0282 Encounter for adoption services: Secondary | ICD-10-CM | POA: Insufficient documentation

## 2023-11-26 DIAGNOSIS — Z113 Encounter for screening for infections with a predominantly sexual mode of transmission: Secondary | ICD-10-CM

## 2023-11-26 DIAGNOSIS — Z131 Encounter for screening for diabetes mellitus: Secondary | ICD-10-CM | POA: Insufficient documentation

## 2023-11-26 DIAGNOSIS — E66811 Obesity, class 1: Secondary | ICD-10-CM | POA: Diagnosis not present

## 2023-11-26 DIAGNOSIS — R350 Frequency of micturition: Secondary | ICD-10-CM | POA: Insufficient documentation

## 2023-11-26 DIAGNOSIS — Z6833 Body mass index (BMI) 33.0-33.9, adult: Secondary | ICD-10-CM

## 2023-11-26 DIAGNOSIS — R351 Nocturia: Secondary | ICD-10-CM | POA: Insufficient documentation

## 2023-11-26 NOTE — Assessment & Plan Note (Signed)
 Labs ordered, further recommendations may be made based upon his results.

## 2023-11-26 NOTE — Assessment & Plan Note (Signed)
 Referral to urology ordered today

## 2023-11-26 NOTE — Assessment & Plan Note (Signed)
 Discussed healthy lifestyle as well as upcoming screening recommendations. Handout provided.

## 2023-11-26 NOTE — Assessment & Plan Note (Signed)
Tdap administered, VIS provided.

## 2023-11-26 NOTE — Assessment & Plan Note (Signed)
 Etiology unclear Labs ordered today and referral to urology ordered.  Further recommendations may be made based upon these results.
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
## 2023-11-26 NOTE — Assessment & Plan Note (Signed)
 Etiology unclear Labs ordered and referral to urology made today. Further recommendations needed made based upon lab results.

## 2023-11-26 NOTE — Assessment & Plan Note (Signed)
 Labs ordered, further recommendations may be made based upon these results

## 2023-12-03 ENCOUNTER — Other Ambulatory Visit (INDEPENDENT_AMBULATORY_CARE_PROVIDER_SITE_OTHER)

## 2023-12-03 DIAGNOSIS — Z6833 Body mass index (BMI) 33.0-33.9, adult: Secondary | ICD-10-CM

## 2023-12-03 DIAGNOSIS — Z1159 Encounter for screening for other viral diseases: Secondary | ICD-10-CM

## 2023-12-03 DIAGNOSIS — Z136 Encounter for screening for cardiovascular disorders: Secondary | ICD-10-CM | POA: Diagnosis not present

## 2023-12-03 DIAGNOSIS — Z113 Encounter for screening for infections with a predominantly sexual mode of transmission: Secondary | ICD-10-CM

## 2023-12-03 DIAGNOSIS — E66811 Obesity, class 1: Secondary | ICD-10-CM

## 2023-12-03 DIAGNOSIS — R350 Frequency of micturition: Secondary | ICD-10-CM

## 2023-12-03 DIAGNOSIS — Z131 Encounter for screening for diabetes mellitus: Secondary | ICD-10-CM | POA: Diagnosis not present

## 2023-12-03 DIAGNOSIS — Z13220 Encounter for screening for lipoid disorders: Secondary | ICD-10-CM

## 2023-12-03 DIAGNOSIS — Z3141 Encounter for fertility testing: Secondary | ICD-10-CM

## 2023-12-03 DIAGNOSIS — R351 Nocturia: Secondary | ICD-10-CM | POA: Diagnosis not present

## 2023-12-03 LAB — URINALYSIS, ROUTINE W REFLEX MICROSCOPIC
Bilirubin Urine: NEGATIVE
Hgb urine dipstick: NEGATIVE
Ketones, ur: NEGATIVE
Leukocytes,Ua: NEGATIVE
Nitrite: NEGATIVE
RBC / HPF: NONE SEEN (ref 0–?)
Specific Gravity, Urine: <=1.005 — AB (ref 1.000–1.030)
Total Protein, Urine: NEGATIVE
Urine Glucose: NEGATIVE
Urobilinogen, UA: 0.2 (ref 0.0–1.0)
WBC, UA: NONE SEEN (ref 0–?)
pH: 6.5 (ref 5.0–8.0)

## 2023-12-03 LAB — COMPREHENSIVE METABOLIC PANEL WITH GFR
ALT: 20 U/L (ref 0–53)
AST: 26 U/L (ref 0–37)
Albumin: 4.7 g/dL (ref 3.5–5.2)
Alkaline Phosphatase: 43 U/L (ref 39–117)
BUN: 17 mg/dL (ref 6–23)
CO2: 29 meq/L (ref 19–32)
Calcium: 9.3 mg/dL (ref 8.4–10.5)
Chloride: 101 meq/L (ref 96–112)
Creatinine, Ser: 1.06 mg/dL (ref 0.40–1.50)
GFR: 95.47 mL/min (ref >60.00)
Glucose, Bld: 79 mg/dL (ref 70–99)
Potassium: 4.8 meq/L (ref 3.5–5.1)
Sodium: 137 meq/L (ref 135–145)
Total Bilirubin: 0.3 mg/dL (ref 0.2–1.2)
Total Protein: 6.8 g/dL (ref 6.0–8.3)

## 2023-12-03 LAB — CBC
HCT: 42.1 % (ref 39.0–52.0)
Hemoglobin: 14.1 g/dL (ref 13.0–17.0)
MCHC: 33.5 g/dL (ref 30.0–36.0)
MCV: 90.4 fl (ref 78.0–100.0)
Platelets: 249 10*3/uL (ref 150.0–400.0)
RBC: 4.65 Mil/uL (ref 4.22–5.81)
RDW: 15.9 % — ABNORMAL HIGH (ref 11.5–15.5)
WBC: 4.5 10*3/uL (ref 4.0–10.5)

## 2023-12-03 LAB — LIPID PANEL
Cholesterol: 157 mg/dL (ref 0–200)
HDL: 35.1 mg/dL — ABNORMAL LOW (ref >39.00)
LDL Cholesterol: 111 mg/dL — ABNORMAL HIGH (ref 0–99)
NonHDL: 121.55
Total CHOL/HDL Ratio: 4
Triglycerides: 54 mg/dL (ref 0.0–149.0)
VLDL: 10.8 mg/dL (ref 0.0–40.0)

## 2023-12-03 LAB — HEMOGLOBIN A1C: Hgb A1c MFr Bld: 5.4 % (ref 4.6–6.5)

## 2023-12-04 ENCOUNTER — Encounter: Payer: Self-pay | Admitting: Nurse Practitioner

## 2023-12-04 LAB — RPR: RPR Ser Ql: NONREACTIVE

## 2023-12-04 LAB — URINE CULTURE: Result:: NO GROWTH

## 2023-12-04 LAB — HEPATITIS C ANTIBODY: Hepatitis C Ab: NONREACTIVE

## 2023-12-04 LAB — TSH: TSH: 1.33 u[IU]/mL (ref 0.35–5.50)

## 2023-12-04 LAB — TESTOSTERONE TOTAL,FREE,BIO, MALES
Albumin: 4.7 g/dL (ref 3.6–5.1)
Sex Hormone Binding: 61 nmol/L — ABNORMAL HIGH (ref 10–50)
Testosterone, Bioavailable: 204.9 ng/dL (ref 110.0–575.0)
Testosterone, Free: 95.6 pg/mL (ref 46.0–224.0)
Testosterone: 1081 ng/dL — ABNORMAL HIGH (ref 250–827)

## 2023-12-04 LAB — PSA: PSA: 0.51 ng/mL (ref 0.10–4.00)

## 2023-12-04 LAB — HIV ANTIBODY (ROUTINE TESTING W REFLEX): HIV 1&2 Ab, 4th Generation: NONREACTIVE

## 2023-12-05 LAB — CHLAMYDIA/NEISSERIA GONORRHOEAE RNA,TMA,UROGENTIAL
C. trachomatis RNA, TMA: NOT DETECTED
N. gonorrhoeae RNA, TMA: NOT DETECTED

## 2023-12-07 ENCOUNTER — Encounter: Payer: Self-pay | Admitting: Nurse Practitioner

## 2023-12-09 ENCOUNTER — Encounter: Payer: Self-pay | Admitting: Adult Health

## 2023-12-09 ENCOUNTER — Telehealth (INDEPENDENT_AMBULATORY_CARE_PROVIDER_SITE_OTHER): Payer: 59 | Admitting: Adult Health

## 2023-12-09 DIAGNOSIS — F909 Attention-deficit hyperactivity disorder, unspecified type: Secondary | ICD-10-CM | POA: Diagnosis not present

## 2023-12-09 DIAGNOSIS — F4310 Post-traumatic stress disorder, unspecified: Secondary | ICD-10-CM

## 2023-12-09 DIAGNOSIS — G4700 Insomnia, unspecified: Secondary | ICD-10-CM | POA: Diagnosis not present

## 2023-12-09 DIAGNOSIS — F411 Generalized anxiety disorder: Secondary | ICD-10-CM

## 2023-12-09 DIAGNOSIS — F319 Bipolar disorder, unspecified: Secondary | ICD-10-CM

## 2023-12-09 DIAGNOSIS — F3162 Bipolar disorder, current episode mixed, moderate: Secondary | ICD-10-CM

## 2023-12-09 MED ORDER — MIRTAZAPINE 15 MG PO TABS: 15.0000 mg | ORAL_TABLET | Freq: Every day | ORAL | 2 refills | Status: DC

## 2023-12-09 MED ORDER — GUANFACINE HCL ER 2 MG PO TB24: 2.0000 mg | ORAL_TABLET | Freq: Every day | ORAL | 2 refills | Status: DC

## 2023-12-09 MED ORDER — DIVALPROEX SODIUM ER 500 MG PO TB24: ORAL_TABLET | ORAL | 2 refills | Status: DC

## 2023-12-09 MED ORDER — DIVALPROEX SODIUM ER 250 MG PO TB24: ORAL_TABLET | ORAL | 2 refills | Status: DC

## 2023-12-09 MED ORDER — QUETIAPINE FUMARATE 400 MG PO TABS: ORAL_TABLET | ORAL | 2 refills | Status: DC

## 2023-12-09 NOTE — Progress Notes (Signed)
 Jordan Christian [id number] May 15, 1996 28 y.o.  Virtual Visit via Video Note  I connected with pt @ on 12/09/23 at  8:30 AM EDT by a video enabled telemedicine application and verified that I am speaking with the correct person using two identifiers.   I discussed the limitations of evaluation and management by telemedicine and the availability of in person appointments. The patient expressed understanding and agreed to proceed.  I discussed the assessment and treatment plan with the patient. The patient was provided an opportunity to ask questions and all were answered. The patient agreed with the plan and demonstrated an understanding of the instructions.   The patient was advised to call back or seek an in-person evaluation if the symptoms worsen or if the condition fails to improve as anticipated.  I provided 25 minutes of non-face-to-face time during this encounter.  The patient was located at home.  The provider was located at [facility].   Dorothyann Gibbs, NP   Subjective:   Patient ID:  AVEION Christian is a 28 y.o. (DOB Apr 03, 1996) male.  Chief Complaint: No chief complaint on file.   HPI Ryoma Nofziger Richwine presents for follow-up of GAD, ADHD, BPD-1 and insomnia.  Describes mood today as "ok". Pleasant. Reports tearfulness. Mood symptoms - reports depression - "feeling sad a lot". Reports situational anxiety. Reports irritability at times - frustration. Reports improved interest and motivation. Denies recent panic attacks. Denies recent outbursts - "I have been snappy". Reports feeling more positive since returning to work. Mood is variable - "a lot of ups and downs". Stating "I feel like I'm doing good for the most part".Taking medications as prescribed. Energy levels stable. Active, has a regular exercise routine. Enjoys some usual interests and activities. Lives with wife - pets. Spending time with family. Appetite adequate. Weight loss - 200 pounds. Sleeps better some  nights than others. Averages 6  hours of broken sleep. Reports focus and concentration difficulties. Completing tasks. Managing aspects of household.  Has started working again - found a job.  Denies SI or HI.  Denies AH or VH. Denies self harm. Reports THC use Reports alcohol use - 2 drinks a week  Working with a therapist - Elio Forget.  Previous medication trials: Celexa, Remeron, Concerta, Intuniv, Stratera, Wellbutrin    Review of Systems:  Review of Systems  Musculoskeletal:  Negative for gait problem.  Neurological:  Negative for tremors.  Psychiatric/Behavioral:         Please refer to HPI    Medications: I have reviewed the patient's current medications.  Current Outpatient Medications  Medication Sig Dispense Refill   cetirizine (ZYRTEC ALLERGY) 10 MG tablet Take 1 tablet (10 mg total) by mouth daily. 30 tablet 0   divalproex (DEPAKOTE ER) 250 MG 24 hr tablet Take one tablet twice daily. 60 tablet 2   divalproex (DEPAKOTE ER) 500 MG 24 hr tablet Take one tablet twice daily. 60 tablet 2   fluticasone (FLONASE) 50 MCG/ACT nasal spray Place 1 spray into both nostrils daily for 14 days. 16 g 0   guanFACINE (INTUNIV) 2 MG TB24 ER tablet Take 1 tablet (2 mg total) by mouth daily. 90 tablet 1   Multiple Vitamin (MULTIVITAMIN WITH MINERALS) TABS tablet Take 1 tablet by mouth daily.     QUEtiapine (SEROQUEL) 400 MG tablet Take one tablet at bedtime. 90 tablet 1   No current facility-administered medications for this visit.    Medication Side Effects: None  Allergies: No Known  Allergies  Past Medical History:  Diagnosis Date   ADHD    Depression    GERD (gastroesophageal reflux disease)     Family History  Adopted: Yes  Problem Relation Age of Onset   Bipolar disorder Mother    Bipolar disorder Sister    Alcohol abuse Father     Social History   Socioeconomic History   Marital status: Single    Spouse name: Not on file   Number of children: Not on file    Years of education: Not on file   Highest education level: Not on file  Occupational History   Occupation: Personnel officer  Tobacco Use   Smoking status: Former    Types: Cigarettes, E-cigarettes   Smokeless tobacco: Never  Vaping Use   Vaping status: Every Day  Substance and Sexual Activity   Alcohol use: No    Alcohol/week: 1.0 - 2.0 standard drink of alcohol    Types: 1 - 2 Cans of beer per week    Comment: occ   Drug use: Yes    Types: Marijuana    Comment: occ   Sexual activity: Yes    Partners: Female    Birth control/protection: None  Other Topics Concern   Not on file  Social History Narrative   Not on file   Social Drivers of Health   Financial Resource Strain: Not on file  Food Insecurity: Not on file  Transportation Needs: Not on file  Physical Activity: Not on file  Stress: Not on file  Social Connections: Not on file  Intimate Partner Violence: Not on file    Past Medical History, Surgical history, Social history, and Family history were reviewed and updated as appropriate.   Please see review of systems for further details on the patient's review from today.   Objective:   Physical Exam:  There were no vitals taken for this visit.  Physical Exam Constitutional:      General: He is not in acute distress. Musculoskeletal:        General: No deformity.  Neurological:     Mental Status: He is alert and oriented to person, place, and time.     Coordination: Coordination normal.  Psychiatric:        Attention and Perception: Attention and perception normal. He does not perceive auditory or visual hallucinations.        Mood and Affect: Affect is not labile, blunt, angry or inappropriate.        Speech: Speech normal.        Behavior: Behavior normal.        Thought Content: Thought content normal. Thought content is not paranoid or delusional. Thought content does not include homicidal or suicidal ideation. Thought content does not include homicidal  or suicidal plan.        Cognition and Memory: Cognition and memory normal.        Judgment: Judgment normal.     Comments: Insight intact     Lab Review:     Component Value Date/Time   NA 137 12/03/2023 0802   NA 140 03/16/2023 1028   K 4.8 12/03/2023 0802   CL 101 12/03/2023 0802   CO2 29 12/03/2023 0802   GLUCOSE 79 12/03/2023 0802   BUN 17 12/03/2023 0802   BUN 10 03/16/2023 1028   CREATININE 1.06 12/03/2023 0802   CALCIUM 9.3 12/03/2023 0802   PROT 6.8 12/03/2023 0802   PROT 7.1 03/16/2023 1028   ALBUMIN 4.7 12/03/2023 0802  ALBUMIN 4.8 03/16/2023 1028   AST 26 12/03/2023 0802   ALT 20 12/03/2023 0802   ALKPHOS 43 12/03/2023 0802   BILITOT 0.3 12/03/2023 0802   BILITOT <0.2 03/16/2023 1028   GFRNONAA >60 07/24/2022 1527   GFRAA 137 03/14/2017 1350       Component Value Date/Time   WBC 4.5 12/03/2023 0802   RBC 4.65 12/03/2023 0802   HGB 14.1 12/03/2023 0802   HGB 13.8 07/06/2017 1615   HCT 42.1 12/03/2023 0802   HCT 40.3 07/06/2017 1615   PLT 249.0 12/03/2023 0802   PLT 224 07/06/2017 1615   MCV 90.4 12/03/2023 0802   MCV 88 07/06/2017 1615   MCH 29.9 07/24/2022 1527   MCHC 33.5 12/03/2023 0802   RDW 15.9 (H) 12/03/2023 0802   RDW 13.5 07/06/2017 1615   LYMPHSABS 2.1 07/24/2022 1527   LYMPHSABS 1.8 07/06/2017 1615   MONOABS 0.4 07/24/2022 1527   EOSABS 0.1 07/24/2022 1527   EOSABS 0.2 07/06/2017 1615   BASOSABS 0.0 07/24/2022 1527   BASOSABS 0.0 07/06/2017 1615    No results found for: "POCLITH", "LITHIUM"   Lab Results  Component Value Date   VALPROATE 50 03/16/2023     .res Assessment: Plan:    Plan:  PDMP reviewed  Plan: Intuniv 2mg  at hs. Remeron 15mg  at hs Seroquel 400mg  at bedtime. Depakote 750mg  BID  RTC 2 months  25 minutes spent dedicated to the care of this patient on the date of this encounter to include pre-visit review of records, ordering of medication, post visit documentation, and face-to-face time with the patient  discussing GAD, ADHD, BPD-1 and insomnia. Will increase Depakote to help with mood instability.  Discussed potential benefits, risks, and side effects of Depakote and need for periodic lab monitoring to assess for potential adverse effects to include obtaining CBC and LFTs. Will request labs at next visit.  Patient advised to contact office with any questions, adverse effects, or acute worsening in signs and symptoms.  Discussed potential metabolic side effects associated with atypical antipsychotics, as well as potential risk for movement side effects. Advised pt to contact office if movement side effects occur.   There are no diagnoses linked to this encounter.   Please see After Visit Summary for patient specific instructions.  Future Appointments  Date Time Provider Department Center  01/05/2024  9:30 AM Marcine Matar, MD [facility] None  01/07/2024  8:00 AM Elenore Paddy, NP [facility] None    No orders of the defined types were placed in this encounter.     -------------------------------

## 2023-12-10 ENCOUNTER — Ambulatory Visit: Payer: Self-pay | Admitting: Family Medicine

## 2023-12-11 ENCOUNTER — Telehealth: Payer: Self-pay

## 2023-12-11 NOTE — Telephone Encounter (Signed)
 Prior Authorization submitted and approved for Guanfacine ER 2 mg tablets with [facility] effective 12/11/23-12/10/24

## 2024-01-05 ENCOUNTER — Ambulatory Visit: Admitting: Urology

## 2024-01-07 ENCOUNTER — Ambulatory Visit: Admitting: Nurse Practitioner

## 2024-01-07 VITALS — BP 120/78 | HR 86 | Temp 98.6°F | Ht 66.0 in | Wt 201.0 lb

## 2024-01-07 DIAGNOSIS — L709 Acne, unspecified: Secondary | ICD-10-CM | POA: Diagnosis not present

## 2024-01-07 DIAGNOSIS — E785 Hyperlipidemia, unspecified: Secondary | ICD-10-CM

## 2024-01-07 NOTE — Assessment & Plan Note (Signed)
 Chronic, intermittent Located to upper legs.  Does not appear fungal on inspection today.  Recommend treatment with over-the-counter benzyl peroxide wash daily x 2 to 3 weeks.  If symptoms persist or do not improve consider topical antibiotic cream/ointment.

## 2024-01-07 NOTE — Assessment & Plan Note (Signed)
 Chronic Discussed lifestyle modification.  Encourage patient to continue exercising on a regular basis. Discussed dietary modification to help with reduction in dietary cholesterol.

## 2024-01-07 NOTE — Progress Notes (Signed)
   Established Patient Office Visit  Subjective   Patient ID: Jordan Christian, male    DOB: 1996-01-20  Age: 28 y.o. MRN: [id number]  Chief Complaint  Patient presents with   Hyperlipidemia    HLD: Chronic, LDL 111.  Noted on last fasting lipid panel annual physical.  Acne: Has been noticing body acne.  Seems to have been triggered by increased physical exercise as he has been training for a half marathon.  Located mostly on legs little bit on back.  Does come and go, is pruritic.  He has treated with showering after exercise.  Reports changing his sheets about every 3 weeks.    ROS: see HPI    Objective:     BP 120/78   Pulse 86   Temp 98.6 F (37 C) (Temporal)   Ht 5\' 6"  (1.676 m)   Wt 201 lb (91.2 kg)   SpO2 96%   BMI 32.44 kg/m    Physical Exam Vitals reviewed.  Constitutional:      Appearance: Normal appearance.  HENT:     Head: Normocephalic and atraumatic.  Cardiovascular:     Rate and Rhythm: Normal rate and regular rhythm.  Pulmonary:     Effort: Pulmonary effort is normal.     Breath sounds: Normal breath sounds.  Musculoskeletal:     Cervical back: Neck supple.  Skin:    General: Skin is warm and dry.     Comments: Areas of acne on upper thighs  Neurological:     Mental Status: He is alert and oriented to person, place, and time.  Psychiatric:        Mood and Affect: Mood normal.        Behavior: Behavior normal.        Thought Content: Thought content normal.        Judgment: Judgment normal.      No results found for any visits on 01/07/24.    The ASCVD Risk score (Arnett DK, et al., 2019) failed to calculate for the following reasons:   The 2019 ASCVD risk score is only valid for ages 73 to 63    Assessment & Plan:   Problem List Items Addressed This Visit       Musculoskeletal and Integument   Acne   Chronic, intermittent Located to upper legs.  Does not appear fungal on inspection today.  Recommend treatment with  over-the-counter benzyl peroxide wash daily x 2 to 3 weeks.  If symptoms persist or do not improve consider topical antibiotic cream/ointment.        Other   HLD (hyperlipidemia) - Primary   Chronic Discussed lifestyle modification.  Encourage patient to continue exercising on a regular basis. Discussed dietary modification to help with reduction in dietary cholesterol.       No follow-ups on file.    Zorita Hiss, NP

## 2024-01-29 ENCOUNTER — Ambulatory Visit: Admitting: Urology

## 2024-01-29 ENCOUNTER — Encounter: Payer: Self-pay | Admitting: Urology

## 2024-01-29 VITALS — BP 109/68 | HR 55

## 2024-01-29 DIAGNOSIS — N469 Male infertility, unspecified: Secondary | ICD-10-CM

## 2024-01-29 NOTE — Patient Instructions (Signed)
Male Infertility  Male infertility refers to a male's inability to get a male pregnant (get her to conceive) after a year of having sex regularly without using birth control. Both males and females can have fertility problems. What are the causes? This condition may be caused by: Problems with sperm. Infertility can result if a male is: Not producing enough sperm (low sperm count). Not producing enough sperm of normal size and shape (poor sperm morphology). Producing sperm that are not able to reach the egg (poor motility). Problems in a man's reproductive organs, such as: Enlarged veins (varicoceles), cysts (spermatoceles), or tumors of the testicles. Sexual dysfunction, including not being able to have an erection. Injury to the testicles. Having had a testicle that did not drop to its location in the scrotum (undescended testicle). A birth defect, such as not having the tubes that carry sperm (vas deferens). A lack of certain hormones. Certain medical conditions. These may include: Diabetes. Cancer and cancer treatments, such as chemotherapy or radiation. Klinefelter syndrome. This is an inherited genetic disorder. Thyroid problems, such as an underactive or overactive thyroid. Cystic fibrosis. Infections. Sexually transmitted diseases. Infertility can be linked to more than one cause. The cause of infertility in some men is not known. This is called unexplained infertility. What increases the risk? Age. A male's fertility declines with age. Using products that contain nicotine or tobacco. Excessive alcohol use. Obesity. Emotional stress. Exposure of the testicles to heat, such as frequent use of a hot tub or sauna. Using drugs such as anabolic steroids, cocaine, and marijuana. Being exposed to environmental toxins, such as pesticides and lead. What are the signs or symptoms? The main sign of infertility in males is the inability to get a male to conceive. How is this  diagnosed? This condition may be diagnosed using: Semen analysis tests to check sperm count, morphology, and motility. Blood tests to check hormone levels. Ultrasound of the scrotum to check for a varicocele or problems with the testicles. Transrectal ultrasound to check the prostate gland and to look for problems with the tubes that transport the semen (seminal vesicles). Taking a small sample of tissue from inside a testicle to look at it under a microscope (biopsy). Blood tests to check for genetic abnormalities (genetic testing). To be diagnosed with infertility, both partners will have a physical exam and tell their health care providers about their medical and sexual histories. Additional tests may be done. How is this treated? Treatment depends on the cause of infertility. Most cases of infertility in males are treated with medicine, surgery, or lifestyle changes. Treatment may include: Taking medicines. Medicines may: Correct hormone problems. Treat other health conditions. Treat infections. Treat sexual dysfunction. Having surgery. You may have surgery to: Remove blockages in the reproductive tract. Correct other structural problems of the reproductive tract. Making lifestyle changes. These changes may include: Reducing alcohol use or not drinking alcohol. Stopping use of drugs such as anabolic steroids, cocaine, and marijuana. Losing weight. Stopping smoking. Using stress reduction techniques. If other treatments do not work, your health care provider may recommend assisted reproductive technology (ART). ART refers to all treatments and procedures that combine eggs and sperm outside the body to try to help a couple conceive. ART may be effective for infertility caused by sperm problems including low sperm count and low motility. Examples of ART include intrauterine insemination and in vitro fertilization. Follow these instructions at home: Lifestyle If you drink alcohol, limit  how much you have to 0-2 drinks  a day. Do not use any products that contain nicotine or tobacco. These products include cigarettes, chewing tobacco, and vaping devices, such as e-cigarettes. If you need help quitting, ask your health care provider. Make changes to your diet if needed to lose weight or maintain a healthy weight. Work with your health care provider and a dietitian to set a weight-loss goal that is healthy and reasonable for you. Practice stress reduction techniques that work well for you, such as regular physical activity, meditation, or deep breathing. General instructions Take over-the-counter and prescription medicines only as told by your health care provider. Seek support from a counselor or support group to talk about your concerns related to infertility. Couples counseling may be helpful for you and your partner. Keep all follow-up visits. This is important. Contact a health care provider if you: Feel that stress is interfering with your life and relationships. Have side effects from treatments for infertility. Summary Male infertility refers to a male's inability to get a male pregnant (get her to conceive) after a year of having sex regularly without using birth control. To be diagnosed with infertility, both partners will have a physical exam and tell their health care providers about their medical and sexual histories. Seek support from a counselor or support group to talk about your concerns related to infertility. Couples counseling may be helpful for you and your partner. This information is not intended to replace advice given to you by your health care provider. Make sure you discuss any questions you have with your health care provider. Document Revised: 04/24/2021 Document Reviewed: 04/24/2021 Elsevier Patient Education  2024 [employer].

## 2024-01-29 NOTE — Progress Notes (Signed)
 01/29/2024 9:23 AM   RYKIN ROUTE 06-20-1996 [id number]  Referring provider: Zorita Hiss, NP [address] [place],  [place] [address]  infertility   HPI: Mr Tritz is a 27yo here for evaluation for infertility. He and his wife have trying to conceive for 4 years without success. His partner has not been evaluated for infertility. No family hx of fertility. He was started on clomid 3 months ago for testosterone  in the 200s. Last testosterone  was 1080 on clomid 25mg  daily. His estradiol increased to 70-80 and he is awaiting to start anastrazole.He has had no semen analysis. Good libido.     PMH: Past Medical History:  Diagnosis Date   ADHD    Depression    GERD (gastroesophageal reflux disease)     Surgical History: Past Surgical History:  Procedure Laterality Date   WISDOM TOOTH EXTRACTION      Home Medications:  Allergies as of 01/29/2024   No Known Allergies      Medication List        Accurate as of Jan 29, 2024  9:23 AM. If you have any questions, ask your nurse or doctor.          cetirizine  10 MG tablet Commonly known as: ZyrTEC  Allergy Take 1 tablet (10 mg total) by mouth daily.   divalproex  250 MG 24 hr tablet Commonly known as: Depakote  ER Take one tablet twice daily.   divalproex  500 MG 24 hr tablet Commonly known as: Depakote  ER Take one tablet twice daily.   fluticasone  50 MCG/ACT nasal spray Commonly known as: FLONASE  Place 1 spray into both nostrils daily for 14 days.   guanFACINE  2 MG Tb24 ER tablet Commonly known as: INTUNIV  Take 1 tablet (2 mg total) by mouth daily.   mirtazapine  15 MG tablet Commonly known as: REMERON  Take 1 tablet (15 mg total) by mouth at bedtime.   multivitamin with minerals Tabs tablet Take 1 tablet by mouth daily.   QUEtiapine  400 MG tablet Commonly known as: SEROquel  Take one tablet at bedtime.        Allergies: No Known Allergies  Family History: Family History  Adopted: Yes  Problem  Relation Age of Onset   Bipolar disorder Mother    Bipolar disorder Sister    Alcohol abuse Father     Social History:  reports that he has quit smoking. His smoking use included cigarettes and e-cigarettes. He has never used smokeless tobacco. He reports current drug use. Drug: Marijuana. He reports that he does not drink alcohol.  ROS: All other review of systems were reviewed and are negative except what is noted above in HPI  Physical Exam: BP 109/68   Pulse (!) 55   Constitutional:  Alert and oriented, No acute distress. HEENT: [place] AT, moist mucus membranes.  Trachea midline, no masses. Cardiovascular: No clubbing, cyanosis, or edema. Respiratory: Normal respiratory effort, no increased work of breathing. GI: Abdomen is soft, nontender, nondistended, no abdominal masses GU: No CVA tenderness. Circumcised phallus. No masses/lesions on penis, testis, scrotum. Prostate 40g smooth no nodules no induration.  Lymph: No cervical or inguinal lymphadenopathy. Skin: No rashes, bruises or suspicious lesions. Neurologic: Grossly intact, no focal deficits, moving all 4 extremities. Psychiatric: Normal mood and affect.  Laboratory Data: Lab Results  Component Value Date   WBC 4.5 12/03/2023   HGB 14.1 12/03/2023   HCT 42.1 12/03/2023   MCV 90.4 12/03/2023   PLT 249.0 12/03/2023    Lab Results  Component  Value Date   CREATININE 1.06 12/03/2023    Lab Results  Component Value Date   PSA 0.51 12/03/2023    Lab Results  Component Value Date   TESTOSTERONE  1,081 (H) 12/03/2023    Lab Results  Component Value Date   HGBA1C 5.4 12/03/2023    Urinalysis    Component Value Date/Time   COLORURINE YELLOW 12/03/2023 0802   APPEARANCEUR CLEAR 12/03/2023 0802   LABSPEC <=1.005 (A) 12/03/2023 0802   PHURINE 6.5 12/03/2023 0802   GLUCOSEU NEGATIVE 12/03/2023 0802   HGBUR NEGATIVE 12/03/2023 0802   BILIRUBINUR NEGATIVE 12/03/2023 0802   KETONESUR NEGATIVE 12/03/2023 0802    PROTEINUR NEGATIVE 11/29/2010 0938   UROBILINOGEN 0.2 12/03/2023 0802   NITRITE NEGATIVE 12/03/2023 0802   LEUKOCYTESUR NEGATIVE 12/03/2023 0802    No results found for: "LABMICR", "WBCUA", "RBCUA", "LABEPIT", "MUCUS", "BACTERIA"  Pertinent Imaging:  No results found for this or any previous visit.  No results found for this or any previous visit.  No results found for this or any previous visit.  No results found for this or any previous visit.  No results found for this or any previous visit.  No results found for this or any previous visit.  No results found for this or any previous visit.  No results found for this or any previous visit.   Assessment & Plan:    1. 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  Johnie Nailer, MD  [facility] [place]

## 2024-03-09 ENCOUNTER — Other Ambulatory Visit: Payer: Self-pay | Admitting: Adult Health

## 2024-03-09 DIAGNOSIS — F3162 Bipolar disorder, current episode mixed, moderate: Secondary | ICD-10-CM

## 2024-03-16 ENCOUNTER — Other Ambulatory Visit: Payer: Self-pay | Admitting: Adult Health

## 2024-03-16 DIAGNOSIS — G4700 Insomnia, unspecified: Secondary | ICD-10-CM

## 2024-03-17 ENCOUNTER — Other Ambulatory Visit: Payer: Self-pay

## 2024-03-17 ENCOUNTER — Telehealth: Payer: Self-pay

## 2024-03-17 DIAGNOSIS — F3162 Bipolar disorder, current episode mixed, moderate: Secondary | ICD-10-CM

## 2024-03-17 DIAGNOSIS — G4700 Insomnia, unspecified: Secondary | ICD-10-CM

## 2024-03-17 MED ORDER — QUETIAPINE FUMARATE 400 MG PO TABS: ORAL_TABLET | ORAL | 0 refills | Status: DC

## 2024-03-24 ENCOUNTER — Ambulatory Visit: Payer: Self-pay

## 2024-03-24 NOTE — Telephone Encounter (Signed)
 FYI Only or Action Required?: FYI only for provider.  Patient was last seen in primary care on 01/07/2024 by Elnor Lauraine BRAVO, NP.  Called Nurse Triage reporting Cyst.  Symptoms began a week ago.  Interventions attempted: Rest, hydration, or home remedies.  Symptoms are: unchanged.  Triage Disposition: See Physician Within 24 Hours  Patient/caregiver understands and will follow disposition?: Yes  Copied from CRM [id number]. Topic: Clinical - Red Word Triage >> Mar 24, 2024 11:20 AM Jasmin G wrote: [facility] that prompted transfer to Nurse Triage: Patient has developed a cyst on lower back, it seems like it was treated before but it reappeared. Pt. is experiencing pain, redness, swollenness around it. Reason for Disposition  [1] Spreading redness around the boil AND [2] no fever  Answer Assessment - Initial Assessment Questions 1. APPEARANCE of BOIL: What does the boil look like?      Redness and swelling 2. LOCATION: Where is the boil located?      Top of sacrum 3. NUMBER: How many boils are there?      one 4. SIZE: How big is the boil? (e.g., inches, cm; compare to size of a coin or other object)     Size of a dime 5. ONSET: When did the boil start?     Within the last week-patient is thinking possible over the weekend.  6. PAIN: Is there any pain? If Yes, ask: How bad is the pain?   (Scale 1-10; or mild, moderate, severe)     3-4 out of 10 7. FEVER: Do you have a fever? If Yes, ask: What is it, how was it measured, and when did it start?      no 8. SOURCE: Have you been around anyone with boils or other Staph infections? Have you ever had boils before?     Yes-patient reports having a cyst in the same area about seven years ago 9. OTHER SYMPTOMS: Do you have any other symptoms? (e.g., shaking chills, weakness, rash elsewhere on body)     Warmth to the area  Protocols used: Boil (Skin Abscess)-A-AH

## 2024-03-25 ENCOUNTER — Ambulatory Visit: Admitting: Internal Medicine

## 2024-03-25 VITALS — BP 120/62 | HR 73 | Temp 98.2°F | Ht 65.0 in | Wt 196.2 lb

## 2024-03-25 DIAGNOSIS — L739 Follicular disorder, unspecified: Secondary | ICD-10-CM

## 2024-03-25 DIAGNOSIS — L0591 Pilonidal cyst without abscess: Secondary | ICD-10-CM | POA: Diagnosis not present

## 2024-03-25 DIAGNOSIS — F3162 Bipolar disorder, current episode mixed, moderate: Secondary | ICD-10-CM

## 2024-03-25 MED ORDER — DOXYCYCLINE HYCLATE 100 MG PO TABS: 100.0000 mg | ORAL_TABLET | Freq: Two times a day (BID) | ORAL | 0 refills | Status: DC

## 2024-03-25 NOTE — Patient Instructions (Signed)
Please take all new medication as prescribed - the antibiotic  Please continue all other medications as before, and refills have been done if requested.  Please have the pharmacy call with any other refills you may need.  Please keep your appointments with your specialists as you may have planned  You will be contacted regarding the referral for: [facility]

## 2024-03-25 NOTE — Progress Notes (Signed)
 Patient ID: Jordan Christian, male   DOB: 19-Jul-1996, 28 y.o.   MRN: [id number]        Chief Complaint: follow up recurrent pilonidal cyst, folliculitis, bipolar       HPI:  Jordan Christian is a 28 y.o. male here with c/o recurrence it seems of a cystic type swelling at the proximal natal cleft,  Years ago this was lanced but now seems to have returned.  Has mild pain, swelling, redness, also with incidentally recurrence of folliculitis rash mostly to legs but some to torso and arms, which has required antibiotic to clear. Pt denies chest pain, increased sob or doe, wheezing, orthopnea, PND, increased LE swelling, palpitations, dizziness or syncope.   Pt denies polydipsia, polyuria, or new focal neuro s/s.      Denies worsening depressive symptoms, suicidal ideation, or panic; conts to follow with psychiatry       Wt Readings from Last 3 Encounters:  03/25/24 196 lb 4 oz (89 kg)  01/07/24 201 lb (91.2 kg)  11/26/23 210 lb (95.3 kg)   BP Readings from Last 3 Encounters:  03/25/24 120/62  01/29/24 109/68  01/07/24 120/78         Past Medical History:  Diagnosis Date   ADHD    Depression    GERD (gastroesophageal reflux disease)    Past Surgical History:  Procedure Laterality Date   WISDOM TOOTH EXTRACTION      reports that he has quit smoking. His smoking use included cigarettes and e-cigarettes. He has never used smokeless tobacco. He reports current drug use. Drug: Marijuana. He reports that he does not drink alcohol. family history includes Alcohol abuse in his father; Bipolar disorder in his mother and sister. He was adopted. No Known Allergies Current Outpatient Medications on File Prior to Visit  Medication Sig Dispense Refill   cetirizine  (ZYRTEC  ALLERGY) 10 MG tablet Take 1 tablet (10 mg total) by mouth daily. 30 tablet 0   divalproex  (DEPAKOTE  ER) 250 MG 24 hr tablet TAKE 1 TABLET BY MOUTH TWICE DAILY 60 tablet 0   divalproex  (DEPAKOTE  ER) 500 MG 24 hr tablet TAKE 1 TABLET BY  MOUTH TWICE DAILY 60 tablet 0   fluticasone  (FLONASE ) 50 MCG/ACT nasal spray Place 1 spray into both nostrils daily for 14 days. 16 g 0   guanFACINE  (INTUNIV ) 2 MG TB24 ER tablet Take 1 tablet (2 mg total) by mouth daily. 30 tablet 2   mirtazapine  (REMERON ) 15 MG tablet TAKE 1 TABLET(15 MG) BY MOUTH AT BEDTIME 90 tablet 0   Multiple Vitamin (MULTIVITAMIN WITH MINERALS) TABS tablet Take 1 tablet by mouth daily.     QUEtiapine  (SEROQUEL ) 400 MG tablet Take one tablet at bedtime. 30 tablet 0   No current facility-administered medications on file prior to visit.        ROS:  All others reviewed and negative.  Objective        PE:  BP 120/62   Pulse 73   Temp 98.2 F (36.8 C) (Temporal)   Ht 5' 5 (1.651 m)   Wt 196 lb 4 oz (89 kg)   SpO2 97%   BMI 32.66 kg/m                 Constitutional: Pt appears in NAD               HENT: Head: NCAT.                Right Ear: External  ear normal.                 Left Ear: External ear normal.                Eyes: . Pupils are equal, round, and reactive to light. Conjunctivae and EOM are normal               Nose: without d/c or deformity               Neck: Neck supple. Gross normal ROM               Cardiovascular: Normal rate and regular rhythm.                 Pulmonary/Chest: Effort normal and breath sounds without rales or wheezing.                Abd:  Soft, NT, ND, + BS, no organomegaly;  [place] cleft with 1 cm raised cystic type lesion it seems with mild erythema and tender, no drainage               Neurological: Pt is alert. At baseline orientation, motor grossly intact               Skin: Skin is warm. LE edema - none, has large areas of numerous very small red tender spots at base of hairs to legs, torso and few to arms.                 Psychiatric: Pt behavior is normal without agitation , mod nervous  Micro: none  Cardiac tracings I have personally interpreted today:  none  Pertinent Radiological findings (summarize): none    Lab Results  Component Value Date   WBC 4.5 12/03/2023   HGB 14.1 12/03/2023   HCT 42.1 12/03/2023   PLT 249.0 12/03/2023   GLUCOSE 79 12/03/2023   CHOL 157 12/03/2023   TRIG 54.0 12/03/2023   HDL 35.10 (L) 12/03/2023   LDLCALC 111 (H) 12/03/2023   ALT 20 12/03/2023   AST 26 12/03/2023   NA 137 12/03/2023   K 4.8 12/03/2023   CL 101 12/03/2023   CREATININE 1.06 12/03/2023   BUN 17 12/03/2023   CO2 29 12/03/2023   TSH 1.33 12/03/2023   PSA 0.51 12/03/2023   HGBA1C 5.4 12/03/2023   Assessment/Plan:  Jordan Christian is a 28 y.o. White or Caucasian [1] male with  has a past medical history of ADHD, Depression, and GERD (gastroesophageal reflux disease).  Pilonidal cyst of natal cleft Small non draining but enlarging - for referral [facility] for removal  Folliculitis Mild to mod, for antibx course doxycycline  100 bid course,,  to f/u any worsening symptoms or concerns  Bipolar 1 disorder, mixed, moderate (HCC) Overall mild to mod nervous today, pt states overall stable and cont's to follow with psychiatry  Followup: Return if symptoms worsen or fail to improve.  Jordan Rush, MD 03/26/2024 12:24 PM [facility]

## 2024-03-26 ENCOUNTER — Encounter: Payer: Self-pay | Admitting: Internal Medicine

## 2024-03-26 DIAGNOSIS — L739 Follicular disorder, unspecified: Secondary | ICD-10-CM | POA: Insufficient documentation

## 2024-03-26 DIAGNOSIS — L0591 Pilonidal cyst without abscess: Secondary | ICD-10-CM | POA: Insufficient documentation

## 2024-03-26 NOTE — Assessment & Plan Note (Signed)
 Mild to mod, for antibx course doxycycline  100 bid course,,  to f/u any worsening symptoms or concerns

## 2024-03-26 NOTE — Assessment & Plan Note (Signed)
 Overall mild to mod nervous today, pt states overall stable and cont's to follow with psychiatry

## 2024-03-26 NOTE — Assessment & Plan Note (Signed)
 Small non draining but enlarging - for referral [facility] for removal

## 2024-04-21 NOTE — Telephone Encounter (Signed)
 [name]

## 2024-04-25 ENCOUNTER — Telehealth: Payer: Self-pay

## 2024-04-25 NOTE — Telephone Encounter (Signed)
Please call to schedule FU, was due in June.

## 2024-04-25 NOTE — Telephone Encounter (Signed)
 LVM TO R/S

## 2024-04-27 ENCOUNTER — Other Ambulatory Visit

## 2024-05-04 ENCOUNTER — Ambulatory Visit: Admitting: Urology

## 2024-06-09 ENCOUNTER — Telehealth: Admitting: Adult Health

## 2024-06-09 ENCOUNTER — Encounter: Payer: Self-pay | Admitting: Adult Health

## 2024-06-09 DIAGNOSIS — F411 Generalized anxiety disorder: Secondary | ICD-10-CM

## 2024-06-09 DIAGNOSIS — F319 Bipolar disorder, unspecified: Secondary | ICD-10-CM

## 2024-06-09 DIAGNOSIS — F909 Attention-deficit hyperactivity disorder, unspecified type: Secondary | ICD-10-CM | POA: Diagnosis not present

## 2024-06-09 DIAGNOSIS — F3162 Bipolar disorder, current episode mixed, moderate: Secondary | ICD-10-CM

## 2024-06-09 DIAGNOSIS — Z79899 Other long term (current) drug therapy: Secondary | ICD-10-CM

## 2024-06-09 DIAGNOSIS — G4700 Insomnia, unspecified: Secondary | ICD-10-CM | POA: Diagnosis not present

## 2024-06-09 MED ORDER — QUETIAPINE FUMARATE 400 MG PO TABS: ORAL_TABLET | ORAL | 2 refills | Status: DC

## 2024-06-09 MED ORDER — DIVALPROEX SODIUM ER 500 MG PO TB24: 500.0000 mg | ORAL_TABLET | Freq: Two times a day (BID) | ORAL | 0 refills | Status: DC

## 2024-06-09 MED ORDER — MIRTAZAPINE 15 MG PO TABS: 15.0000 mg | ORAL_TABLET | Freq: Every day | ORAL | 2 refills | Status: DC

## 2024-06-09 MED ORDER — DIVALPROEX SODIUM ER 250 MG PO TB24: 250.0000 mg | ORAL_TABLET | Freq: Two times a day (BID) | ORAL | 2 refills | Status: DC

## 2024-06-09 MED ORDER — GUANFACINE HCL ER 2 MG PO TB24: 2.0000 mg | ORAL_TABLET | Freq: Every day | ORAL | 2 refills | Status: DC

## 2024-06-09 NOTE — Progress Notes (Signed)
 Jordan Christian [id number] March 16, 1996 28 y.o.  Virtual Visit via Video Note  I connected with pt @ on 06/09/24 at  4:30 PM EDT by a video enabled telemedicine application and verified that I am speaking with the correct person using two identifiers.   I discussed the limitations of evaluation and management by telemedicine and the availability of in person appointments. The patient expressed understanding and agreed to proceed.  I discussed the assessment and treatment plan with the patient. The patient was provided an opportunity to ask questions and all were answered. The patient agreed with the plan and demonstrated an understanding of the instructions.   The patient was advised to call back or seek an in-person evaluation if the symptoms worsen or if the condition fails to improve as anticipated.  I provided 25 minutes of non-face-to-face time during this encounter.  The patient was located at home.  The provider was located at [facility].   Jordan LOISE Sayers, NP   Subjective:   Patient ID:  Jordan Christian is a 28 y.o. (DOB [birth year]) male.  Chief Complaint: No chief complaint on file.   HPI Jordan Christian presents for follow-up of GAD, ADHD, BPD-1 and insomnia.  Describes mood today as ok. Pleasant. Reports tearfulness at times. Mood symptoms - reports depression - every now and then, but works through it. Reports improved interest and motivation. Reports situational anxiety - work related - difficulties getting to work on time. Reports irritability at times - when I misplace something. Denies recent panic attacks. Denies recent outbursts. Reports feeling more positive. Reports mood has been stable. Stating I feel like I've been doing pretty well.Taking medications as prescribed. Energy levels stable. Active, has a regular exercise routine. Enjoys some usual interests and activities. Lives with wife - pets. Spending time with family. Appetite adequate. Weight  loss - 185 from 200 pounds. Sleeps better some nights than others. Averages 5 to 6 hours of broken sleep. Reports focus and concentration difficulties - being tired and having brain fog. Completing tasks. Managing aspects of household. Working full time. Denies SI or HI.  Denies AH or VH. Denies self harm. Reports THC use Reports alcohol use - 2 weeks ago.  Working with a therapist - Medford Fischer.  Previous medication trials: Celexa , Remeron , Concerta , Intuniv , Stratera, Wellbutrin     Review of Systems:  Review of Systems  Musculoskeletal:  Negative for gait problem.  Neurological:  Negative for tremors.  Psychiatric/Behavioral:         Please refer to HPI    Medications: I have reviewed the patient's current medications.  Current Outpatient Medications  Medication Sig Dispense Refill   cetirizine  (ZYRTEC  ALLERGY) 10 MG tablet Take 1 tablet (10 mg total) by mouth daily. 30 tablet 0   divalproex  (DEPAKOTE  ER) 250 MG 24 hr tablet TAKE 1 TABLET BY MOUTH TWICE DAILY 60 tablet 0   divalproex  (DEPAKOTE  ER) 500 MG 24 hr tablet TAKE 1 TABLET BY MOUTH TWICE DAILY 60 tablet 0   doxycycline  (VIBRA -TABS) 100 MG tablet Take 1 tablet (100 mg total) by mouth 2 (two) times daily. 20 tablet 0   fluticasone  (FLONASE ) 50 MCG/ACT nasal spray Place 1 spray into both nostrils daily for 14 days. 16 g 0   guanFACINE  (INTUNIV ) 2 MG TB24 ER tablet Take 1 tablet (2 mg total) by mouth daily. 30 tablet 2   mirtazapine  (REMERON ) 15 MG tablet TAKE 1 TABLET(15 MG) BY MOUTH AT BEDTIME 90 tablet 0   Multiple  Vitamin (MULTIVITAMIN WITH MINERALS) TABS tablet Take 1 tablet by mouth daily.     QUEtiapine  (SEROQUEL ) 400 MG tablet Take one tablet at bedtime. 30 tablet 0   No current facility-administered medications for this visit.    Medication Side Effects: None  Allergies: No Known Allergies  Past Medical History:  Diagnosis Date   ADHD    Depression    GERD (gastroesophageal reflux disease)      Family History  Adopted: Yes  Problem Relation Age of Onset   Bipolar disorder Mother    Bipolar disorder Sister    Alcohol abuse Father     Social History   Socioeconomic History   Marital status: Single    Spouse name: Not on file   Number of children: Not on file   Years of education: Not on file   Highest education level: Not on file  Occupational History   Occupation: Personnel officer  Tobacco Use   Smoking status: Former    Types: Cigarettes, E-cigarettes   Smokeless tobacco: Never  Vaping Use   Vaping status: Every Day  Substance and Sexual Activity   Alcohol use: No    Alcohol/week: 1.0 - 2.0 standard drink of alcohol    Types: 1 - 2 Cans of beer per week    Comment: occ   Drug use: Yes    Types: Marijuana    Comment: occ   Sexual activity: Yes    Partners: Female    Birth control/protection: None  Other Topics Concern   Not on file  Social History Narrative   Not on file   Social Drivers of Health   Financial Resource Strain: Not on file  Food Insecurity: Not on file  Transportation Needs: Not on file  Physical Activity: Not on file  Stress: Not on file  Social Connections: Not on file  Intimate Partner Violence: Not on file    Past Medical History, Surgical history, Social history, and Family history were reviewed and updated as appropriate.   Please see review of systems for further details on the patient's review from today.   Objective:   Physical Exam:  There were no vitals taken for this visit.  Physical Exam Constitutional:      General: He is not in acute distress. Musculoskeletal:        General: No deformity.  Neurological:     Mental Status: He is alert and oriented to person, place, and time.     Coordination: Coordination normal.  Psychiatric:        Attention and Perception: Attention and perception normal. He does not perceive auditory or visual hallucinations.        Mood and Affect: Mood normal. Mood is not anxious or  depressed. Affect is not labile, blunt, angry or inappropriate.        Speech: Speech normal.        Behavior: Behavior normal.        Thought Content: Thought content normal. Thought content is not paranoid or delusional. Thought content does not include homicidal or suicidal ideation. Thought content does not include homicidal or suicidal plan.        Cognition and Memory: Cognition and memory normal.        Judgment: Judgment normal.     Comments: Insight intact     Lab Review:     Component Value Date/Time   NA 137 12/03/2023 0802   NA 140 03/16/2023 1028   K 4.8 12/03/2023 0802   CL 101  12/03/2023 0802   CO2 29 12/03/2023 0802   GLUCOSE 79 12/03/2023 0802   BUN 17 12/03/2023 0802   BUN 10 03/16/2023 1028   CREATININE 1.06 12/03/2023 0802   CALCIUM 9.3 12/03/2023 0802   PROT 6.8 12/03/2023 0802   PROT 7.1 03/16/2023 1028   ALBUMIN 4.7 12/03/2023 0802   ALBUMIN 4.8 03/16/2023 1028   AST 26 12/03/2023 0802   ALT 20 12/03/2023 0802   ALKPHOS 43 12/03/2023 0802   BILITOT 0.3 12/03/2023 0802   BILITOT <0.2 03/16/2023 1028   GFRNONAA >60 07/24/2022 1527   GFRAA 137 03/14/2017 1350       Component Value Date/Time   WBC 4.5 12/03/2023 0802   RBC 4.65 12/03/2023 0802   HGB 14.1 12/03/2023 0802   HGB 13.8 07/06/2017 1615   HCT 42.1 12/03/2023 0802   HCT 40.3 07/06/2017 1615   PLT 249.0 12/03/2023 0802   PLT 224 07/06/2017 1615   MCV 90.4 12/03/2023 0802   MCV 88 07/06/2017 1615   MCH 29.9 07/24/2022 1527   MCHC 33.5 12/03/2023 0802   RDW 15.9 (H) 12/03/2023 0802   RDW 13.5 07/06/2017 1615   LYMPHSABS 2.1 07/24/2022 1527   LYMPHSABS 1.8 07/06/2017 1615   MONOABS 0.4 07/24/2022 1527   EOSABS 0.1 07/24/2022 1527   EOSABS 0.2 07/06/2017 1615   BASOSABS 0.0 07/24/2022 1527   BASOSABS 0.0 07/06/2017 1615    No results found for: POCLITH, LITHIUM   Lab Results  Component Value Date   VALPROATE 50 03/16/2023     .res Assessment: Plan:   Plan:  PDMP  reviewed  Plan: Intuniv  2mg  at hs. Remeron  15mg  at hs Seroquel  400mg  at bedtime. Depakote  750mg  BID - 500/250  RTC 2 months  25 minutes spent dedicated to the care of this patient on the date of this encounter to include pre-visit review of records, ordering of medication, post visit documentation, and face-to-face time with the patient discussing GAD, ADHD, BPD-1 and insomnia. Will request Depakote  level.  Discussed potential benefits, risks, and side effects of Depakote  and need for periodic lab monitoring to assess for potential adverse effects to include obtaining CBC and LFTs. Will request labs at next visit.  Patient advised to contact office with any questions, adverse effects, or acute worsening in signs and symptoms.  Discussed potential metabolic side effects associated with atypical antipsychotics, as well as potential risk for movement side effects. Advised pt to contact office if movement side effects occur.  There are no diagnoses linked to this encounter.   Please see After Visit Summary for patient specific instructions.  Future Appointments  Date Time Provider Department Center  07/14/2024  8:00 AM Elnor Lauraine BRAVO, NP [facility]    No orders of the defined types were placed in this encounter.     -------------------------------

## 2024-06-10 ENCOUNTER — Other Ambulatory Visit: Payer: Self-pay | Admitting: Adult Health

## 2024-06-10 DIAGNOSIS — F3162 Bipolar disorder, current episode mixed, moderate: Secondary | ICD-10-CM

## 2024-06-17 ENCOUNTER — Telehealth: Payer: Self-pay

## 2024-06-17 NOTE — Telephone Encounter (Signed)
 Copied from CRM [id number]. Topic: Referral - Request for Referral >> Jun 17, 2024  2:10 PM Alfonso ORN wrote: Did the patient discuss referral with their provider in the last year? Yes  Appointment offered? Yes  Type of order/referral and detailed reason for visit: ear nose and throat specialist  Preference of office, provider, location:  or [place]   If referral order, have you been seen by this specialty before? Yes (If Yes, this issue or another issue? When? Where? 5 years ago   Can we respond through MyChart? Yes

## 2024-07-14 ENCOUNTER — Ambulatory Visit: Admitting: Nurse Practitioner

## 2024-07-14 VITALS — BP 108/70 | HR 62 | Temp 98.2°F | Ht 65.0 in | Wt 190.5 lb

## 2024-07-14 DIAGNOSIS — E785 Hyperlipidemia, unspecified: Secondary | ICD-10-CM | POA: Diagnosis not present

## 2024-07-14 DIAGNOSIS — M779 Enthesopathy, unspecified: Secondary | ICD-10-CM | POA: Diagnosis not present

## 2024-07-14 DIAGNOSIS — Z23 Encounter for immunization: Secondary | ICD-10-CM | POA: Insufficient documentation

## 2024-07-14 DIAGNOSIS — J310 Chronic rhinitis: Secondary | ICD-10-CM

## 2024-07-14 NOTE — Progress Notes (Signed)
 Established Patient Office Visit  Subjective   Patient ID: Jordan Christian, male    DOB: 10/20/1995  Age: 28 y.o. MRN: [id number]  Chief Complaint  Patient presents with   Hyperlipidemia    Discussed the use of AI scribe software for clinical note transcription with the patient, who gave verbal consent to proceed.  History of Present Illness Jordan Christian is a 28 year old male who presents for a follow-up visit regarding nasal spray use and potential ENT referral.  Nasal congestion and dependence on nasal spray - Chronic use of [employer] version of Afrin nasal spray for several years - Significant difficulty breathing and sensation of pressure in the head when attempting to discontinue nasal spray - Attempts to stop nasal spray use result in stress and disrupted sleep due to buildup of pressure and inability to rest  HLD -Diet controlled -Last LDL 111 -Has lost 10 lbs and has made changes to diet (reduction in processed carbs)  Tendonitis -Has increased physical activity. Reports recent pain in heel, thinks it may have been due to overuse -Would like to see specialist to help prevent new/further injury      Review of Systems  HENT:  Positive for congestion.   Respiratory:  Negative for cough and shortness of breath.   Cardiovascular:  Negative for chest pain and palpitations.      Objective:     BP 108/70   Pulse 62   Temp 98.2 F (36.8 C) (Temporal)   Ht 5' 5 (1.651 m)   Wt 190 lb 8 oz (86.4 kg)   SpO2 99%   BMI 31.70 kg/m  BP Readings from Last 3 Encounters:  07/14/24 108/70  03/25/24 120/62  01/29/24 109/68   Wt Readings from Last 3 Encounters:  07/14/24 190 lb 8 oz (86.4 kg)  03/25/24 196 lb 4 oz (89 kg)  01/07/24 201 lb (91.2 kg)      Physical Exam Vitals reviewed.  Constitutional:      Appearance: Normal appearance.  HENT:     Head: Normocephalic and atraumatic.  Cardiovascular:     Rate and Rhythm: Normal rate and regular rhythm.   Pulmonary:     Effort: Pulmonary effort is normal.     Breath sounds: Normal breath sounds.  Musculoskeletal:     Cervical back: Neck supple.  Skin:    General: Skin is warm and dry.  Neurological:     Mental Status: He is alert and oriented to person, place, and time.  Psychiatric:        Mood and Affect: Mood normal.        Behavior: Behavior normal.        Thought Content: Thought content normal.        Judgment: Judgment normal.      No results found for any visits on 07/14/24.    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 Discontinuation attempts failed due to rebound symptoms. - Referred to ENT for evaluation and management.      Relevant Orders   Ambulatory referral to ENT     Musculoskeletal  and Integument   Tendonitis   Left Achilles tendinitis, improved Left Achilles tendinitis improved with rest and new shoes. - Referred to sports medicine for evaluation and injury prevention. - Advised scheduling with sports medicine for further guidance.      Relevant Orders   Ambulatory referral to Sports Medicine     Other   HLD (hyperlipidemia) - Primary   Hyperlipidemia, diet controlled Hyperlipidemia with LDL at 111 mg/dL managed by diet and weight loss. Patient would like to see if lifestyle changes have improved cholesterol levels. Patient is not fasting today - Ordered fasting lipid panel for patient to have collected at next availability - Advised continuation of dietary modifications and weight management.      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 Discontinuation attempts failed due to rebound symptoms. - Referred to ENT for evaluation and management.  Hyperlipidemia, diet controlled Hyperlipidemia with LDL at 111 mg/dL managed by diet and weight loss. Patient would like to see if lifestyle changes have improved cholesterol levels. Patient is not fasting today - Ordered fasting lipid panel for patient to have collected at next availability - Advised continuation of dietary modifications and weight management.  Left Achilles tendinitis, improved Left Achilles tendinitis improved with rest and new shoes. - Referred to sports medicine for evaluation and injury prevention. - Advised scheduling with sports medicine for further guidance.  Major depressive disorder, bipolar I disorder, and post-traumatic stress disorder, stable on current regimen MDD, bipolar I, and PTSD stable on Depakote , mirtazapine , and Seroquel . Behavioral health follow-up ongoing. - Continue current medication regimen. - Follow up with behavioral health as scheduled.  Acne, stable Acne well-controlled with no recent flare-ups. - Continue current management and monitor for changes.  Flu shot -Declined flu shot today.     Return in about 6 months (around 01/11/2025) for CPE with Lauraine.    Lauraine FORBES Pereyra, NP

## 2024-07-14 NOTE — Assessment & Plan Note (Signed)
 Left Achilles tendinitis, improved Left Achilles tendinitis improved with rest and new shoes. - Referred to sports medicine for evaluation and injury prevention. - Advised scheduling with sports medicine for further guidance.

## 2024-07-14 NOTE — Assessment & Plan Note (Signed)
 Hyperlipidemia, diet controlled Hyperlipidemia with LDL at 111 mg/dL managed by diet and weight loss. Patient would like to see if lifestyle changes have improved cholesterol levels. Patient is not fasting today - Ordered fasting lipid panel for patient to have collected at next availability - Advised continuation of dietary modifications and weight management.

## 2024-07-14 NOTE — Assessment & Plan Note (Signed)
 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 Discontinuation attempts failed due to rebound symptoms. - Referred to ENT for evaluation and management.

## 2024-07-21 ENCOUNTER — Other Ambulatory Visit: Payer: Self-pay | Admitting: Adult Health

## 2024-07-21 DIAGNOSIS — F3162 Bipolar disorder, current episode mixed, moderate: Secondary | ICD-10-CM

## 2024-07-22 NOTE — Progress Notes (Signed)
   LILLETTE Ileana Collet, PhD, LAT, ATC acting as a scribe for Artist Lloyd, MD.  Jordan Christian is a 28 y.o. male who presents to [facility] at [facility] today for L lower leg pain x 2-3 wks, worsening. Pt thinks this pain is related to his recent increase in physical activity, up to 15 mile runs, training for a marathon. Pt locates pain to distal calf and proximal Achilles  Aggravates: running on uneven surfaces and incline Treatments tried: rest, stretching  Pertinent review of systems: No fevers or chills  Relevant historical information: Patient is a proofreader climbing ladders all day long.  He also is a runner running typically about 6 miles with long runs up to 12 miles.  Anticipating a marathon in the spring around March.   Exam:  BP (!) 166/100   Pulse (!) 49   Ht 5' 5 (1.651 m)   Wt 191 lb (86.6 kg)   SpO2 100%   BMI 31.78 kg/m  General: Well Developed, well nourished, and in no acute distress.   MSK: Left calf normal-appearing Normal motion.  Intact strength. Mildly tender palpation around the muscle tendinous junction of the medial head of the gastrocnemius.     Lab and Radiology Results  Diagnostic Limited MSK Ultrasound of: Left calf Blunting of the muscle tendinous junction medial head gastrocnemius evidence of prior injury.  No acute tear or large hematoma is visible.  Achilles tendon is normal-appearing Impression: Prior medial head gastrocnemius strain or tear.      Assessment and Plan: 28 y.o. male with left calf pain.  This is a chronic ongoing issue.  Plan for trial of home exercise program and calf compression sleeve.  If not improved consider formal physical therapy.  Talked about running with this injury. Check back as needed  PDMP not reviewed this encounter. Orders Placed This Encounter  Procedures   US  LIMITED JOINT SPACE STRUCTURES LOW LEFT(NO LINKED CHARGES)    Reason for Exam (SYMPTOM  OR DIAGNOSIS REQUIRED):   left lower leg  pain    Preferred imaging location?:   [facility]   No orders of the defined types were placed in this encounter.    Discussed warning signs or symptoms. Please see discharge instructions. Patient expresses understanding.   The above documentation has been reviewed and is accurate and complete Artist Lloyd, M.D.

## 2024-07-25 ENCOUNTER — Other Ambulatory Visit: Payer: Self-pay | Admitting: Adult Health

## 2024-07-25 ENCOUNTER — Other Ambulatory Visit: Payer: Self-pay

## 2024-07-25 ENCOUNTER — Ambulatory Visit: Admitting: Family Medicine

## 2024-07-25 VITALS — BP 166/100 | HR 49 | Ht 65.0 in | Wt 191.0 lb

## 2024-07-25 DIAGNOSIS — F3162 Bipolar disorder, current episode mixed, moderate: Secondary | ICD-10-CM

## 2024-07-25 DIAGNOSIS — M79662 Pain in left lower leg: Secondary | ICD-10-CM | POA: Diagnosis not present

## 2024-07-25 NOTE — Patient Instructions (Addendum)
 Thank you for coming in today.   Please work on the home exercises the athletic trainer went over with you:  View at my-exercise-[email or web address] code [id number]  Let me know if you are not getting better  I recommend you obtained a compression sleeve to help with your joint problems. There are many options on the market however I recommend obtaining a Full Calf Body Helix compression sleeve.  You can find information (including how to appropriate measure yourself for sizing) can be found at www.Body Grandrapidswifi.ch.  Many of these products are health savings account (HSA) eligible.   You can use the compression sleeve at any time throughout the day but is most important to use while being active as well as for 2 hours post-activity.   It is appropriate to ice following activity with the compression sleeve in place.

## 2024-08-09 ENCOUNTER — Encounter (INDEPENDENT_AMBULATORY_CARE_PROVIDER_SITE_OTHER): Payer: Self-pay | Admitting: Otolaryngology

## 2024-08-09 ENCOUNTER — Ambulatory Visit (INDEPENDENT_AMBULATORY_CARE_PROVIDER_SITE_OTHER): Admitting: Otolaryngology

## 2024-08-09 VITALS — BP 135/82 | HR 73 | Ht 65.0 in | Wt 180.0 lb

## 2024-08-09 DIAGNOSIS — J343 Hypertrophy of nasal turbinates: Secondary | ICD-10-CM

## 2024-08-09 DIAGNOSIS — R0981 Nasal congestion: Secondary | ICD-10-CM | POA: Diagnosis not present

## 2024-08-09 DIAGNOSIS — J3489 Other specified disorders of nose and nasal sinuses: Secondary | ICD-10-CM

## 2024-08-09 DIAGNOSIS — J310 Chronic rhinitis: Secondary | ICD-10-CM

## 2024-08-09 DIAGNOSIS — J342 Deviated nasal septum: Secondary | ICD-10-CM | POA: Diagnosis not present

## 2024-08-09 DIAGNOSIS — T485X5A Adverse effect of other anti-common-cold drugs, initial encounter: Secondary | ICD-10-CM

## 2024-08-09 MED ORDER — PREDNISONE 10 MG PO TABS: ORAL_TABLET | ORAL | 0 refills | Status: AC

## 2024-08-09 NOTE — Patient Instructions (Addendum)
 Take Prednisone  by mouth (PO) 20mg  for 7 days (2 pills), then 10mg  x 7 days (1 pill), then stop. Risks discussed Pour out half the bottle of the spray you're using -- and fill it with flonase  or saline spray. Then use half the bottle for that, and then fill it again with flonase  and saline spray. Do this until you are completely off the spray -- usually about 2-3 weeks.  Aureliano Med Nasal Saline Rinse   - start nasal saline rinses with NeilMed Bottle available over the counter    Nasal Saline Irrigation instructions: If you choose to make your own salt water solution, You will need: Salt (kosher, canning, or pickling salt) Baking soda Nasal irrigation bottle (i.e. Aureliano Med Sinus Rinse) Measuring spoon ( teaspoon) Distilled / boiled water   Mix solution Mix 1 teaspoon of salt, 1/2 teaspoon of baking soda and 1 cup of water into irrigation bottle ** May use saline packet instead of homemade recipe for this step if you prefer If medicine was prescribed to be mixed with solution, place this into bottle Examples 2 inches of 2% mupirocin ointment Budesonide solution Position your head: Lean over sink (about 45 degrees) Rotate head (about 45 degrees) so that one nostril is above the other Irrigate Insert tip of irrigation bottle into upper nostril so it forms a comfortable seal Irrigate while breathing through your mouth May remove the straw from the bottle in order to irrigate the entire solution (important if medicine was added) Exhale through nose when finished and blow nose as necessary  Repeat on opposite side with other 1/2 of solution (120 mL) or remake solution if all 240 mL was used on first side Wash irrigation bottle regularly, replace every 3 months

## 2024-08-09 NOTE — Progress Notes (Signed)
 Dear Dr. Elnor, Here is my assessment for our mutual patient, Jordan Christian. Thank you for allowing me the opportunity to care for your patient. Please do not hesitate to contact me should you have any other questions. Sincerely, Dr. Eldora Blanch  Otolaryngology Clinic Note Referring provider: Dr. Elnor HPI:  Jordan Christian is a 28 y.o. male kindly referred by Dr. Elnor for evaluation of rhinitis  Initial visit (08/2024): Discussed the use of AI scribe software for clinical note transcription with the patient, who gave verbal consent to proceed.  History of Present Illness Jordan Christian is a 28 year old male who present for chronic nasal congestion.  He has used Neosynephrine nasal spray for 5 to 7 years, typically every 30 to 60 minutes, over 20 times per day, starting after a URI. He has been unable to stop.Getting worse.  He has constant nasal pressure and congestion everywhere, all the way around  When he does not have the spray, the congestion can be severe enough to trigger panic attacks.  He has seasonal allergies which are described as mild. He denies prior sinus surgery, frequent sinus infections needing antibiotics or steroids, discolored nasal drainage, or changes in smell.  He tried Flonase  for a few weeks without benefit and has not used other nasal medications.  He feels his nose appears crooked but does not recall nasal fractures. He is not taking blood thinners.  ENT Surgery: no Personal or FHx of bleeding dz or anesthesia difficulty: no  GLP-1: no AP/AC: no  Tobacco: former, quit  PMHx: MDD/PTSD, h/o ETOH use, HLD Independent Review of Additional Tests or Records:  Lauraine Elnor (07/14/2024): noted nasal congestion and dependence on Afrin spray, difficulty breathing and head pressure; Dx: Chronic rhinitis, Rx: ref to ENT  CBC and CMP (12/03/2023): WBC 4.5; Eos (prior) 200; BUN/Cr 17/1.06  CTH 11/29/2010 independently interpreted with respect to sinuses/nasal cavity:  cuts thick so suboptimal and no coronal view but visualized paranasal sinuses clear, septum appears relatively midline  PMH/Meds/All/SocHx/FamHx/ROS:   Past Medical History:  Diagnosis Date   ADHD    Depression    GERD (gastroesophageal reflux disease)      Past Surgical History:  Procedure Laterality Date   WISDOM TOOTH EXTRACTION      Family History  Adopted: Yes  Problem Relation Age of Onset   Bipolar disorder Mother    Bipolar disorder Sister    Alcohol abuse Father      Social Connections: Not on file      Current Outpatient Medications:    cetirizine  (ZYRTEC  ALLERGY) 10 MG tablet, Take 1 tablet (10 mg total) by mouth daily., Disp: 30 tablet, Rfl: 0   divalproex  (DEPAKOTE  ER) 250 MG 24 hr tablet, Take 1 tablet (250 mg total) by mouth 2 (two) times daily., Disp: 60 tablet, Rfl: 2   divalproex  (DEPAKOTE  ER) 500 MG 24 hr tablet, TAKE 1 TABLET(500 MG) BY MOUTH TWICE DAILY, Disp: 60 tablet, Rfl: 0   fluticasone  (FLONASE ) 50 MCG/ACT nasal spray, Place 1 spray into both nostrils daily for 14 days., Disp: 16 g, Rfl: 0   guanFACINE  (INTUNIV ) 2 MG TB24 ER tablet, Take 1 tablet (2 mg total) by mouth daily., Disp: 30 tablet, Rfl: 2   mirtazapine  (REMERON ) 15 MG tablet, Take 1 tablet (15 mg total) by mouth at bedtime., Disp: 30 tablet, Rfl: 2   Multiple Vitamin (MULTIVITAMIN WITH MINERALS) TABS tablet, Take 1 tablet by mouth daily., Disp: , Rfl:    QUEtiapine  (SEROQUEL ) 400  MG tablet, Take one tablet at bedtime., Disp: 30 tablet, Rfl: 2   Physical Exam:   BP 135/82 (BP Location: Left Arm, Patient Position: Sitting, Cuff Size: Large)   Pulse 73   Ht 5' 5 (1.651 m)   Wt 180 lb (81.6 kg)   SpO2 93%   BMI 29.95 kg/m   Salient findings:  CN II-XII intact  Bilateral EAC clear and TM intact with well pneumatized middle ear spaces Anterior rhinoscopy: Septum intact; bilateral inferior turbinates with modest hypertrophy; fairly significant erythema suggestive of rhinitis  medicamentosa; Nasal endoscopy was indicated to better evaluate the nose and paranasal sinuses, given the patient's history and exam findings, and is detailed below.  No lesions of oral cavity/oropharynx No obviously palpable neck masses/lymphadenopathy/thyromegaly No respiratory distress or stridor  Seprately Identifiable Procedures:  Prior to initiating any procedures, risks/benefits/alternatives were explained to the patient and verbal consent obtained. PROCEDURE: Bilateral Diagnostic Rigid Nasal Endoscopy Pre-procedure diagnosis: Concern for nasal congestion, rhinitis medicamentosa Post-procedure diagnosis: same Indication: See pre-procedure diagnosis and physical exam above Complications: None apparent EBL: 0 mL Anesthesia: Lidocaine 4% and topical decongestant was topically sprayed in each nasal cavity  Description of Procedure:  Patient was identified. A rigid 30 degree endoscope was utilized to evaluate the sinonasal cavities, mucosa, sinus ostia and turbinates and septum.  Overall, signs of mucosal inflammation are noted - diffuse erythema suggestive of rhinitis medicamentosa.  No mucopurulence, polyps, or masses noted.   Right Middle meatus: modest mucosal edema Right SE Recess: modest mucosal edema Left MM: modest mucosal edema Left SE Recess: modest mucosal edema Photodocumentation was obtained.  CPT CODE -- 68768 - Mod 25   Impression & Plans:  Jordan Christian is a 28 y.o. male with:  1. Rhinitis medicamentosa   2. Nasal congestion   3. Hypertrophy of both inferior nasal turbinates   4. Nasal obstruction   5. 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 We discussed being off the afrin/alpha 1 before any surgical procedure to address breathing. Does have some septal deviation and ITH as well - Prescribed prednisone  20 mg daily for 1 week, then 10 mg daily for 1 week. - Instructed to mix neosynephrine with Flonase  or saline  to taper use. - Advised Flonase  or saline spray multiple times daily. - Recommended neti pot for nasal irrigation.   - Follow-up in 6-8 weeks  See below regarding exact medications prescribed this encounter including dosages and route: No orders of the defined types were placed in this encounter.     Thank you for allowing me the opportunity to care for your patient. Please do not hesitate to contact me should you have any other questions.  Sincerely, Eldora Blanch, MD Otolaryngologist (ENT), [facility] Phone: [phone] Fax: [phone]  08/09/2024, 11:23 AM   MDM:  Level 4 - [id number] Complexity/Problems addressed: mod - chronic problems, getting worse Data complexity: mod - independent review of note, lab, test - Morbidity: mod  - Prescription Drug prescribed or managed: y

## 2024-08-12 ENCOUNTER — Telehealth: Admitting: Adult Health

## 2024-08-12 ENCOUNTER — Encounter: Payer: Self-pay | Admitting: Adult Health

## 2024-08-12 ENCOUNTER — Other Ambulatory Visit: Payer: Self-pay | Admitting: Adult Health

## 2024-08-12 DIAGNOSIS — G4700 Insomnia, unspecified: Secondary | ICD-10-CM

## 2024-08-12 DIAGNOSIS — F319 Bipolar disorder, unspecified: Secondary | ICD-10-CM | POA: Diagnosis not present

## 2024-08-12 DIAGNOSIS — F3162 Bipolar disorder, current episode mixed, moderate: Secondary | ICD-10-CM

## 2024-08-12 DIAGNOSIS — Z79899 Other long term (current) drug therapy: Secondary | ICD-10-CM

## 2024-08-12 DIAGNOSIS — F411 Generalized anxiety disorder: Secondary | ICD-10-CM | POA: Diagnosis not present

## 2024-08-12 DIAGNOSIS — F909 Attention-deficit hyperactivity disorder, unspecified type: Secondary | ICD-10-CM | POA: Diagnosis not present

## 2024-08-12 MED ORDER — DIVALPROEX SODIUM ER 500 MG PO TB24: 500.0000 mg | ORAL_TABLET | Freq: Every day | ORAL | 0 refills | Status: DC

## 2024-08-12 MED ORDER — GUANFACINE HCL ER 2 MG PO TB24: 2.0000 mg | ORAL_TABLET | Freq: Every day | ORAL | 2 refills | Status: AC

## 2024-08-12 MED ORDER — MIRTAZAPINE 15 MG PO TABS: 15.0000 mg | ORAL_TABLET | Freq: Every day | ORAL | 2 refills | Status: AC

## 2024-08-12 MED ORDER — QUETIAPINE FUMARATE 400 MG PO TABS: ORAL_TABLET | ORAL | 2 refills | Status: AC

## 2024-08-12 MED ORDER — DIVALPROEX SODIUM ER 250 MG PO TB24: 250.0000 mg | ORAL_TABLET | Freq: Two times a day (BID) | ORAL | 0 refills | Status: DC

## 2024-08-12 NOTE — Progress Notes (Signed)
 Jordan Christian [id number] 1995-10-15 28 y.o.  Virtual Visit via Video Note  I connected with pt @ on 08/12/24 at  2:30 PM EST by a video enabled telemedicine application and verified that I am speaking with the correct person using two identifiers.   I discussed the limitations of evaluation and management by telemedicine and the availability of in person appointments. The patient expressed understanding and agreed to proceed.  I discussed the assessment and treatment plan with the patient. The patient was provided an opportunity to ask questions and all were answered. The patient agreed with the plan and demonstrated an understanding of the instructions.   The patient was advised to call back or seek an in-person evaluation if the symptoms worsen or if the condition fails to improve as anticipated.  I provided 25 minutes of non-face-to-face time during this encounter.  The patient was located at home.  The provider was located at [facility].   Angeline LOISE Sayers, NP   Subjective:   Patient ID:  Jordan Christian is a 28 y.o. (DOB [birth year]) male.  Chief Complaint: No chief complaint on file.   HPI Jordan Christian presents for follow-up of GAD, ADHD, BPD-1 and insomnia.  Describes mood today as ok. Pleasant. Reports tearfulness at times. Mood symptoms - reports depression - situational. Reports lower interest and motivation. Reports decreased anxiety. Reports irritability at times. Denies recent panic attacks. Denies recent outbursts. Reports losing 2 cats since last visit. Reports mood is variable - it's up and down. Stating I feel like I've been doing ok.Taking medications as prescribed. Energy levels stable. Active, has a regular exercise routine. Enjoys some usual interests and activities. Lives with wife - pets. Spending time with family. Appetite adequate. Weight loss - 175 from 200 pounds. Sleeps better some nights than others. Averages 5 to 6 hours of broken  sleep. Reports focus and concentration ok. Completing tasks. Managing aspects of household. Works full time. Denies SI or HI.  Denies AH or VH. Denies self harm. Reports THC use Denies alcohol use.  Previous medication trials: Celexa , Remeron , Concerta , Intuniv , Stratera, Wellbutrin    Review of Systems:  Review of Systems  Musculoskeletal:  Negative for gait problem.  Neurological:  Negative for tremors.  Psychiatric/Behavioral:         Please refer to HPI    Medications: I have reviewed the patient's current medications.  Current Outpatient Medications  Medication Sig Dispense Refill   cetirizine  (ZYRTEC  ALLERGY) 10 MG tablet Take 1 tablet (10 mg total) by mouth daily. 30 tablet 0   divalproex  (DEPAKOTE  ER) 250 MG 24 hr tablet Take 1 tablet (250 mg total) by mouth 2 (two) times daily. 60 tablet 2   divalproex  (DEPAKOTE  ER) 500 MG 24 hr tablet TAKE 1 TABLET(500 MG) BY MOUTH TWICE DAILY 60 tablet 0   fluticasone  (FLONASE ) 50 MCG/ACT nasal spray Place 1 spray into both nostrils daily for 14 days. 16 g 0   guanFACINE  (INTUNIV ) 2 MG TB24 ER tablet Take 1 tablet (2 mg total) by mouth daily. 30 tablet 2   mirtazapine  (REMERON ) 15 MG tablet Take 1 tablet (15 mg total) by mouth at bedtime. 30 tablet 2   Multiple Vitamin (MULTIVITAMIN WITH MINERALS) TABS tablet Take 1 tablet by mouth daily.     predniSONE  (DELTASONE ) 10 MG tablet Take 2 tablets (20 mg total) by mouth daily with breakfast for 7 days, THEN 1 tablet (10 mg total) daily with breakfast for 7 days. 21 tablet 0  QUEtiapine  (SEROQUEL ) 400 MG tablet Take one tablet at bedtime. 30 tablet 2   No current facility-administered medications for this visit.    Medication Side Effects: None  Allergies: No Known Allergies  Past Medical History:  Diagnosis Date   ADHD    Depression    GERD (gastroesophageal reflux disease)     Family History  Adopted: Yes  Problem Relation Age of Onset   Bipolar disorder Mother    Bipolar  disorder Sister    Alcohol abuse Father     Social History   Socioeconomic History   Marital status: Single    Spouse name: Not on file   Number of children: Not on file   Years of education: Not on file   Highest education level: Not on file  Occupational History   Occupation: Personnel Officer  Tobacco Use   Smoking status: Former    Types: Cigarettes, E-cigarettes   Smokeless tobacco: Never  Vaping Use   Vaping status: Every Day  Substance and Sexual Activity   Alcohol use: No    Alcohol/week: 1.0 - 2.0 standard drink of alcohol    Types: 1 - 2 Cans of beer per week    Comment: occ   Drug use: Yes    Types: Marijuana    Comment: occ   Sexual activity: Yes    Partners: Female    Birth control/protection: None  Other Topics Concern   Not on file  Social History Narrative   Not on file   Social Drivers of Health   Financial Resource Strain: Not on file  Food Insecurity: Not on file  Transportation Needs: Not on file  Physical Activity: Not on file  Stress: Not on file  Social Connections: Not on file  Intimate Partner Violence: Not on file    Past Medical History, Surgical history, Social history, and Family history were reviewed and updated as appropriate.   Please see review of systems for further details on the patient's review from today.   Objective:   Physical Exam:  There were no vitals taken for this visit.  Physical Exam Constitutional:      General: He is not in acute distress. Musculoskeletal:        General: No deformity.  Neurological:     Mental Status: He is alert and oriented to person, place, and time.     Coordination: Coordination normal.  Psychiatric:        Attention and Perception: Attention and perception normal. He does not perceive auditory or visual hallucinations.        Mood and Affect: Mood normal. Mood is not anxious or depressed. Affect is not labile, blunt, angry or inappropriate.        Speech: Speech normal.         Behavior: Behavior normal.        Thought Content: Thought content normal. Thought content is not paranoid or delusional. Thought content does not include homicidal or suicidal ideation. Thought content does not include homicidal or suicidal plan.        Cognition and Memory: Cognition and memory normal.        Judgment: Judgment normal.     Comments: Insight intact     Lab Review:     Component Value Date/Time   NA 137 12/03/2023 0802   NA 140 03/16/2023 1028   K 4.8 12/03/2023 0802   CL 101 12/03/2023 0802   CO2 29 12/03/2023 0802   GLUCOSE 79 12/03/2023 0802  BUN 17 12/03/2023 0802   BUN 10 03/16/2023 1028   CREATININE 1.06 12/03/2023 0802   CALCIUM 9.3 12/03/2023 0802   PROT 6.8 12/03/2023 0802   PROT 7.1 03/16/2023 1028   ALBUMIN 4.7 12/03/2023 0802   ALBUMIN 4.8 03/16/2023 1028   AST 26 12/03/2023 0802   ALT 20 12/03/2023 0802   ALKPHOS 43 12/03/2023 0802   BILITOT 0.3 12/03/2023 0802   BILITOT <0.2 03/16/2023 1028   GFRNONAA >60 07/24/2022 1527   GFRAA 137 03/14/2017 1350       Component Value Date/Time   WBC 4.5 12/03/2023 0802   RBC 4.65 12/03/2023 0802   HGB 14.1 12/03/2023 0802   HGB 13.8 07/06/2017 1615   HCT 42.1 12/03/2023 0802   HCT 40.3 07/06/2017 1615   PLT 249.0 12/03/2023 0802   PLT 224 07/06/2017 1615   MCV 90.4 12/03/2023 0802   MCV 88 07/06/2017 1615   MCH 29.9 07/24/2022 1527   MCHC 33.5 12/03/2023 0802   RDW 15.9 (H) 12/03/2023 0802   RDW 13.5 07/06/2017 1615   LYMPHSABS 2.1 07/24/2022 1527   LYMPHSABS 1.8 07/06/2017 1615   MONOABS 0.4 07/24/2022 1527   EOSABS 0.1 07/24/2022 1527   EOSABS 0.2 07/06/2017 1615   BASOSABS 0.0 07/24/2022 1527   BASOSABS 0.0 07/06/2017 1615    No results found for: POCLITH, LITHIUM   Lab Results  Component Value Date   VALPROATE 50 03/16/2023     .res Assessment: Plan:     Plan:  PDMP reviewed  Plan: Intuniv  2mg  at hs. Remeron  15mg  at hs Seroquel  400mg  at bedtime. Depakote  750mg  BID  - 500/250  RTC 2 months  25 minutes spent dedicated to the care of this patient on the date of this encounter to include pre-visit review of records, ordering of medication, post visit documentation, and face-to-face time with the patient discussing GAD, ADHD, BPD-1 and insomnia. Will request Depakote  level.  Discussed potential benefits, risks, and side effects of Depakote  and need for periodic lab monitoring to assess for potential adverse effects to include obtaining CBC and LFTs. Will request labs at next visit.  Patient advised to contact office with any questions, adverse effects, or acute worsening in signs and symptoms.  Discussed potential metabolic side effects associated with atypical antipsychotics, as well as potential risk for movement side effects. Advised pt to contact office if movement side effects occur.   There are no diagnoses linked to this encounter.   Please see After Visit Summary for patient specific instructions.  Future Appointments  Date Time Provider Department Center  08/12/2024  2:30 PM Wandalene Abrams Nattalie, NP [facility] None  09/20/2024 10:45 AM Tobie Eldora NOVAK, MD [facility] None  01/12/2025  8:00 AM Elnor Lauraine BRAVO, NP [facility]    No orders of the defined types were placed in this encounter.     -------------------------------

## 2024-09-19 ENCOUNTER — Telehealth (INDEPENDENT_AMBULATORY_CARE_PROVIDER_SITE_OTHER): Payer: Self-pay

## 2024-09-19 NOTE — Telephone Encounter (Signed)
 Patient called 09/19/2024 at 11:40 AM. Patient stated he did not pick up the prednisone  that you had prescribed him at the last appointment. Patient canceled his appointment tomorrow with you until he can take the prescription. Patient asked if [facility]

## 2024-09-19 NOTE — Telephone Encounter (Signed)
 I put the updated pharmacy in his chart.

## 2024-09-20 ENCOUNTER — Ambulatory Visit (INDEPENDENT_AMBULATORY_CARE_PROVIDER_SITE_OTHER): Admitting: Otolaryngology

## 2024-09-20 MED ORDER — PREDNISONE 10 MG PO TABS: ORAL_TABLET | ORAL | 0 refills | Status: AC

## 2024-09-20 NOTE — Telephone Encounter (Signed)
 Done

## 2024-09-20 NOTE — Telephone Encounter (Signed)
 Spoke to patient' wife she stated the [facility] on file in [place] is still good you can send the prescription there.

## 2024-09-20 NOTE — Addendum Note (Signed)
 Addended by: Ying [place] on: 09/20/2024 11:02 AM   Modules accepted: Orders

## 2025-01-12 ENCOUNTER — Encounter: Admitting: Nurse Practitioner

## 2025-01-13 ENCOUNTER — Encounter: Admitting: Nurse Practitioner
